# Patient Record
Sex: Male | Born: 1975 | Race: Black or African American | Hispanic: No | Marital: Married | Smoking: Former smoker
Health system: Southern US, Community
[De-identification: ages and names within clinical notes are randomized; demographics above are authoritative.]

## PROBLEM LIST (undated history)

## (undated) DIAGNOSIS — I10 Essential (primary) hypertension: Secondary | ICD-10-CM

## (undated) DIAGNOSIS — J45909 Unspecified asthma, uncomplicated: Secondary | ICD-10-CM

## (undated) DIAGNOSIS — E119 Type 2 diabetes mellitus without complications: Secondary | ICD-10-CM

## (undated) DIAGNOSIS — M1990 Unspecified osteoarthritis, unspecified site: Secondary | ICD-10-CM

## (undated) DIAGNOSIS — D649 Anemia, unspecified: Secondary | ICD-10-CM

## (undated) DIAGNOSIS — Z9289 Personal history of other medical treatment: Secondary | ICD-10-CM

## (undated) DIAGNOSIS — I209 Angina pectoris, unspecified: Secondary | ICD-10-CM

## (undated) DIAGNOSIS — G4730 Sleep apnea, unspecified: Secondary | ICD-10-CM

## (undated) HISTORY — PX: WISDOM TOOTH EXTRACTION: SHX21

## (undated) HISTORY — PX: BACK SURGERY: SHX140

---

## 1997-11-13 ENCOUNTER — Emergency Department (HOSPITAL_COMMUNITY): Admission: EM | Admit: 1997-11-13 | Discharge: 1997-11-13 | Payer: Self-pay | Admitting: Internal Medicine

## 1999-12-29 ENCOUNTER — Emergency Department (HOSPITAL_COMMUNITY): Admission: EM | Admit: 1999-12-29 | Discharge: 1999-12-29 | Payer: Self-pay | Admitting: Emergency Medicine

## 2003-07-08 HISTORY — PX: TONSILLECTOMY: SUR1361

## 2004-07-28 ENCOUNTER — Ambulatory Visit: Payer: Self-pay | Admitting: Otolaryngology

## 2005-02-02 ENCOUNTER — Emergency Department: Payer: Self-pay | Admitting: Emergency Medicine

## 2005-02-24 ENCOUNTER — Emergency Department: Payer: Self-pay | Admitting: Emergency Medicine

## 2010-10-21 ENCOUNTER — Emergency Department: Payer: Self-pay | Admitting: Emergency Medicine

## 2011-09-18 ENCOUNTER — Ambulatory Visit: Payer: Self-pay | Admitting: Internal Medicine

## 2011-09-18 LAB — LIPID PANEL
HDL Cholesterol: 20 mg/dL — ABNORMAL LOW (ref 40–60)
Triglycerides: 1798 mg/dL — ABNORMAL HIGH (ref 0–200)

## 2011-09-18 LAB — BASIC METABOLIC PANEL
Anion Gap: 16 (ref 7–16)
BUN: 18 mg/dL (ref 7–18)
Calcium, Total: 8.4 mg/dL — ABNORMAL LOW (ref 8.5–10.1)
Co2: 19 mmol/L — ABNORMAL LOW (ref 21–32)
Creatinine: 1.06 mg/dL (ref 0.60–1.30)
EGFR (African American): >60
EGFR (Non-African Amer.): >60
Glucose: 503 mg/dL (ref 65–99)
Sodium: 133 mmol/L — ABNORMAL LOW (ref 136–145)

## 2011-09-18 LAB — HEMOGLOBIN A1C: Hemoglobin A1C: 10.4 % — ABNORMAL HIGH (ref 4.2–6.3)

## 2012-05-28 ENCOUNTER — Other Ambulatory Visit: Payer: Self-pay | Admitting: Neurosurgery

## 2012-05-28 DIAGNOSIS — M48061 Spinal stenosis, lumbar region without neurogenic claudication: Secondary | ICD-10-CM

## 2012-06-04 ENCOUNTER — Ambulatory Visit
Admission: RE | Admit: 2012-06-04 | Discharge: 2012-06-04 | Disposition: A | Discharge status: Home / Self Care | Payer: BC Managed Care – PPO | Source: Ambulatory Visit | Attending: Neurosurgery | Admitting: Neurosurgery

## 2012-06-04 VITALS — BP 85/58 | HR 68 | Ht 74.0 in | Wt 300.0 lb

## 2012-06-04 DIAGNOSIS — M48061 Spinal stenosis, lumbar region without neurogenic claudication: Secondary | ICD-10-CM

## 2012-06-04 MED ORDER — DIAZEPAM 5 MG PO TABS
10.0000 mg | ORAL_TABLET | Freq: Once | ORAL | Status: AC
  Administered 2012-06-04: 10 mg via ORAL

## 2012-06-04 MED ORDER — ONDANSETRON HCL 4 MG/2ML IJ SOLN
4.0000 mg | Freq: Once | INTRAMUSCULAR | Status: AC
  Administered 2012-06-04: 4 mg via INTRAMUSCULAR

## 2012-06-04 MED ORDER — MEPERIDINE HCL 100 MG/ML IJ SOLN
100.0000 mg | Freq: Once | INTRAMUSCULAR | Status: AC
  Administered 2012-06-04: 100 mg via INTRAMUSCULAR

## 2012-06-04 MED ORDER — IOHEXOL 180 MG/ML  SOLN
18.0000 mL | Freq: Once | INTRAMUSCULAR | Status: AC | PRN
  Administered 2012-06-04: 18 mL via INTRATHECAL

## 2012-06-04 MED ORDER — ONDANSETRON HCL 4 MG/2ML IJ SOLN: 4.0000 mg | Freq: Four times a day (QID) | INTRAMUSCULAR | Status: DC | PRN

## 2012-06-04 NOTE — Progress Notes (Signed)
Dr. Karin Golden in to speak with patient and his wife in the nursing station after myelogram.  Donell Sievert, RN

## 2012-06-04 NOTE — Progress Notes (Signed)
Patient states he has been off Tramadol and Cymbalta for the past two days.  jkl

## 2012-06-04 NOTE — Discharge Instructions (Signed)
Myelogram Discharge Instructions  1. Go home and rest quietly for the next 24 hours.  It is important to lie flat for the next 24 hours.  Get up only to go to the restroom.  You may lie in the bed or on a couch on your back, your stomach, your left side or your right side.  You may have one pillow under your head.  You may have pillows between your knees while you are on your side or under your knees while you are on your back.  2. DO NOT drive today.  Recline the seat as far back as it will go, while still wearing your seat belt, on the way home.  3. You may get up to go to the bathroom as needed.  You may sit up for 10 minutes to eat.  You may resume your normal diet and medications unless otherwise indicated.  Drink lots of extra fluids today and tomorrow.  4. The incidence of headache, nausea, or vomiting is about 5% (one in 20 patients).  If you develop a headache, lie flat and drink plenty of fluids until the headache goes away.  Caffeinated beverages may be helpful.  If you develop severe nausea and vomiting or a headache that does not go away with flat bed rest, call [phone].  5. You may resume normal activities after your 24 hours of bed rest is over; however, do not exert yourself strongly or do any heavy lifting tomorrow. If when you get up you have a headache when standing, go back to bed and force fluids for another 24 hours.  6. Call your physician for a follow-up appointment.  The results of your myelogram will be sent directly to your physician by the following day.  7. If you have any questions or if complications develop after you arrive home, please call [phone].  Discharge instructions have been explained to the patient.  The patient, or the person responsible for the patient, fully understands these instructions.       MAY RESUME CYMBALTA AND TRAMADOL ON June 05, 2012, AFTER 9:30 AM.

## 2012-07-06 ENCOUNTER — Other Ambulatory Visit: Payer: Self-pay | Admitting: Neurosurgery

## 2012-07-16 ENCOUNTER — Encounter (HOSPITAL_COMMUNITY): Payer: Self-pay

## 2012-07-20 ENCOUNTER — Encounter (HOSPITAL_COMMUNITY): Payer: Self-pay

## 2012-07-20 ENCOUNTER — Encounter (HOSPITAL_COMMUNITY)
Admission: RE | Admit: 2012-07-20 | Discharge: 2012-07-20 | Disposition: A | Discharge status: Home / Self Care | Payer: BC Managed Care – PPO | Source: Ambulatory Visit | Attending: Neurosurgery | Admitting: Neurosurgery

## 2012-07-20 DIAGNOSIS — Z01812 Encounter for preprocedural laboratory examination: Secondary | ICD-10-CM | POA: Insufficient documentation

## 2012-07-20 DIAGNOSIS — Z01818 Encounter for other preprocedural examination: Secondary | ICD-10-CM | POA: Insufficient documentation

## 2012-07-20 HISTORY — DX: Anemia, unspecified: D64.9

## 2012-07-20 HISTORY — DX: Essential (primary) hypertension: I10

## 2012-07-20 HISTORY — DX: Unspecified osteoarthritis, unspecified site: M19.90

## 2012-07-20 HISTORY — DX: Type 2 diabetes mellitus without complications: E11.9

## 2012-07-20 LAB — CBC WITH DIFFERENTIAL/PLATELET
Basophils Absolute: 0.1 10*3/uL (ref 0.0–0.1)
Basophils Relative: 1 % (ref 0–1)
Eosinophils Absolute: 0.3 10*3/uL (ref 0.0–0.7)
Eosinophils Relative: 4 % (ref 0–5)
HCT: 42.4 % (ref 39.0–52.0)
MCH: 31.2 pg (ref 26.0–34.0)
MCHC: 33.3 g/dL (ref 30.0–36.0)
MCV: 93.8 fL (ref 78.0–100.0)
Monocytes Absolute: 0.6 10*3/uL (ref 0.1–1.0)
Platelets: 326 10*3/uL (ref 150–400)
RDW: 13.2 % (ref 11.5–15.5)

## 2012-07-20 LAB — BASIC METABOLIC PANEL
CO2: 30 mEq/L (ref 19–32)
Calcium: 9 mg/dL (ref 8.4–10.5)
Creatinine, Ser: 1.11 mg/dL (ref 0.50–1.35)
GFR calc non Af Amer: 82 mL/min — ABNORMAL LOW (ref >90)

## 2012-07-20 LAB — TYPE AND SCREEN: ABO/RH(D): O POS

## 2012-07-20 NOTE — Pre-Procedure Instructions (Signed)
[name]  07/20/2012   Your procedure is scheduled on:  07-30-2012  Report to [facility] Stay [facility] at 8:00 AM. Take the [facility] to the 3rd floor  Call this number if you have problems the morning of surgery: [phone]   Remember:   Do not eat food or drink liquids after midnight.              Eat A good snack before bedtime on sunday    Take these medicines the morning of surgery with A SIP OF WATER cymbalta,tramadol if needed   Do not wear jewelry,  Do not wear lotions, powders, or perfumes. You may wear deodorant.  Do not shave 48 hours prior to surgery. Men may shave face and neck.  Do not bring valuables to the hospital.  Contacts, dentures or bridgework may not be worn into surgery.  Leave suitcase in the car. After surgery it may be brought to your room.    For patients admitted to the hospital, checkout time is 11:00 AM the day of discharge.   Patients discharged the day of surgery will not be allowed to drive home.    Special Instructions: Shower using CHG 2 nights before surgery and the night before surgery.  If you shower the day of surgery use CHG.  Use special wash - you have one bottle of CHG for all showers.  You should use approximately 1/3 of the bottle for each shower.   Please read over the following fact sheets that you were given: Pain Booklet, Coughing and Deep Breathing, Blood Transfusion Information and Anesthesia Post-op Instructions

## 2012-07-20 NOTE — Progress Notes (Addendum)
Request ekg and cxr ,stress test requested fromDr. Adrian Blackwater ,[facility], [place]  [phone]

## 2012-07-24 NOTE — Progress Notes (Signed)
Spoke with [facility] records and pt has not had a CXR and last EKG is from 2012. Received the stress test, echo and office visit notes and they will send the EKG that they have.  Will need CXR and EKG DOS...noted on chart

## 2012-07-29 MED ORDER — DEXTROSE 5 % IV SOLN
3.0000 g | INTRAVENOUS | Status: AC
  Administered 2012-07-30: 3 g via INTRAVENOUS
  Filled 2012-07-29: qty 3000

## 2012-07-30 ENCOUNTER — Ambulatory Visit (HOSPITAL_COMMUNITY): Payer: BC Managed Care – PPO | Admitting: Certified Registered"

## 2012-07-30 ENCOUNTER — Ambulatory Visit (HOSPITAL_COMMUNITY): Payer: BC Managed Care – PPO

## 2012-07-30 ENCOUNTER — Encounter (HOSPITAL_COMMUNITY)
Admission: RE | Disposition: A | Discharge status: Home / Self Care | Payer: Self-pay | Source: Ambulatory Visit | Attending: Neurosurgery

## 2012-07-30 ENCOUNTER — Encounter (HOSPITAL_COMMUNITY): Payer: Self-pay | Admitting: Surgery

## 2012-07-30 ENCOUNTER — Inpatient Hospital Stay (HOSPITAL_COMMUNITY)
Admission: RE | Admit: 2012-07-30 | Discharge: 2012-08-02 | DRG: 756 | Disposition: A | Discharge status: Home / Self Care | Payer: BC Managed Care – PPO | Source: Ambulatory Visit | Attending: Neurosurgery | Admitting: Neurosurgery

## 2012-07-30 ENCOUNTER — Encounter (HOSPITAL_COMMUNITY): Payer: Self-pay | Admitting: Certified Registered"

## 2012-07-30 DIAGNOSIS — Z9089 Acquired absence of other organs: Secondary | ICD-10-CM

## 2012-07-30 DIAGNOSIS — M129 Arthropathy, unspecified: Secondary | ICD-10-CM | POA: Diagnosis present

## 2012-07-30 DIAGNOSIS — Z7982 Long term (current) use of aspirin: Secondary | ICD-10-CM

## 2012-07-30 DIAGNOSIS — F17200 Nicotine dependence, unspecified, uncomplicated: Secondary | ICD-10-CM | POA: Diagnosis present

## 2012-07-30 DIAGNOSIS — E119 Type 2 diabetes mellitus without complications: Secondary | ICD-10-CM | POA: Diagnosis present

## 2012-07-30 DIAGNOSIS — Z79899 Other long term (current) drug therapy: Secondary | ICD-10-CM

## 2012-07-30 DIAGNOSIS — I10 Essential (primary) hypertension: Secondary | ICD-10-CM | POA: Diagnosis present

## 2012-07-30 DIAGNOSIS — M48062 Spinal stenosis, lumbar region with neurogenic claudication: Principal | ICD-10-CM | POA: Diagnosis present

## 2012-07-30 LAB — GLUCOSE, CAPILLARY: Glucose-Capillary: 146 mg/dL — ABNORMAL HIGH (ref 70–99)

## 2012-07-30 SURGERY — POSTERIOR LUMBAR FUSION 2 LEVEL
Anesthesia: General | Site: Back | Wound class: Clean

## 2012-07-30 MED ORDER — ROCURONIUM BROMIDE 100 MG/10ML IV SOLN
INTRAVENOUS | Status: DC | PRN
  Administered 2012-07-30: 50 mg via INTRAVENOUS

## 2012-07-30 MED ORDER — 0.9 % SODIUM CHLORIDE (POUR BTL) OPTIME
TOPICAL | Status: DC | PRN
  Administered 2012-07-30: 1000 mL

## 2012-07-30 MED ORDER — CEFAZOLIN SODIUM 1-5 GM-% IV SOLN
1.0000 g | Freq: Three times a day (TID) | INTRAVENOUS | Status: AC
  Administered 2012-07-30 – 2012-07-31 (×2): 1 g via INTRAVENOUS
  Filled 2012-07-30 (×2): qty 50

## 2012-07-30 MED ORDER — FLEET ENEMA 7-19 GM/118ML RE ENEM
1.0000 | ENEMA | Freq: Once | RECTAL | Status: AC | PRN
  Filled 2012-07-30: qty 1

## 2012-07-30 MED ORDER — PHENOL 1.4 % MT LIQD: 1.0000 | OROMUCOSAL | Status: DC | PRN

## 2012-07-30 MED ORDER — ONDANSETRON HCL 4 MG/2ML IJ SOLN: 4.0000 mg | Freq: Once | INTRAMUSCULAR | Status: DC | PRN

## 2012-07-30 MED ORDER — DIAZEPAM 5 MG PO TABS
5.0000 mg | ORAL_TABLET | Freq: Four times a day (QID) | ORAL | Status: DC | PRN
  Administered 2012-07-30: 5 mg via ORAL
  Administered 2012-07-31 – 2012-08-02 (×8): 10 mg via ORAL
  Filled 2012-07-30 (×7): qty 2
  Filled 2012-07-30: qty 1
  Filled 2012-07-30: qty 2

## 2012-07-30 MED ORDER — ONDANSETRON HCL 4 MG/2ML IJ SOLN
INTRAMUSCULAR | Status: DC | PRN
  Administered 2012-07-30: 4 mg via INTRAVENOUS

## 2012-07-30 MED ORDER — NEOSTIGMINE METHYLSULFATE 1 MG/ML IJ SOLN
INTRAMUSCULAR | Status: DC | PRN
  Administered 2012-07-30: 2.5 mg via INTRAVENOUS

## 2012-07-30 MED ORDER — SODIUM CHLORIDE 0.9 % IV SOLN: 250.0000 mL | INTRAVENOUS | Status: DC

## 2012-07-30 MED ORDER — LIDOCAINE HCL (CARDIAC) 20 MG/ML IV SOLN
INTRAVENOUS | Status: DC | PRN
  Administered 2012-07-30: 100 mg via INTRAVENOUS

## 2012-07-30 MED ORDER — HYDROCODONE-ACETAMINOPHEN 5-325 MG PO TABS: 1.0000 | ORAL_TABLET | ORAL | Status: DC | PRN

## 2012-07-30 MED ORDER — PROPOFOL 10 MG/ML IV BOLUS
INTRAVENOUS | Status: DC | PRN
  Administered 2012-07-30: 400 mg via INTRAVENOUS

## 2012-07-30 MED ORDER — ACETAMINOPHEN 325 MG PO TABS: 650.0000 mg | ORAL_TABLET | ORAL | Status: DC | PRN

## 2012-07-30 MED ORDER — POLYETHYLENE GLYCOL 3350 17 G PO PACK
17.0000 g | PACK | Freq: Every day | ORAL | Status: DC | PRN
  Administered 2012-08-01: 17 g via ORAL
  Filled 2012-07-30: qty 1

## 2012-07-30 MED ORDER — BACITRACIN 50000 UNITS IM SOLR
INTRAMUSCULAR | Status: AC
  Filled 2012-07-30: qty 1

## 2012-07-30 MED ORDER — ONDANSETRON HCL 4 MG/2ML IJ SOLN: 4.0000 mg | INTRAMUSCULAR | Status: DC | PRN

## 2012-07-30 MED ORDER — SODIUM CHLORIDE 0.9 % IJ SOLN
3.0000 mL | INTRAMUSCULAR | Status: DC | PRN
Start: 2012-07-30 — End: 2012-08-02

## 2012-07-30 MED ORDER — DEXAMETHASONE SODIUM PHOSPHATE 10 MG/ML IJ SOLN
10.0000 mg | INTRAMUSCULAR | Status: AC
  Administered 2012-07-30: 10 mg via INTRAVENOUS
  Filled 2012-07-30: qty 1

## 2012-07-30 MED ORDER — SENNA 8.6 MG PO TABS
1.0000 | ORAL_TABLET | Freq: Two times a day (BID) | ORAL | Status: DC
  Administered 2012-07-30 – 2012-08-02 (×6): 8.6 mg via ORAL
  Filled 2012-07-30 (×9): qty 1

## 2012-07-30 MED ORDER — SODIUM CHLORIDE 0.9 % IR SOLN
Status: DC | PRN
  Administered 2012-07-30: 11:00:00

## 2012-07-30 MED ORDER — DULOXETINE HCL 60 MG PO CPEP
60.0000 mg | ORAL_CAPSULE | Freq: Every day | ORAL | Status: DC
  Administered 2012-07-30 – 2012-08-02 (×4): 60 mg via ORAL
  Filled 2012-07-30 (×4): qty 1

## 2012-07-30 MED ORDER — FENTANYL CITRATE 0.05 MG/ML IJ SOLN
INTRAMUSCULAR | Status: DC | PRN
  Administered 2012-07-30 (×6): 50 ug via INTRAVENOUS
  Administered 2012-07-30: 200 ug via INTRAVENOUS

## 2012-07-30 MED ORDER — BISACODYL 10 MG RE SUPP: 10.0000 mg | Freq: Every day | RECTAL | Status: DC | PRN

## 2012-07-30 MED ORDER — MENTHOL 3 MG MT LOZG: 1.0000 | LOZENGE | OROMUCOSAL | Status: DC | PRN

## 2012-07-30 MED ORDER — LACTATED RINGERS IV SOLN
INTRAVENOUS | Status: DC | PRN
  Administered 2012-07-30 (×2): via INTRAVENOUS

## 2012-07-30 MED ORDER — METFORMIN HCL 500 MG PO TABS
1000.0000 mg | ORAL_TABLET | Freq: Every day | ORAL | Status: DC
  Administered 2012-07-31 – 2012-08-02 (×3): 1000 mg via ORAL
  Filled 2012-07-30 (×4): qty 2

## 2012-07-30 MED ORDER — SODIUM CHLORIDE 0.9 % IV SOLN
INTRAVENOUS | Status: DC | PRN
  Administered 2012-07-30: 13:00:00 via INTRAVENOUS

## 2012-07-30 MED ORDER — VECURONIUM BROMIDE 10 MG IV SOLR
INTRAVENOUS | Status: DC | PRN
  Administered 2012-07-30 (×3): 2 mg via INTRAVENOUS

## 2012-07-30 MED ORDER — ALUM & MAG HYDROXIDE-SIMETH 200-200-20 MG/5ML PO SUSP
30.0000 mL | Freq: Four times a day (QID) | ORAL | Status: DC | PRN
  Administered 2012-07-31: 30 mL via ORAL

## 2012-07-30 MED ORDER — SUCCINYLCHOLINE CHLORIDE 20 MG/ML IJ SOLN
INTRAMUSCULAR | Status: DC | PRN
  Administered 2012-07-30: 160 mg via INTRAVENOUS

## 2012-07-30 MED ORDER — THROMBIN 5000 UNITS EX SOLR
CUTANEOUS | Status: DC | PRN
  Administered 2012-07-30 (×2): 5000 [IU] via TOPICAL

## 2012-07-30 MED ORDER — OXYCODONE-ACETAMINOPHEN 5-325 MG PO TABS
1.0000 | ORAL_TABLET | ORAL | Status: DC | PRN
  Administered 2012-07-30 – 2012-08-02 (×11): 2 via ORAL
  Filled 2012-07-30 (×11): qty 2

## 2012-07-30 MED ORDER — HYDROMORPHONE HCL PF 1 MG/ML IJ SOLN
0.2500 mg | INTRAMUSCULAR | Status: DC | PRN
  Administered 2012-07-30 (×4): 0.5 mg via INTRAVENOUS

## 2012-07-30 MED ORDER — ARTIFICIAL TEARS OP OINT
TOPICAL_OINTMENT | OPHTHALMIC | Status: DC | PRN
  Administered 2012-07-30: 1 via OPHTHALMIC

## 2012-07-30 MED ORDER — HYDROMORPHONE HCL PF 1 MG/ML IJ SOLN
INTRAMUSCULAR | Status: AC
  Filled 2012-07-30: qty 1

## 2012-07-30 MED ORDER — SODIUM CHLORIDE 0.9 % IJ SOLN
3.0000 mL | Freq: Two times a day (BID) | INTRAMUSCULAR | Status: DC
  Administered 2012-07-31 – 2012-08-02 (×5): 3 mL via INTRAVENOUS

## 2012-07-30 MED ORDER — HEMOSTATIC AGENTS (NO CHARGE) OPTIME
TOPICAL | Status: DC | PRN
  Administered 2012-07-30: 1 via TOPICAL

## 2012-07-30 MED ORDER — ACETAMINOPHEN 650 MG RE SUPP: 650.0000 mg | RECTAL | Status: DC | PRN

## 2012-07-30 MED ORDER — BUPIVACAINE HCL (PF) 0.25 % IJ SOLN
INTRAMUSCULAR | Status: DC | PRN
  Administered 2012-07-30: 30 mL

## 2012-07-30 MED ORDER — SODIUM CHLORIDE 0.9 % IV SOLN
INTRAVENOUS | Status: AC
  Filled 2012-07-30: qty 500

## 2012-07-30 MED ORDER — THROMBIN 20000 UNITS EX SOLR
CUTANEOUS | Status: DC | PRN
  Administered 2012-07-30: 11:00:00 via TOPICAL

## 2012-07-30 MED ORDER — ZOLPIDEM TARTRATE 5 MG PO TABS: 5.0000 mg | ORAL_TABLET | Freq: Every evening | ORAL | Status: DC | PRN

## 2012-07-30 MED ORDER — GLYCOPYRROLATE 0.2 MG/ML IJ SOLN
INTRAMUSCULAR | Status: DC | PRN
  Administered 2012-07-30: 0.4 mg via INTRAVENOUS
  Administered 2012-07-30: 0.2 mg via INTRAVENOUS

## 2012-07-30 MED ORDER — HYDROMORPHONE HCL PF 1 MG/ML IJ SOLN
0.5000 mg | INTRAMUSCULAR | Status: DC | PRN
  Administered 2012-07-30: 1 mg via INTRAVENOUS
  Filled 2012-07-30: qty 1

## 2012-07-30 MED ORDER — TRIAMTERENE-HCTZ 75-50 MG PO TABS
1.0000 | ORAL_TABLET | Freq: Every day | ORAL | Status: DC
  Administered 2012-07-30 – 2012-08-02 (×4): 1 via ORAL
  Filled 2012-07-30 (×4): qty 1

## 2012-07-30 MED FILL — Heparin Sodium (Porcine) Inj 1000 Unit/ML: INTRAMUSCULAR | Qty: 30 | Status: AC

## 2012-07-30 MED FILL — Sodium Chloride Irrigation Soln 0.9%: Qty: 3000 | Status: AC

## 2012-07-30 MED FILL — Sodium Chloride IV Soln 0.9%: INTRAVENOUS | Qty: 1000 | Status: AC

## 2012-07-30 SURGICAL SUPPLY — 73 items
ADH SKN CLS APL DERMABOND .7 (GAUZE/BANDAGES/DRESSINGS) ×1
ADH SKN CLS LQ APL DERMABOND (GAUZE/BANDAGES/DRESSINGS) ×1
APL SKNCLS STERI-STRIP NONHPOA (GAUZE/BANDAGES/DRESSINGS) ×1
BAG DECANTER FOR FLEXI CONT (MISCELLANEOUS) ×2 IMPLANT
BENZOIN TINCTURE PRP APPL 2/3 (GAUZE/BANDAGES/DRESSINGS) ×2 IMPLANT
BLADE SURG ROTATE 9660 (MISCELLANEOUS) ×1 IMPLANT
BRUSH SCRUB EZ PLAIN DRY (MISCELLANEOUS) ×2 IMPLANT
BUR MATCHSTICK NEURO 3.0 LAGG (BURR) ×2 IMPLANT
CAGE 10X22 (Cage) ×2 IMPLANT
CANISTER SUCTION 2500CC (MISCELLANEOUS) ×2 IMPLANT
CAP LCK SPNE (Orthopedic Implant) ×6 IMPLANT
CAP LOCK SPINE RADIUS (Orthopedic Implant) IMPLANT
CAP LOCKING (Orthopedic Implant) ×12 IMPLANT
CLOTH BEACON ORANGE TIMEOUT ST (SAFETY) ×2 IMPLANT
CONT SPEC 4OZ CLIKSEAL STRL BL (MISCELLANEOUS) ×4 IMPLANT
COVER BACK TABLE 24X17X13 BIG (DRAPES) IMPLANT
COVER TABLE BACK 60X90 (DRAPES) ×2 IMPLANT
CROSSLINK MEDIUM (Orthopedic Implant) ×1 IMPLANT
DECANTER SPIKE VIAL GLASS SM (MISCELLANEOUS) ×2 IMPLANT
DERMABOND ADHESIVE PROPEN (GAUZE/BANDAGES/DRESSINGS) ×1
DERMABOND ADVANCED (GAUZE/BANDAGES/DRESSINGS) ×1
DERMABOND ADVANCED .7 DNX12 (GAUZE/BANDAGES/DRESSINGS) ×1 IMPLANT
DERMABOND ADVANCED .7 DNX6 (GAUZE/BANDAGES/DRESSINGS) IMPLANT
DRAPE C-ARM 42X72 X-RAY (DRAPES) ×4 IMPLANT
DRAPE LAPAROTOMY 100X72X124 (DRAPES) ×2 IMPLANT
DRAPE POUCH INSTRU U-SHP 10X18 (DRAPES) ×2 IMPLANT
DRAPE PROXIMA HALF (DRAPES) IMPLANT
DRAPE SURG 17X23 STRL (DRAPES) ×8 IMPLANT
ELECT REM PT RETURN 9FT ADLT (ELECTROSURGICAL) ×2
ELECTRODE REM PT RTRN 9FT ADLT (ELECTROSURGICAL) ×1 IMPLANT
EVACUATOR 1/8 PVC DRAIN (DRAIN) ×2 IMPLANT
GAUZE SPONGE 4X4 16PLY XRAY LF (GAUZE/BANDAGES/DRESSINGS) ×1 IMPLANT
GLOVE BIOGEL PI IND STRL 7.0 (GLOVE) IMPLANT
GLOVE BIOGEL PI IND STRL 8 (GLOVE) IMPLANT
GLOVE BIOGEL PI INDICATOR 7.0 (GLOVE) ×1
GLOVE BIOGEL PI INDICATOR 8 (GLOVE) ×1
GLOVE ECLIPSE 7.5 STRL STRAW (GLOVE) ×1 IMPLANT
GLOVE ECLIPSE 8.5 STRL (GLOVE) ×4 IMPLANT
GLOVE EXAM NITRILE LRG STRL (GLOVE) IMPLANT
GLOVE EXAM NITRILE MD LF STRL (GLOVE) ×2 IMPLANT
GLOVE EXAM NITRILE XL STR (GLOVE) IMPLANT
GLOVE EXAM NITRILE XS STR PU (GLOVE) IMPLANT
GLOVE SS BIOGEL STRL SZ 6.5 (GLOVE) IMPLANT
GLOVE SUPERSENSE BIOGEL SZ 6.5 (GLOVE) ×3
GOWN BRE IMP SLV AUR LG STRL (GOWN DISPOSABLE) ×1 IMPLANT
GOWN BRE IMP SLV AUR XL STRL (GOWN DISPOSABLE) ×5 IMPLANT
GOWN STRL REIN 2XL LVL4 (GOWN DISPOSABLE) IMPLANT
KIT BASIN OR (CUSTOM PROCEDURE TRAY) ×2 IMPLANT
KIT ROOM TURNOVER OR (KITS) ×2 IMPLANT
MILL MEDIUM DISP (BLADE) ×1 IMPLANT
NEEDLE HYPO 22GX1.5 SAFETY (NEEDLE) ×2 IMPLANT
NS IRRIG 1000ML POUR BTL (IV SOLUTION) ×2 IMPLANT
PACK LAMINECTOMY NEURO (CUSTOM PROCEDURE TRAY) ×2 IMPLANT
ROD 70MM (Rod) ×4 IMPLANT
ROD SPNL 70X5.5 NS TI RDS (Rod) IMPLANT
ROD SPNL 70X5.5XNS TI RDS (Rod) IMPLANT
SCREW 6.75X40MM (Screw) ×1 IMPLANT
SCREW 6.75X45MM (Screw) ×2 IMPLANT
SPONGE GAUZE 4X4 12PLY (GAUZE/BANDAGES/DRESSINGS) ×2 IMPLANT
SPONGE SURGIFOAM ABS GEL 100 (HEMOSTASIS) ×2 IMPLANT
SPONGE SURGIFOAM ABS GEL SZ50 (HEMOSTASIS) ×1 IMPLANT
STRIP CLOSURE SKIN 1/2X4 (GAUZE/BANDAGES/DRESSINGS) ×3 IMPLANT
SUT VIC AB 0 CT1 18XCR BRD8 (SUTURE) ×2 IMPLANT
SUT VIC AB 0 CT1 8-18 (SUTURE) ×2
SUT VIC AB 2-0 CT1 18 (SUTURE) ×3 IMPLANT
SUT VIC AB 3-0 SH 8-18 (SUTURE) ×4 IMPLANT
SYR 20ML ECCENTRIC (SYRINGE) ×2 IMPLANT
TAPE CLOTH SURG 4X10 WHT LF (GAUZE/BANDAGES/DRESSINGS) ×1 IMPLANT
TOWEL OR 17X24 6PK STRL BLUE (TOWEL DISPOSABLE) ×2 IMPLANT
TOWEL OR 17X26 10 PK STRL BLUE (TOWEL DISPOSABLE) ×2 IMPLANT
TRAY FOLEY CATH 14FRSI W/METER (CATHETERS) ×2 IMPLANT
WATER STERILE IRR 1000ML POUR (IV SOLUTION) ×2 IMPLANT
WEDGE TANGENT 10X26MM ×2 IMPLANT

## 2012-07-30 NOTE — Anesthesia Preprocedure Evaluation (Addendum)
Anesthesia Evaluation  Patient identified by MRN, date of birth, ID band Patient awake    Reviewed: Allergy & Precautions, H&P , NPO status , Patient's Chart, lab work & pertinent test results  Airway Mallampati: I TM Distance: >3 FB Neck ROM: Full    Dental  (+) Teeth Intact and Dental Advisory Given   Pulmonary          Cardiovascular hypertension, Pt. on medications Rhythm:Regular Rate:Normal     Neuro/Psych    GI/Hepatic   Endo/Other  diabetes, Well Controlled, Type 2, Oral Hypoglycemic Agents  Renal/GU      Musculoskeletal   Abdominal   Peds  Hematology   Anesthesia Other Findings   Reproductive/Obstetrics                          Anesthesia Physical Anesthesia Plan  ASA: III  Anesthesia Plan: General   Post-op Pain Management:    Induction: Intravenous  Airway Management Planned: Oral ETT  Additional Equipment:   Intra-op Plan:   Post-operative Plan: Extubation in OR  Informed Consent: I have reviewed the patients History and Physical, chart, labs and discussed the procedure including the risks, benefits and alternatives for the proposed anesthesia with the patient or authorized representative who has indicated his/her understanding and acceptance.     Plan Discussed with: CRNA, Anesthesiologist and Surgeon  Anesthesia Plan Comments:         Anesthesia Quick Evaluation

## 2012-07-30 NOTE — H&P (Signed)
[name] is an 37 y.o. male.   Chief Complaint: Severe back and bilateral leg pain. HPI: 37 year old male with severe back and bilateral lower extremity symptoms consistent with neurogenic claudication. Workup demonstrates transitional anatomy at the lumbosacral junction. Patient has marked disc space degeneration with severe stenosis at L3-4 and L4-5 causing neurogenic claudication affecting the L3-L4 and L5 nerve roots bilaterally. Patient has failed conservative management and presents now for operative decompression infusion in hopes of improving his symptoms.  Past Medical History  Diagnosis Date  . Hypertension   . Diabetes mellitus without complication   . Arthritis   . Anemia     Past Surgical History  Procedure Laterality Date  . Tonsillectomy  2005     for sleep apnea  . Wisdom tooth extraction      History reviewed. No pertinent family history. Social History:  reports that he has been smoking Cigarettes.  He has a 10 pack-year smoking history. He does not have any smokeless tobacco history on file. He reports that he does not drink alcohol or use illicit drugs.  Allergies:  Allergies  Allergen Reactions  . Bee Venom Swelling    Swelling primarily at sting site, but can be all over.  Epi pen prn    Medications Prior to Admission  Medication Sig Dispense Refill  . Aspirin-Caffeine 1000-65 MG PACK Take 2 tablets by mouth daily.      . diclofenac (VOLTAREN) 75 MG EC tablet Take 75 mg by mouth 2 (two) times daily.      . DULoxetine (CYMBALTA) 60 MG capsule Take 60 mg by mouth daily.      . metFORMIN (GLUCOPHAGE) 1000 MG tablet Take 1,000 mg by mouth daily with breakfast.      . Omega-3 Fatty Acids (FISH OIL) 1000 MG CAPS Take 3,000 mg by mouth daily.      . traMADol (ULTRAM) 50 MG tablet Take 50-100 mg by mouth every 6 (six) hours as needed for pain.      Marland Kitchen triamterene-hydrochlorothiazide (MAXZIDE) 75-50 MG per tablet Take 1 tablet by mouth daily.        Results  for orders placed during the hospital encounter of 07/30/12 (from the past 48 hour(s))  GLUCOSE, CAPILLARY     Status: None   Collection Time    07/30/12  8:38 AM      Result Value Range   Glucose-Capillary 90  70 - 99 mg/dL   Dg Chest 2 View  8/41/3244  *RADIOLOGY REPORT*  Clinical Data: Preoperative evaluation for low back surgery.  CHEST - 2 VIEW  Comparison: No priors.  Findings: Lung volumes are normal.  No consolidative airspace disease.  No pleural effusions.  No pneumothorax.  No pulmonary nodule or mass noted.  Pulmonary vasculature and the cardiomediastinal silhouette are within normal limits.  IMPRESSION: 1. No radiographic evidence of acute cardiopulmonary disease.   Original Report Authenticated By: Trudie Reed, M.D.     Review of Systems  Constitutional: Negative.   HENT: Negative.   Eyes: Negative.   Respiratory: Negative.   Cardiovascular: Negative.   Gastrointestinal: Negative.   Genitourinary: Negative.   Musculoskeletal: Negative.   Skin: Negative.   Neurological: Negative.   Endo/Heme/Allergies: Negative.   Psychiatric/Behavioral: Negative.     Blood pressure 109/75, pulse 72, temperature 98 F (36.7 C), temperature source Oral, resp. rate 20, SpO2 96.00%. Physical Exam  Constitutional: He is oriented to person, place, and time. He appears well-developed and well-nourished. No distress.  HENT:  Head: Normocephalic and atraumatic.  Right Ear: External ear normal.  Left Ear: External ear normal.  Nose: Nose normal.  Mouth/Throat: Oropharynx is clear and moist.  Eyes: Conjunctivae and EOM are normal. Pupils are equal, round, and reactive to light. Right eye exhibits no discharge. Left eye exhibits no discharge.  Neck: Normal range of motion. Neck supple. No tracheal deviation present. No thyromegaly present.  Cardiovascular: Normal rate, regular rhythm, normal heart sounds and intact distal pulses.  Exam reveals no friction rub.   No murmur  heard. Respiratory: Effort normal and breath sounds normal. No respiratory distress. He has no wheezes.  GI: Soft. Bowel sounds are normal. He exhibits no distension. There is no tenderness.  Musculoskeletal: Normal range of motion. He exhibits no edema and no tenderness.  Neurological: He is alert and oriented to person, place, and time. He has normal reflexes. No cranial nerve deficit. Coordination normal.  Skin: Skin is warm and dry. No rash noted. He is not diaphoretic. No erythema. No pallor.  Psychiatric: He has a normal mood and affect. His behavior is normal. Judgment and thought content normal.     Assessment/Plan L3-4, L4-5 stenosis with neurogenic claudication. Plan L3-4 and L4-5 decompressive laminectomies with foraminotomies followed by posterior lumbar interbody fusion utilizing tangent interbody allograft wedge Telamon interbody peek cage and local autograft. This will be coupled with posterior lateral arthrodesis utilizing segmental pedicle screw sedation and local autograft. Risks and benefits have been explained. Patient wishes to proceed.  [name] A 07/30/2012, 10:15 AM

## 2012-07-30 NOTE — Preoperative (Signed)
Beta Blockers   Reason not to administer Beta Blockers:Not Applicable 

## 2012-07-30 NOTE — Anesthesia Procedure Notes (Signed)
Procedure Name: Intubation Date/Time: 07/30/2012 10:40 AM Performed by: Jefm Miles E Pre-anesthesia Checklist: Patient identified, Timeout performed, Emergency Drugs available, Suction available and Patient being monitored Patient Re-evaluated:Patient Re-evaluated prior to inductionOxygen Delivery Method: Circle system utilized Preoxygenation: Pre-oxygenation with 100% oxygen Intubation Type: IV induction and Rapid sequence Laryngoscope Size: Mac and 4 Grade View: Grade III Tube type: Oral Tube size: 7.5 mm Number of attempts: 1 Airway Equipment and Method: Stylet Placement Confirmation: ETT inserted through vocal cords under direct vision,  breath sounds checked- equal and bilateral and positive ETCO2 Secured at: 24 cm Tube secured with: Tape Dental Injury: Teeth and Oropharynx as per pre-operative assessment

## 2012-07-30 NOTE — Anesthesia Postprocedure Evaluation (Signed)
  Anesthesia Post-op Note  Patient: [name]  Procedure(s) Performed: Procedure(s) with comments: POSTERIOR LUMBAR FUSION 2 LEVEL (N/A) - Posterior Lumbar Interbody Fusion Lumbar Three-Four, Four-Five  Patient Location: PACU  Anesthesia Type:General  Level of Consciousness: awake, oriented and patient cooperative  Airway and Oxygen Therapy: Patient Spontanous Breathing  Post-op Pain: mild  Post-op Assessment: Post-op Vital signs reviewed, Patient's Cardiovascular Status Stable, Respiratory Function Stable, Patent Airway, No signs of Nausea or vomiting and Pain level controlled  Post-op Vital Signs: stable  Complications: No apparent anesthesia complications

## 2012-07-30 NOTE — Brief Op Note (Signed)
07/30/2012  2:59 PM  PATIENT:  [name]  37 y.o. male  PRE-OPERATIVE DIAGNOSIS:  listhesis/stenosis  POST-OPERATIVE DIAGNOSIS:  listhesis/stenosis  PROCEDURE:  Procedure(s) with comments: POSTERIOR LUMBAR FUSION 2 LEVEL (N/A) - Posterior Lumbar Interbody Fusion Lumbar Three-Four, Four-Five  SURGEON:  Surgeon(s) and Role:    * Temple Pacini, MD - Primary    * Hewitt Shorts, MD - Assisting  PHYSICIAN ASSISTANT:   ASSISTANTS:    ANESTHESIA:   general  EBL:  Total I/O In: 1225 [I.V.:1100; Blood:125] Out: 950 [Urine:250; Blood:700]  BLOOD ADMINISTERED:none  DRAINS: (Medium) Hemovact drain(s) in the Epidural space with  Suction Open   LOCAL MEDICATIONS USED:  MARCAINE     SPECIMEN:  No Specimen  DISPOSITION OF SPECIMEN:  N/A  COUNTS:  YES  TOURNIQUET:  * No tourniquets in log *  DICTATION: .Dragon Dictation  PLAN OF CARE: Admit to inpatient   PATIENT DISPOSITION:  PACU - hemodynamically stable.   Delay start of Pharmacological VTE agent (>24hrs) due to surgical blood loss or risk of bleeding: yes

## 2012-07-30 NOTE — Transfer of Care (Signed)
Immediate Anesthesia Transfer of Care Note  Patient: [name]  Procedure(s) Performed: Procedure(s) with comments: POSTERIOR LUMBAR FUSION 2 LEVEL (N/A) - Posterior Lumbar Interbody Fusion Lumbar Three-Four, Four-Five  Patient Location: PACU  Anesthesia Type:General  Level of Consciousness: awake, alert  and oriented  Airway & Oxygen Therapy: Patient Spontanous Breathing and Patient connected to face mask oxygen  Post-op Assessment: Report given to PACU RN and Patient moving all extremities X 4  Post vital signs: Reviewed and stable  Complications: No apparent anesthesia complications

## 2012-07-30 NOTE — Op Note (Signed)
Date of procedure: 07/30/2012  Date of dictation: Same  Service: Neurosurgery  Preoperative diagnosis: L3-4, L4-5 stenosis with neurogenic claudication affecting bilateral L3-L4 and L5 nerve roots.  Postoperative diagnosis: Same  Procedure Name: L3-4, L4-5 decompressive laminectomy with bilateral L3-L4 and L5 decompressive foraminotomies; more than would be required for simple interbody fusion alone.  L3-4, L4-5 posterior lumbar interbody fusion utilizing tangent interbody allograft wedge Telamon interbody peek cage and local autograft.  L3-4-5 posterior lateral arthrodesis utilizing segmental pedicle screw sedation and local autograft.  Surgeon:Levern Pitter A.Shareef Eddinger, M.D.  Asst. Surgeon: Newell Coral  Anesthesia: General  Indication: 37 year old male with severe back and bilateral lower extremity pain. She is and weakness consistent with neurogenic claudication failing conservative management. Workup demonstrates evidence of transitional anatomy at the lumbosacral junction. The 2 lowermost disc spaces which we have been enumerated as L3-4 and L4-5 demonstrate severe disc space collapse marked spondylitic protrusions and severe 6 central and foraminal stenosis. Plan for L3-4 and L4-5 decompression infusion in hopes of improving his symptoms.  Operative note: After induction anesthesia, patient positioned prone onto Wilson frame and appropriately padded. Lumbar region prepped and draped. Incision made. Dissection performed bilaterally exposing lamina facet joints and transverse processes of L3-L4 and L5. Deep self-retaining are displaced interoperative fluoroscopy used levels were confirmed. Decompressive laminectomies then performed using Leksell rongeurs Kerrison or his high-speed drill to remove the entire lamina of L3 entire lamina of L4 superior aspect of lamina of L5. Ligament flavum elevated and resected bilaterally. Inferior facetectomies performed bilaterally removing the whole inferior facet of  L3 and L4. Superior facetectomies performed bilaterally removing the superior facet of L4 and L5. Wide decompressive foraminotomies and performed on course exiting L3-L4 and L5 nerve roots. Bilateral discectomies and performed at L3-4 and L4-5. The spaces distraction up to 10 mm. A 10 mm distractors left in place on the left side thecal sac and nerve respect on the right side. The spaces and reamed and then cut with 10 mm tangent instruments. Soft tissue was removed and interspace. On the right side at L3-4-1 10 x 22 mm Telamon cage packed with morselized autograft and packed in place and recessed proximally 2 mm and posterior cortical margin. Distractors in patient's left side. Thecal sac or respect on the left side. The space was again reamed and then cut with 10 mm tangent is Mr. soft tissues removed and interspace. Morselize autograft and packed in the interspace. A 10 x 26 mm tangent wedges and packed in place and recessed roughly 1 mm from the posterior cortical margin. Procedure then repeated at L4-5 again without complication. Pedicles of L3-4 and 5 then identified using surface landmarks and intraoperative fluoroscopy. Superficial bone overlying the pedicles then removed using high-speed drill. Pedicles and probed using a pedicle awl. Each pedicle awl track was then tapped with a 5.25 mm screw tap awl. Each screw tap holes and probed and found to be solidly within bone. 6.75 x 45 mm radius screws placed bilaterally at L3 and L4. 6.75 x 40 mm screws placed bilaterally at L5. Transverse processes of L3-4 and 5 then decorticated using high-speed drill. Morselized autograft packed posterior laterally for later fusion. Short segment titanium rods and placed over the screw heads at L3-4 and 5. Locking caps and placed over the screw. Locking caps and engaged with the construct under compression. Final images revealed good position the bone graft and hardware proper upper level with normal S1. Transverse connector  was also placed. Gelfoam was placed topically. Medium Hemovac drain  was left at per space. Wounds and close in layers with Vicryl sutures. Steri-Strips triggers were applied. There were no apparent locations. Patient tolerated procedure well and returned to the recovery room postop.

## 2012-07-31 LAB — GLUCOSE, CAPILLARY: Glucose-Capillary: 163 mg/dL — ABNORMAL HIGH (ref 70–99)

## 2012-07-31 MED ORDER — MAGNESIUM HYDROXIDE 400 MG/5ML PO SUSP: 30.0000 mL | Freq: Every day | ORAL | Status: DC | PRN

## 2012-07-31 NOTE — Evaluation (Addendum)
Physical Therapy Evaluation Patient Details Name: [name] MRN: [id number] DOB: [birth year] Today's Date: 07/31/2012 Time: 1478-2956 PT Time Calculation (min): 35 min  PT Assessment / Plan / Recommendation Clinical Impression  Pt s/p L3-L5 fusion.  Needs skilled PT to maximize I and safety so pt can return home with wife. Pt has the most difficulty with transitional movements especially bed mobility.  Narrow hospital bed give pt little room to roll.  Pt has king size bed at home.  Expect pt will make steady progress. Reviewed back precautions with pt and gave handout.    PT Assessment  Patient needs continued PT services    Follow Up Recommendations  Home health PT    Does the patient have the potential to tolerate intense rehabilitation      Barriers to Discharge        Equipment Recommendations  None recommended by PT    Recommendations for Other Services     Frequency Min 5X/week    Precautions / Restrictions Precautions Precautions: Back Required Braces or Orthoses: Spinal Brace Spinal Brace: Lumbar corset;Applied in sitting position Restrictions Weight Bearing Restrictions: No   Pertinent Vitals/Pain Surgical pain in back with mobility.      Mobility  Bed Mobility Bed Mobility: Rolling Left;Left Sidelying to Sit Rolling Left: 4: Min assist;With rail Left Sidelying to Sit: 3: Mod assist;With rails;HOB elevated Details for Bed Mobility Assistance: Heavy assist to bring trunk up. Transfers Transfers: Sit to Stand;Stand to Sit Sit to Stand: 4: Min assist;With upper extremity assist;From elevated surface;From bed Stand to Sit: 4: Min guard;With upper extremity assist;With armrests;To chair/3-in-1 Details for Transfer Assistance: Incr effort to perform sit to stand Ambulation/Gait Ambulation/Gait Assistance: 5: Supervision Ambulation Distance (Feet): 400 Feet Assistive device: Rolling walker Ambulation/Gait Assistance Details: Pt took multiple standing rest  breaks to relieve pressure on arms and extend back Gait Pattern: Step-through pattern;Decreased stride length;Trunk flexed;Wide base of support Gait velocity: decr    Exercises     PT Diagnosis: Difficulty walking;Acute pain  PT Problem List: Decreased activity tolerance;Decreased mobility;Decreased knowledge of precautions;Pain;Decreased knowledge of use of DME PT Treatment Interventions: DME instruction;Gait training;Stair training;Functional mobility training;Patient/family education;Therapeutic activities   PT Goals Acute Rehab PT Goals PT Goal Formulation: With patient Time For Goal Achievement: 08/07/12 Potential to Achieve Goals: Good Pt will Roll Supine to Right Side: with supervision PT Goal: Rolling Supine to Right Side - Progress: Goal set today Pt will Roll Supine to Left Side: with supervision PT Goal: Rolling Supine to Left Side - Progress: Goal set today Pt will go Supine/Side to Sit: with supervision;with HOB 0 degrees PT Goal: Supine/Side to Sit - Progress: Goal set today Pt will go Sit to Supine/Side: with supervision PT Goal: Sit to Supine/Side - Progress: Goal set today Pt will go Sit to Stand: with modified independence PT Goal: Sit to Stand - Progress: Goal set today Pt will go Stand to Sit: with modified independence PT Goal: Stand to Sit - Progress: Goal set today Pt will Ambulate: >150 feet;with modified independence;with least restrictive assistive device Pt will Go Up / Down Stairs: 1-2 stairs;with supervision;with rail(s) PT Goal: Up/Down Stairs - Progress: Goal set today  Visit Information  Last PT Received On: 07/31/12 Assistance Needed: +1    Subjective Data  Subjective: Pt states that the bed mobility is the hardest for him. Patient Stated Goal: Return home   Prior Functioning  Home Living Lives With: Spouse Available Help at Discharge: Family;Available PRN/intermittently Type of Home:  House Home Access: Stairs to enter [employer] of Steps: 4 Entrance Stairs-Rails: Right Home Layout: One level Bathroom Shower/Tub: Engineer, manufacturing systems: Standard Home Adaptive Equipment: Raised toilet seat with rails;Walker - rolling;Tub transfer bench;Hand-held shower hose (Has borrowed from friend.) Prior Function Level of Independence: Independent Able to Take Stairs?: Yes Driving: Yes Vocation: Full time employment 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 [employer] Communication Communication: No difficulties    Copywriter, advertising Overall Cognitive Status: Appears within functional limits for tasks assessed/performed Arousal/Alertness: Awake/alert Orientation Level: Appears intact for tasks assessed Behavior During Session: [facility] for tasks performed    Extremity/Trunk Assessment Right Lower Extremity Assessment RLE ROM/Strength/Tone: [facility] for tasks assessed Left Lower Extremity Assessment LLE ROM/Strength/Tone: [facility] for tasks assessed   Balance Balance Balance Assessed: Yes Static Standing Balance Static Standing - Balance Support: No upper extremity supported;During functional activity Static Standing - Level of Assistance: 6: Modified independent (Device/Increase time)  End of Session PT - End of Session Equipment Utilized During Treatment: Back brace Activity Tolerance: Patient tolerated treatment well Patient left: in chair;with call bell/phone within reach Nurse Communication: Mobility status  GP     [name] 07/31/2012, 11:40 AM  [employer] PT [phone]

## 2012-07-31 NOTE — Evaluation (Signed)
Occupational Therapy Evaluation Patient Details Name: [name] MRN: [id number] DOB: 18-Dec-1975 Today's Date: 07/31/2012 Time: 4098-1191 OT Time Calculation (min): 14 min  OT Assessment / Plan / Recommendation Clinical Impression  This 37 yo male s/p back fusion surgery presents to acute OT with problems below. Will benefit from acute OT without need for follow up.    OT Assessment  Patient needs continued OT Services    Follow Up Recommendations  No OT follow up    Barriers to Discharge None    Equipment Recommendations  3 in 1 bedside comode       Frequency  Min 2X/week    Precautions / Restrictions Precautions Precautions: Back Required Braces or Orthoses: Spinal Brace Spinal Brace: Lumbar corset;Applied in sitting position Restrictions Weight Bearing Restrictions: No   Pertinent Vitals/Pain 7/10 back    ADL  Eating/Feeding: Simulated;Independent Where Assessed - Eating/Feeding: Chair Grooming: Simulated;Set up Where Assessed - Grooming: Unsupported sitting Upper Body Bathing: Simulated;Set up;Supervision/safety Where Assessed - Upper Body Bathing: Unsupported sitting Lower Body Bathing: Simulated;Maximal assistance Where Assessed - Lower Body Bathing: Supported sit to stand Upper Body Dressing: Simulated;Minimal assistance Where Assessed - Upper Body Dressing: Unsupported sitting Lower Body Dressing: Simulated;+1 Total assistance Where Assessed - Lower Body Dressing: Supported sit to stand ADL Comments: Already has a Sports administrator at home. Pt could recall only 1 of 3 back precautions (no bending)    OT Diagnosis: Generalized weakness;Acute pain  OT Problem List: Decreased strength;Pain;Decreased knowledge of use of DME or AE;Impaired balance (sitting and/or standing);Obesity OT Treatment Interventions: Self-care/ADL training;DME and/or AE instruction;Patient/family education;Balance training   OT Goals Acute Rehab OT Goals OT Goal Formulation: With  patient Time For Goal Achievement: 08/07/12 Potential to Achieve Goals: Good ADL Goals Pt Will Perform Lower Body Bathing: with set-up;with supervision;Sit to stand from chair;Sit to stand from bed;Unsupported;with adaptive equipment ADL Goal: Lower Body Bathing - Progress: Goal set today Pt Will Perform Lower Body Dressing: with set-up;with supervision;Sit to stand from chair;Sit to stand from bed;Unsupported;with adaptive equipment ADL Goal: Lower Body Dressing - Progress: Goal set today Pt Will Transfer to Toilet: with supervision;Ambulation;with DME;3-in-1;Maintaining back safety precautions ADL Goal: Toilet Transfer - Progress: Goal set today Pt Will Perform Toileting - Clothing Manipulation: with supervision;Standing ADL Goal: Toileting - Clothing Manipulation - Progress: Goal set today Pt Will Perform Toileting - Hygiene: with supervision;with adaptive equipment;Sit to stand from 3-in-1/toilet ADL Goal: Toileting - Hygiene - Progress: Goal set today Miscellaneous OT Goals Miscellaneous OT Goal #1: Pt will be S in and OOB for BADLs OT Goal: Miscellaneous Goal #1 - Progress: Goal set today Miscellaneous OT Goal #2: Pt will be able to don brace independently OT Goal: Miscellaneous Goal #2 - Progress: Goal set today Miscellaneous OT Goal #3: Pt will be able to state and follow back precautions OT Goal: Miscellaneous Goal #3 - Progress: Goal set today  Visit Information  Last OT Received On: 07/31/12 Assistance Needed: +1       Prior Functioning     Home Living Lives With: Spouse Available Help at Discharge: Family;Available PRN/intermittently Type of Home: House Home Access: Stairs to enter [employer] of Steps: 4 Entrance Stairs-Rails: Right Home Layout: One level Bathroom Shower/Tub: Tub/shower unit;Curtain Bathroom Toilet: Standard Home Adaptive Equipment: Raised toilet seat with rails;Walker - rolling;Tub transfer bench;Hand-held shower hose Prior  Function Level of Independence: Independent Able to Take Stairs?: Yes Driving: Yes Vocation: Full time employment Comments: Works on [employer] for the city of [place]  Communication: No difficulties Dominant Hand: Right         Vision/Perception Vision - History Baseline Vision: No visual deficits   Cognition  Cognition Overall Cognitive Status: Appears within functional limits for tasks assessed/performed Arousal/Alertness: Lethargic Orientation Level: Appears intact for tasks assessed Behavior During Session: Lethargic    Extremity/Trunk Assessment Right Upper Extremity Assessment RUE ROM/Strength/Tone: Within functional levels Left Upper Extremity Assessment LUE ROM/Strength/Tone: Within functional levels              End of Session OT - End of Session Activity Tolerance: Patient limited by fatigue Patient left: in bed;with call bell/phone within reach Nurse Communication:  (Need to 3n1 to try---I ended up going to get one on 5N)       [name] [phone] 07/31/2012, 4:38 PM

## 2012-07-31 NOTE — Progress Notes (Signed)
Postop day 1. Overall doing well. Back pain controlled. No lower extremity pain. States his legs feel much better.  Afebrile. Vital stable. Drain output moderate. Motor and sensory exam intact. Wound clean and dry. Chest and abdomen benign.  Progressing well following decompression and fusion. Continue efforts at mobilization. Possible discharge tomorrow.

## 2012-08-01 LAB — GLUCOSE, CAPILLARY
Glucose-Capillary: 106 mg/dL — ABNORMAL HIGH (ref 70–99)
Glucose-Capillary: 101 mg/dL — ABNORMAL HIGH (ref 70–99)
Glucose-Capillary: 107 mg/dL — ABNORMAL HIGH (ref 70–99)
Glucose-Capillary: 123 mg/dL — ABNORMAL HIGH (ref 70–99)

## 2012-08-01 NOTE — Progress Notes (Signed)
I agree with the information in the note.  Ignacia Palma, [place] [phone] 08/01/2012

## 2012-08-01 NOTE — Progress Notes (Signed)
Physical Therapy Treatment Patient Details Name: [name] MRN: [id number] DOB: [birth year] Today's Date: 08/01/2012 Time: 4098-1191 PT Time Calculation (min): 23 min  PT Assessment / Plan / Recommendation Comments on Treatment Session  Pt s/p L3-5 fusion.  Pt making steady progress.  Bed mobility still the most difficult.      Follow Up Recommendations  Home health PT     Does the patient have the potential to tolerate intense rehabilitation     Barriers to Discharge        Equipment Recommendations  None recommended by PT    Recommendations for Other Services    Frequency Min 5X/week   Plan Discharge plan remains appropriate;Frequency remains appropriate    Precautions / Restrictions Precautions Precautions: Back Required Braces or Orthoses: Spinal Brace Spinal Brace: Lumbar corset;Applied in sitting position Restrictions Weight Bearing Restrictions: No   Pertinent Vitals/Pain See flow sheet.    Mobility  Bed Mobility Bed Mobility: Sit to Sidelying Left Sit to Sidelying Left: 5: Supervision;HOB flat Details for Bed Mobility Assistance: Incr time and effort. Verbal cues for technique. Transfers Sit to Stand: 5: Supervision;With upper extremity assist;With armrests;From chair/3-in-1 Stand to Sit: 5: Supervision;With upper extremity assist;To bed Details for Transfer Assistance: Incr time Ambulation/Gait Ambulation/Gait Assistance: 5: Supervision Ambulation Distance (Feet): 100 Feet Assistive device: Rolling walker Ambulation/Gait Assistance Details: Rest break to relieve pressure on arms and extend back. Gait Pattern: Step-through pattern;Decreased stride length;Trunk flexed;Wide base of support Gait velocity: decr Stairs: Yes Stairs Assistance: 5: Supervision Stairs Assistance Details (indicate cue type and reason): verbal cues for technique Stair Management Technique: One rail Right Number of Stairs: 2    Exercises     PT Diagnosis:    PT Problem List:    PT Treatment Interventions:     PT Goals Acute Rehab PT Goals PT Goal: Sit to Supine/Side - Progress: Met PT Goal: Sit to Stand - Progress: Progressing toward goal PT Goal: Stand to Sit - Progress: Progressing toward goal PT Goal: Ambulate - Progress: Progressing toward goal PT Goal: Up/Down Stairs - Progress: Met  Visit Information  Last PT Received On: 08/01/12 Assistance Needed: +1    Subjective Data  Subjective: Pt states he had just gotten back from walking.   Cognition  Cognition Overall Cognitive Status: Appears within functional limits for tasks assessed/performed Arousal/Alertness: Awake/alert Orientation Level: Appears intact for tasks assessed Behavior During Session: [facility] for tasks performed    Balance  Static Standing Balance Static Standing - Balance Support: No upper extremity supported;During functional activity Static Standing - Level of Assistance: 6: Modified independent (Device/Increase time)  End of Session PT - End of Session Equipment Utilized During Treatment: Back brace Activity Tolerance: Patient tolerated treatment well Patient left: in bed;with call bell/phone within reach Nurse Communication: Mobility status   GP     [facility] 08/01/2012, 10:23 AM  Skip Mayer PT [phone]

## 2012-08-01 NOTE — Progress Notes (Signed)
Complains of generalized soreness particularly around the incision. No radicular pain. No numbness or weakness.  Afebrile. Vital stable. Urine output good. Drain output lessening. Drain has become dislodged this morning and I removed it. Awake and alert. Oriented and appropriate. Motor and sensory function intact. Wound dressing clean and dry. Chest and abdomen benign.  Progressing fairly well following 2 level decompression and fusion. I don't. Home today but his overall mobility precludes it at this point. Probable discharge tomorrow.

## 2012-08-01 NOTE — Progress Notes (Signed)
Occupational Therapy Treatment Patient Details Name: [name] MRN: [id number] DOB: 1976/04/21 Today's Date: 08/01/2012 Time: 0217-0257 OT Time Calculation (min): 40 min  OT Assessment / Plan / Recommendation Comments on Treatment Session This 37 yo male s/p back surgery presents to acute OT this session making progress. Pt needs to demonstrate knowledge/safety of back precautions, AE, and DME    Follow Up Recommendations  No OT follow up    Barriers to Discharge       Equipment Recommendations  None recommended by OT    Recommendations for Other Services    Frequency Min 2X/week   Plan Discharge plan remains appropriate    Precautions / Restrictions Precautions Precautions: Back Required Braces or Orthoses: Spinal Brace Spinal Brace: Lumbar corset;Applied in sitting position Restrictions Weight Bearing Restrictions: No   Pertinent Vitals/Pain 8/10    ADL  Grooming: Performed;Wash/dry hands;Teeth care;Supervision/safety Where Assessed - Grooming: Unsupported standing Lower Body Dressing: Performed;Set up;Supervision/safety Where Assessed - Lower Body Dressing: Unsupported sitting Toilet Transfer: Performed;Supervision/safety Toilet Transfer Method: Sit to stand Toilet Transfer Equipment: Raised toilet seat with arms (or 3-in-1 over toilet) Toileting - Clothing Manipulation and Hygiene: Performed;Supervision/safety Where Assessed - Toileting Clothing Manipulation and Hygiene: Sit to stand from 3-in-1 or toilet Equipment Used: Back brace;Gait belt;Rolling walker;Reacher;Sock aid Transfers/Ambulation Related to ADLs: Was able to complete transfers with [employer].  Had increasing pain when transferring from sit to stand and stand to sit ADL Comments: Pt could recall all back precautions.  Wants to obtain AE equipment (hip kit).  Will show toliet aid next tx session    OT Diagnosis:    OT Problem List:   OT Treatment Interventions:     OT Goals Acute Rehab OT  Goals OT Goal Formulation: With patient Time For Goal Achievement: 08/07/12 Potential to Achieve Goals: Good ADL Goals Pt Will Perform Lower Body Dressing: with set-up;with supervision;Sit to stand from chair;Sit to stand from bed;Unsupported;with adaptive equipment ADL Goal: Lower Body Dressing - Progress: Progressing toward goals Pt Will Transfer to Toilet: with supervision;Ambulation;with DME;3-in-1;Maintaining back safety precautions ADL Goal: Toilet Transfer - Progress: Progressing toward goals Pt Will Perform Toileting - Clothing Manipulation: with supervision;Standing ADL Goal: Toileting - Clothing Manipulation - Progress: Progressing toward goals Pt Will Perform Toileting - Hygiene: with supervision;with adaptive equipment;Sit to stand from 3-in-1/toilet ADL Goal: Toileting - Hygiene - Progress: Progressing toward goals Miscellaneous OT Goals OT Goal: Miscellaneous Goal #1 - Progress: Progressing toward goals OT Goal: Miscellaneous Goal #2 - Progress: Met OT Goal: Miscellaneous Goal #3 - Progress: Progressing toward goals  Visit Information  Last OT Received On: 08/01/12 Assistance Needed: +1    Subjective Data  Subjective: "I should have got up yesterday" in regards to pain Patient Stated Goal: When I get home I do not want to be in so much pain   Prior Functioning       Cognition  Cognition Overall Cognitive Status: Appears within functional limits for tasks assessed/performed Arousal/Alertness: Awake/alert Orientation Level: Appears intact for tasks assessed Behavior During Session: [facility] for tasks performed    Mobility  Bed Mobility Bed Mobility: Rolling Left;Left Sidelying to Sit;Sitting - Scoot to Edge of Bed Rolling Left: 5: Supervision;With rail Left Sidelying to Sit: 4: Min assist;With rails;HOB flat (20 degrees) Sitting - Scoot to Edge of Bed: 5: Supervision Details for Bed Mobility Assistance: Attempted to have pt sit up without using rail, however he  requested that it be put up where he could use it Transfers Transfers: Sit to  Stand;Stand to Sit Sit to Stand: 4: Min guard;From elevated surface;From bed;With upper extremity assist Stand to Sit: 5: Supervision;To chair/3-in-1;With armrests;With upper extremity assist Details for Transfer Assistance: Pt needed to move more slowly, Verbal cues for hand placement    Exercises      Balance Balance Balance Assessed: Yes Static Standing Balance Static Standing - Balance Support: No upper extremity supported;During functional activity Static Standing - Level of Assistance:  (Supervison)   End of Session OT - End of Session Equipment Utilized During Treatment: Gait belt;Back brace Activity Tolerance: Patient tolerated treatment well Patient left: in chair;with call bell/phone within reach Nurse Communication:  (Pt wanted to bath and wanted pain meds)  GO     Mayford Knife, [place] 08/01/2012, 4:14 PM

## 2012-08-02 LAB — GLUCOSE, CAPILLARY
Glucose-Capillary: 111 mg/dL — ABNORMAL HIGH (ref 70–99)
Glucose-Capillary: 115 mg/dL — ABNORMAL HIGH (ref 70–99)

## 2012-08-02 MED ORDER — OXYCODONE-ACETAMINOPHEN 10-325 MG PO TABS: 1.0000 | ORAL_TABLET | ORAL | Status: DC | PRN

## 2012-08-02 MED ORDER — DIAZEPAM 5 MG PO TABS: 5.0000 mg | ORAL_TABLET | Freq: Four times a day (QID) | ORAL | Status: DC | PRN

## 2012-08-02 NOTE — Progress Notes (Signed)
Physical Therapy Treatment Patient Details Name: KYLAN LIBERATI MRN: [id number] DOB: Oct 06, 1975 Today's Date: 08/02/2012 Time: 1031-     PT Assessment / Plan / Recommendation Comments on Treatment Session  Pt s/p L3-5 fusion.  Pt making steady progress.  Ready for DC home.    Follow Up Recommendations  Other (comment) (MD didn't want HHPT)     Does the patient have the potential to tolerate intense rehabilitation     Barriers to Discharge        Equipment Recommendations  None recommended by PT    Recommendations for Other Services    Frequency Min 5X/week   Plan Discharge plan remains appropriate;Frequency remains appropriate    Precautions / Restrictions Precautions Precautions: Back Required Braces or Orthoses: Spinal Brace Spinal Brace: Lumbar corset;Applied in sitting position Restrictions Weight Bearing Restrictions: No   Pertinent Vitals/Pain Buttock pain.    Mobility  Bed Mobility Details for Bed Mobility Assistance: Was unable to participate in any movements/activities because of pain Transfers Sit to Stand: 6: Modified independent (Device/Increase time);With upper extremity assist;From bed;From chair/3-in-1 Stand to Sit: 6: Modified independent (Device/Increase time);With upper extremity assist;With armrests;To chair/3-in-1 Details for Transfer Assistance: Incr time Ambulation/Gait Ambulation/Gait Assistance: 6: Modified independent (Device/Increase time) Ambulation Distance (Feet): 200 Feet Assistive device: Rolling walker Ambulation/Gait Assistance Details: Multiple rest breaks to relieve pressure on arms. Gait Pattern: Step-through pattern;Decreased stride length;Trunk flexed;Wide base of support Gait velocity: decr    Exercises     PT Diagnosis:    PT Problem List:   PT Treatment Interventions:     PT Goals Acute Rehab PT Goals PT Goal: Sit to Stand - Progress: Met PT Goal: Stand to Sit - Progress: Met PT Goal: Ambulate - Progress: Met  Visit  Information  Last PT Received On: 08/02/12 Assistance Needed: +1    Subjective Data  Subjective: Pt c/o buttock pain.   Cognition  Cognition Overall Cognitive Status: Appears within functional limits for tasks assessed/performed Arousal/Alertness: Awake/alert (In pain on the lower back; limited activity) Orientation Level: Appears intact for tasks assessed Behavior During Session: [facility] for tasks performed    Balance  Balance Balance Assessed: No Static Standing Balance Static Standing - Balance Support: No upper extremity supported;During functional activity Static Standing - Level of Assistance: 6: Modified independent (Device/Increase time)  End of Session PT - End of Session Equipment Utilized During Treatment: Back brace Activity Tolerance: Patient tolerated treatment well Patient left: in bed;with call bell/phone within reach Nurse Communication: Mobility status   GP     [facility] 08/02/2012, 12:37 PM  [facility] PT [phone]

## 2012-08-02 NOTE — Progress Notes (Signed)
UR COMPLETED  

## 2012-08-02 NOTE — Progress Notes (Signed)
I have reviewed and agree with the students treatment session. Ignacia Palma, [place] [phone] 08/02/2012

## 2012-08-02 NOTE — Progress Notes (Signed)
Occupational Therapy Treatment Patient Details Name: [name] MRN: [id number] DOB: 1975-09-17 Today's Date: 08/02/2012 Time: 4098-1191 OT Time Calculation (min): 13 min  OT Assessment / Plan / Recommendation Comments on Treatment Session This 37 yo male s/p back surgery presents to acute OT this sessionvery limited by pain, unable to move in bed.  Showed how toliet aid works and if needed where to obtain one from    Follow Up Recommendations  No OT follow up       Equipment Recommendations  None recommended by OT       Frequency Min 2X/week   Plan Discharge plan remains appropriate    Precautions / Restrictions Precautions Precautions: Back Required Braces or Orthoses: Spinal Brace Spinal Brace: Lumbar corset;Applied in sitting position Restrictions Weight Bearing Restrictions: No   Pertinent Vitals/Pain 8/10    ADL  Equipment Used: Other (comment) (Toliet aid) Transfers/Ambulation Related to ADLs: Pt stated he just laid down and it would be to painful to get back up from bed    OT Goals    Visit Information  Last OT Received On: 08/02/12 Assistance Needed: +1    Subjective Data  Subjective: I'm in so much pain I have throbbing in my lower back   Prior Functioning       Cognition  Cognition Overall Cognitive Status: Appears within functional limits for tasks assessed/performed Arousal/Alertness: Awake/alert (In pain on the lower back; limited activity) Orientation Level: Appears intact for tasks assessed Behavior During Session: [facility] for tasks performed    Mobility  Bed Mobility Details for Bed Mobility Assistance: Was unable to participate in any movements/activities because of pain       Balance Balance Balance Assessed: No   End of Session OT - End of Session Activity Tolerance: Patient limited by pain Patient left: in bed;with call bell/phone within reach Nurse Communication:  [name] with nurse about meds for pt/ Gave pager for AE)        [name] 08/02/2012, 12:17 PM

## 2012-08-02 NOTE — Progress Notes (Signed)
Pt. discharged home accompanied by father. Prescriptions and discharge instructions given with verbalization of understanding. Incision site on back with no s/s of infection - no swelling, redness, bleeding, and/or drainage noted.  Lumbar surgery notes instructions given to patient and family member for home safety and precautions.  Opportunity given to ask questions but no question asked. Pt. transported out of this unit in wheelchair by the nurse tech.

## 2012-08-02 NOTE — Discharge Summary (Signed)
Physician Discharge Summary  Patient ID: [name] MRN: [id number] DOB/AGE: 06-Feb-1976 37 y.o.  Admit date: 07/30/2012 Discharge date: 08/02/2012  Admission Diagnoses:  Discharge Diagnoses:  Principal Problem:   Spinal stenosis, lumbar region, with neurogenic claudication   Discharged Condition: good  Hospital Course: Patient in the hospital where he underwent uncomplicated 2 level lumbar decompression and fusion with instrumentation. Postoperatively he is done well. Preoperative lower extremity pain resolved. Strength cessation much improved. Progressing well and ready for discharge home.  Consults:   Significant Diagnostic Studies:   Treatments:   Discharge Exam: Blood pressure 97/61, pulse 101, temperature 99.4 F (37.4 C), temperature source Oral, resp. rate 18, SpO2 93.00%. Awake and alert. Oriented and appropriate. Cranial nerve function intact. Motor and sensory function intact. Wound clean and dry. Chest and abdomen benign.  Disposition: Final discharge disposition not confirmed     Medication List    TAKE these medications       Aspirin-Caffeine 1000-65 MG Pack  Take 2 tablets by mouth daily.     diazepam 5 MG tablet  Commonly known as:  VALIUM  Take 1-2 tablets (5-10 mg total) by mouth every 6 (six) hours as needed.     diclofenac 75 MG EC tablet  Commonly known as:  VOLTAREN  Take 75 mg by mouth 2 (two) times daily.     DULoxetine 60 MG capsule  Commonly known as:  CYMBALTA  Take 60 mg by mouth daily.     Fish Oil 1000 MG Caps  Take 3,000 mg by mouth daily.     metFORMIN 1000 MG tablet  Commonly known as:  GLUCOPHAGE  Take 1,000 mg by mouth daily with breakfast.     oxyCODONE-acetaminophen 10-325 MG per tablet  Commonly known as:  PERCOCET  Take 1-2 tablets by mouth every 4 (four) hours as needed for pain.     traMADol 50 MG tablet  Commonly known as:  ULTRAM  Take 50-100 mg by mouth every 6 (six) hours as needed for pain.     triamterene-hydrochlorothiazide 75-50 MG per tablet  Commonly known as:  MAXZIDE  Take 1 tablet by mouth daily.           Follow-up Information   Follow up with Dickie Cloe A, MD. Call in 1 week. (Ask for Lurena Joiner)    Contact information:   [address] [place] [address] [phone]       Signed: Julio Sicks A 08/02/2012, 9:26 AM

## 2013-05-15 DIAGNOSIS — M79609 Pain in unspecified limb: Secondary | ICD-10-CM

## 2013-10-03 ENCOUNTER — Other Ambulatory Visit: Payer: Self-pay | Admitting: Neurosurgery

## 2013-10-03 DIAGNOSIS — M549 Dorsalgia, unspecified: Secondary | ICD-10-CM

## 2013-10-11 ENCOUNTER — Ambulatory Visit
Admission: RE | Admit: 2013-10-11 | Discharge: 2013-10-11 | Disposition: A | Discharge status: Home / Self Care | Payer: Worker's Compensation | Source: Ambulatory Visit | Attending: Neurosurgery | Admitting: Neurosurgery

## 2013-10-11 VITALS — BP 123/67 | HR 75

## 2013-10-11 DIAGNOSIS — M48062 Spinal stenosis, lumbar region with neurogenic claudication: Secondary | ICD-10-CM

## 2013-10-11 DIAGNOSIS — M549 Dorsalgia, unspecified: Secondary | ICD-10-CM

## 2013-10-11 MED ORDER — ONDANSETRON HCL 4 MG/2ML IJ SOLN
4.0000 mg | Freq: Once | INTRAMUSCULAR | Status: AC
  Administered 2013-10-11: 4 mg via INTRAMUSCULAR

## 2013-10-11 MED ORDER — IOHEXOL 180 MG/ML  SOLN
17.0000 mL | Freq: Once | INTRAMUSCULAR | Status: AC | PRN
Start: 2013-10-11 — End: 2013-10-11
  Administered 2013-10-11: 17 mL via INTRATHECAL

## 2013-10-11 MED ORDER — MEPERIDINE HCL 100 MG/ML IJ SOLN
100.0000 mg | Freq: Once | INTRAMUSCULAR | Status: AC
Start: 2013-10-11 — End: 2013-10-11
  Administered 2013-10-11: 100 mg via INTRAMUSCULAR

## 2013-10-11 MED ORDER — DIAZEPAM 5 MG PO TABS
10.0000 mg | ORAL_TABLET | Freq: Once | ORAL | Status: AC
  Administered 2013-10-11: 10 mg via ORAL

## 2013-10-11 NOTE — Discharge Instructions (Signed)

## 2014-01-03 ENCOUNTER — Other Ambulatory Visit: Payer: Self-pay | Admitting: Neurosurgery

## 2014-01-08 ENCOUNTER — Encounter (HOSPITAL_COMMUNITY): Payer: Self-pay | Admitting: Pharmacy Technician

## 2014-01-09 ENCOUNTER — Encounter (HOSPITAL_COMMUNITY)
Admission: RE | Admit: 2014-01-09 | Discharge: 2014-01-09 | Disposition: A | Discharge status: Home / Self Care | Payer: Worker's Compensation | Source: Ambulatory Visit | Attending: Neurosurgery | Admitting: Neurosurgery

## 2014-01-09 ENCOUNTER — Other Ambulatory Visit (HOSPITAL_COMMUNITY): Payer: Self-pay

## 2014-01-09 ENCOUNTER — Encounter (HOSPITAL_COMMUNITY): Payer: Self-pay

## 2014-01-09 ENCOUNTER — Ambulatory Visit (HOSPITAL_COMMUNITY)
Admission: RE | Admit: 2014-01-09 | Discharge: 2014-01-09 | Disposition: A | Discharge status: Home / Self Care | Payer: Worker's Compensation | Source: Ambulatory Visit | Attending: Neurosurgery | Admitting: Neurosurgery

## 2014-01-09 DIAGNOSIS — M48062 Spinal stenosis, lumbar region with neurogenic claudication: Secondary | ICD-10-CM | POA: Insufficient documentation

## 2014-01-09 DIAGNOSIS — Z01818 Encounter for other preprocedural examination: Secondary | ICD-10-CM | POA: Insufficient documentation

## 2014-01-09 DIAGNOSIS — I10 Essential (primary) hypertension: Secondary | ICD-10-CM | POA: Insufficient documentation

## 2014-01-09 LAB — CBC
HCT: 38.5 % — ABNORMAL LOW (ref 39.0–52.0)
Hemoglobin: 12.7 g/dL — ABNORMAL LOW (ref 13.0–17.0)
MCH: 30.7 pg (ref 26.0–34.0)
MCHC: 33 g/dL (ref 30.0–36.0)
MCV: 93 fL (ref 78.0–100.0)
PLATELETS: 316 10*3/uL (ref 150–400)
RBC: 4.14 MIL/uL — ABNORMAL LOW (ref 4.22–5.81)
RDW: 13.5 % (ref 11.5–15.5)
WBC: 7.7 10*3/uL (ref 4.0–10.5)

## 2014-01-09 LAB — BASIC METABOLIC PANEL
ANION GAP: 15 (ref 5–15)
BUN: 18 mg/dL (ref 6–23)
CALCIUM: 8.6 mg/dL (ref 8.4–10.5)
CO2: 24 mEq/L (ref 19–32)
Chloride: 102 mEq/L (ref 96–112)
Creatinine, Ser: 0.98 mg/dL (ref 0.50–1.35)
GFR calc non Af Amer: >90 mL/min (ref >90)
Glucose, Bld: 96 mg/dL (ref 70–99)
Potassium: 4.7 mEq/L (ref 3.7–5.3)
SODIUM: 141 meq/L (ref 137–147)

## 2014-01-09 LAB — TYPE AND SCREEN
ABO/RH(D): O POS
ANTIBODY SCREEN: NEGATIVE

## 2014-01-09 LAB — SURGICAL PCR SCREEN
MRSA, PCR: NEGATIVE
STAPHYLOCOCCUS AUREUS: NEGATIVE

## 2014-01-09 NOTE — Pre-Procedure Instructions (Signed)
[name]  01/09/2014   Your procedure is scheduled on:  01/14/14  Report to [facility] at 530  AM.  Call this number if you have problems the morning of surgery: [phone]   Remember:   Do not eat food or drink liquids after midnight.   Take these medicines the morning of surgery with A SIP OF WATER: valium,cymbalta, Oxycodone,  Do not wear jewelry, make-up or nail polish.  Do not wear lotions, powders, or perfumes. You may wear deodorant.  Do not shave 48 hours prior to surgery. Men may shave face and neck.  Do not bring valuables to the hospital.  [facility] is not responsible                  for any belongings or valuables.               Contacts, dentures or bridgework may not be worn into surgery.  Leave suitcase in the car. After surgery it may be brought to your room.  For patients admitted to the hospital, discharge time is determined by your                treatment team.               Patients discharged the day of surgery will not be allowed to drive  home.  Name and phone number of your driver:   Special Instructions: Shower using CHG 2 nights before surgery and the night before surgery.  If you shower the day of surgery use CHG.  Use special wash - you have one bottle of CHG for all showers.  You should use approximately 1/3 of the bottle for each shower.   Please read over the following fact sheets that you were given: Pain Booklet, Coughing and Deep Breathing, Blood Transfusion Information, MRSA Information and Surgical Site Infection Prevention

## 2014-01-13 MED ORDER — CEFAZOLIN SODIUM 10 G IJ SOLR
3.0000 g | INTRAMUSCULAR | Status: AC
  Administered 2014-01-14: 3 g via INTRAVENOUS
  Filled 2014-01-13: qty 3000

## 2014-01-13 MED ORDER — DEXAMETHASONE SODIUM PHOSPHATE 10 MG/ML IJ SOLN
10.0000 mg | INTRAMUSCULAR | Status: DC
  Filled 2014-01-13: qty 1

## 2014-01-13 NOTE — Anesthesia Preprocedure Evaluation (Addendum)
Anesthesia Evaluation  Patient identified by MRN, date of birth, ID band Patient awake    Reviewed: Allergy & Precautions, H&P , NPO status , Patient's Chart, lab work & pertinent test results  Airway Mallampati: III TM Distance: >3 FB Neck ROM: Full    Dental  (+) Dental Advisory Given, Teeth Intact   Pulmonary Sleep apnea: probable based on BMI. , former smoker (quit 3/14 10 Pack year),  breath sounds clear to auscultation        Cardiovascular hypertension, Pt. on medications Rhythm:Regular Rate:Normal     Neuro/Psych    GI/Hepatic   Endo/Other  diabetes, Well Controlled, Type 2, Oral Hypoglycemic Agents  Renal/GU      Musculoskeletal   Abdominal (+)  Abdomen: soft.    Peds  Hematology   Anesthesia Other Findings Left LE pain w/ numbness through left knee  Reproductive/Obstetrics                       Anesthesia Physical Anesthesia Plan  ASA: III  Anesthesia Plan: General   Post-op Pain Management:    Induction: Intravenous  Airway Management Planned: Oral ETT  Additional Equipment:   Intra-op Plan:   Post-operative Plan: Extubation in OR  Informed Consent: I have reviewed the patients History and Physical, chart, labs and discussed the procedure including the risks, benefits and alternatives for the proposed anesthesia with the patient or authorized representative who has indicated his/her understanding and acceptance.     Plan Discussed with:   Anesthesia Plan Comments: (Last GA Grade III view MAC 4 7.5 tube)        Anesthesia Quick Evaluation

## 2014-01-14 ENCOUNTER — Encounter (HOSPITAL_COMMUNITY)
Admission: RE | Disposition: A | Discharge status: Home / Self Care | Payer: BC Managed Care – PPO | Source: Ambulatory Visit | Attending: Neurosurgery

## 2014-01-14 ENCOUNTER — Inpatient Hospital Stay (HOSPITAL_COMMUNITY)
Admission: RE | Admit: 2014-01-14 | Discharge: 2014-01-18 | DRG: 460 | Disposition: A | Discharge status: Home / Self Care | Payer: Worker's Compensation | Source: Ambulatory Visit | Attending: Neurosurgery | Admitting: Neurosurgery

## 2014-01-14 ENCOUNTER — Inpatient Hospital Stay (HOSPITAL_COMMUNITY): Payer: Worker's Compensation

## 2014-01-14 ENCOUNTER — Encounter (HOSPITAL_COMMUNITY): Payer: Self-pay | Admitting: Anesthesiology

## 2014-01-14 ENCOUNTER — Encounter (HOSPITAL_COMMUNITY): Payer: Worker's Compensation | Admitting: Anesthesiology

## 2014-01-14 ENCOUNTER — Inpatient Hospital Stay (HOSPITAL_COMMUNITY): Payer: Worker's Compensation | Admitting: Anesthesiology

## 2014-01-14 DIAGNOSIS — E119 Type 2 diabetes mellitus without complications: Secondary | ICD-10-CM | POA: Diagnosis present

## 2014-01-14 DIAGNOSIS — M129 Arthropathy, unspecified: Secondary | ICD-10-CM | POA: Diagnosis present

## 2014-01-14 DIAGNOSIS — Z981 Arthrodesis status: Secondary | ICD-10-CM

## 2014-01-14 DIAGNOSIS — M48062 Spinal stenosis, lumbar region with neurogenic claudication: Secondary | ICD-10-CM | POA: Diagnosis present

## 2014-01-14 DIAGNOSIS — D649 Anemia, unspecified: Secondary | ICD-10-CM | POA: Diagnosis present

## 2014-01-14 DIAGNOSIS — I10 Essential (primary) hypertension: Secondary | ICD-10-CM | POA: Diagnosis present

## 2014-01-14 DIAGNOSIS — Z7982 Long term (current) use of aspirin: Secondary | ICD-10-CM

## 2014-01-14 DIAGNOSIS — Z87891 Personal history of nicotine dependence: Secondary | ICD-10-CM

## 2014-01-14 LAB — CBC WITH DIFFERENTIAL/PLATELET
BASOS ABS: 0.1 10*3/uL (ref 0.0–0.1)
Basophils Relative: 1 % (ref 0–1)
Eosinophils Absolute: 0.4 10*3/uL (ref 0.0–0.7)
Eosinophils Relative: 5 % (ref 0–5)
HCT: 37.9 % — ABNORMAL LOW (ref 39.0–52.0)
HEMOGLOBIN: 12.7 g/dL — AB (ref 13.0–17.0)
LYMPHS PCT: 38 % (ref 12–46)
Lymphs Abs: 3.3 10*3/uL (ref 0.7–4.0)
MCH: 30.8 pg (ref 26.0–34.0)
MCHC: 33.5 g/dL (ref 30.0–36.0)
MCV: 91.8 fL (ref 78.0–100.0)
Monocytes Absolute: 0.7 10*3/uL (ref 0.1–1.0)
Monocytes Relative: 8 % (ref 3–12)
NEUTROS PCT: 48 % (ref 43–77)
Neutro Abs: 4.2 10*3/uL (ref 1.7–7.7)
PLATELETS: 348 10*3/uL (ref 150–400)
RBC: 4.13 MIL/uL — ABNORMAL LOW (ref 4.22–5.81)
RDW: 13.4 % (ref 11.5–15.5)
WBC: 8.7 10*3/uL (ref 4.0–10.5)

## 2014-01-14 LAB — GLUCOSE, CAPILLARY
GLUCOSE-CAPILLARY: 128 mg/dL — AB (ref 70–99)
GLUCOSE-CAPILLARY: 169 mg/dL — AB (ref 70–99)
Glucose-Capillary: 104 mg/dL — ABNORMAL HIGH (ref 70–99)
Glucose-Capillary: 138 mg/dL — ABNORMAL HIGH (ref 70–99)
Glucose-Capillary: 149 mg/dL — ABNORMAL HIGH (ref 70–99)
Glucose-Capillary: 186 mg/dL — ABNORMAL HIGH (ref 70–99)

## 2014-01-14 SURGERY — POSTERIOR LUMBAR FUSION 1 LEVEL
Anesthesia: General | Site: Back

## 2014-01-14 MED ORDER — LACTATED RINGERS IV SOLN
INTRAVENOUS | Status: DC | PRN
  Administered 2014-01-14 (×2): via INTRAVENOUS

## 2014-01-14 MED ORDER — PROPOFOL 10 MG/ML IV BOLUS
INTRAVENOUS | Status: AC
  Filled 2014-01-14: qty 20

## 2014-01-14 MED ORDER — PROMETHAZINE HCL 25 MG/ML IJ SOLN: 6.2500 mg | INTRAMUSCULAR | Status: DC | PRN

## 2014-01-14 MED ORDER — FUROSEMIDE 10 MG/ML IJ SOLN
INTRAMUSCULAR | Status: DC | PRN
  Administered 2014-01-14: 5 mg via INTRAMUSCULAR

## 2014-01-14 MED ORDER — SODIUM CHLORIDE 0.9 % IJ SOLN
3.0000 mL | Freq: Two times a day (BID) | INTRAMUSCULAR | Status: DC
  Administered 2014-01-14: 3 mL via INTRAVENOUS

## 2014-01-14 MED ORDER — DEXMEDETOMIDINE HCL 200 MCG/2ML IV SOLN
INTRAVENOUS | Status: DC | PRN
  Administered 2014-01-14: 8 ug via INTRAVENOUS
  Administered 2014-01-14: 4 ug via INTRAVENOUS

## 2014-01-14 MED ORDER — SODIUM CHLORIDE 0.9 % IJ SOLN: 3.0000 mL | INTRAMUSCULAR | Status: DC | PRN

## 2014-01-14 MED ORDER — CYCLOBENZAPRINE HCL 10 MG PO TABS
10.0000 mg | ORAL_TABLET | Freq: Three times a day (TID) | ORAL | Status: DC | PRN
  Administered 2014-01-14 – 2014-01-15 (×5): 10 mg via ORAL
  Filled 2014-01-14 (×5): qty 1

## 2014-01-14 MED ORDER — IRBESARTAN 300 MG PO TABS
300.0000 mg | ORAL_TABLET | Freq: Every day | ORAL | Status: DC
  Administered 2014-01-15 – 2014-01-17 (×3): 300 mg via ORAL
  Filled 2014-01-14 (×4): qty 1

## 2014-01-14 MED ORDER — BUPIVACAINE HCL (PF) 0.25 % IJ SOLN
INTRAMUSCULAR | Status: DC | PRN
  Administered 2014-01-14: 20 mL

## 2014-01-14 MED ORDER — ACETAMINOPHEN 650 MG RE SUPP: 650.0000 mg | RECTAL | Status: DC | PRN

## 2014-01-14 MED ORDER — ROCURONIUM BROMIDE 100 MG/10ML IV SOLN
INTRAVENOUS | Status: DC | PRN
  Administered 2014-01-14: 5 mg via INTRAVENOUS
  Administered 2014-01-14: 45 mg via INTRAVENOUS
  Administered 2014-01-14: 20 mg via INTRAVENOUS
  Administered 2014-01-14 (×2): 10 mg via INTRAVENOUS

## 2014-01-14 MED ORDER — VECURONIUM BROMIDE 10 MG IV SOLR
INTRAVENOUS | Status: DC | PRN
  Administered 2014-01-14: 2 mg via INTRAVENOUS

## 2014-01-14 MED ORDER — FLEET ENEMA 7-19 GM/118ML RE ENEM
1.0000 | ENEMA | Freq: Once | RECTAL | Status: AC | PRN
  Filled 2014-01-14: qty 1

## 2014-01-14 MED ORDER — FENTANYL CITRATE 0.05 MG/ML IJ SOLN
INTRAMUSCULAR | Status: AC
  Filled 2014-01-14: qty 5

## 2014-01-14 MED ORDER — FUROSEMIDE 10 MG/ML IJ SOLN
INTRAMUSCULAR | Status: AC
Start: 2014-01-14 — End: 2014-01-14
  Filled 2014-01-14: qty 2

## 2014-01-14 MED ORDER — PHENOL 1.4 % MT LIQD: 1.0000 | OROMUCOSAL | Status: DC | PRN

## 2014-01-14 MED ORDER — STERILE WATER FOR INJECTION IJ SOLN
INTRAMUSCULAR | Status: AC
Start: 2014-01-14 — End: 2014-01-14
  Filled 2014-01-14: qty 10

## 2014-01-14 MED ORDER — HEMOSTATIC AGENTS (NO CHARGE) OPTIME
TOPICAL | Status: DC | PRN
  Administered 2014-01-14: 1 via TOPICAL

## 2014-01-14 MED ORDER — SODIUM CHLORIDE 0.9 % IV SOLN: 250.0000 mL | INTRAVENOUS | Status: DC

## 2014-01-14 MED ORDER — ACETAMINOPHEN 10 MG/ML IV SOLN
INTRAVENOUS | Status: DC | PRN
  Administered 2014-01-14: 1000 mg via INTRAVENOUS

## 2014-01-14 MED ORDER — NEOSTIGMINE METHYLSULFATE 10 MG/10ML IV SOLN
INTRAVENOUS | Status: DC | PRN
  Administered 2014-01-14: 5 mg via INTRAVENOUS

## 2014-01-14 MED ORDER — ACETAMINOPHEN 10 MG/ML IV SOLN
INTRAVENOUS | Status: AC
  Filled 2014-01-14: qty 100

## 2014-01-14 MED ORDER — PROPOFOL 10 MG/ML IV BOLUS
INTRAVENOUS | Status: DC | PRN
  Administered 2014-01-14: 200 mg via INTRAVENOUS

## 2014-01-14 MED ORDER — OXYCODONE-ACETAMINOPHEN 5-325 MG PO TABS
1.0000 | ORAL_TABLET | ORAL | Status: DC | PRN
  Administered 2014-01-14 – 2014-01-18 (×16): 2 via ORAL
  Filled 2014-01-14 (×16): qty 2

## 2014-01-14 MED ORDER — GLYCOPYRROLATE 0.2 MG/ML IJ SOLN
INTRAMUSCULAR | Status: DC | PRN
  Administered 2014-01-14: .8 mg via INTRAVENOUS

## 2014-01-14 MED ORDER — FENTANYL CITRATE 0.05 MG/ML IJ SOLN
INTRAMUSCULAR | Status: AC
  Filled 2014-01-14: qty 2

## 2014-01-14 MED ORDER — ONDANSETRON HCL 4 MG/2ML IJ SOLN
INTRAMUSCULAR | Status: AC
  Filled 2014-01-14: qty 2

## 2014-01-14 MED ORDER — SUCCINYLCHOLINE CHLORIDE 20 MG/ML IJ SOLN
INTRAMUSCULAR | Status: DC | PRN
  Administered 2014-01-14: 140 mg via INTRAVENOUS

## 2014-01-14 MED ORDER — PHENYLEPHRINE HCL 10 MG/ML IJ SOLN
10.0000 mg | INTRAVENOUS | Status: DC | PRN
  Administered 2014-01-14: 20 ug/min via INTRAVENOUS

## 2014-01-14 MED ORDER — POLYETHYLENE GLYCOL 3350 17 G PO PACK
17.0000 g | PACK | Freq: Every day | ORAL | Status: DC | PRN
  Filled 2014-01-14: qty 1

## 2014-01-14 MED ORDER — THROMBIN 20000 UNITS EX SOLR
CUTANEOUS | Status: DC | PRN
  Administered 2014-01-14: 09:00:00 via TOPICAL

## 2014-01-14 MED ORDER — PROPOFOL 10 MG/ML IV BOLUS
INTRAVENOUS | Status: AC
Start: 2014-01-14 — End: 2014-01-14
  Filled 2014-01-14: qty 20

## 2014-01-14 MED ORDER — CEFAZOLIN SODIUM 1-5 GM-% IV SOLN
1.0000 g | Freq: Three times a day (TID) | INTRAVENOUS | Status: AC
  Administered 2014-01-14 (×2): 1 g via INTRAVENOUS
  Filled 2014-01-14 (×2): qty 50

## 2014-01-14 MED ORDER — DEXAMETHASONE SODIUM PHOSPHATE 10 MG/ML IJ SOLN
INTRAMUSCULAR | Status: DC | PRN
  Administered 2014-01-14: 10 mg via INTRAVENOUS

## 2014-01-14 MED ORDER — ONDANSETRON HCL 4 MG/2ML IJ SOLN: 4.0000 mg | INTRAMUSCULAR | Status: DC | PRN

## 2014-01-14 MED ORDER — ARTIFICIAL TEARS OP OINT
TOPICAL_OINTMENT | OPHTHALMIC | Status: DC | PRN
  Administered 2014-01-14: 1 via OPHTHALMIC

## 2014-01-14 MED ORDER — CYCLOBENZAPRINE HCL 10 MG PO TABS
ORAL_TABLET | ORAL | Status: AC
Start: 2014-01-14 — End: 2014-01-14
  Filled 2014-01-14: qty 1

## 2014-01-14 MED ORDER — MIDAZOLAM HCL 2 MG/2ML IJ SOLN
INTRAMUSCULAR | Status: AC
  Filled 2014-01-14: qty 2

## 2014-01-14 MED ORDER — METAXALONE 800 MG PO TABS
800.0000 mg | ORAL_TABLET | Freq: Three times a day (TID) | ORAL | Status: DC
  Administered 2014-01-14 – 2014-01-18 (×12): 800 mg via ORAL
  Filled 2014-01-14 (×14): qty 1

## 2014-01-14 MED ORDER — TRIAMTERENE-HCTZ 75-50 MG PO TABS
1.0000 | ORAL_TABLET | Freq: Every day | ORAL | Status: DC
  Administered 2014-01-14 – 2014-01-17 (×4): 1 via ORAL
  Filled 2014-01-14 (×5): qty 1

## 2014-01-14 MED ORDER — METFORMIN HCL 500 MG PO TABS
1000.0000 mg | ORAL_TABLET | Freq: Two times a day (BID) | ORAL | Status: DC
  Administered 2014-01-14 – 2014-01-18 (×8): 1000 mg via ORAL
  Filled 2014-01-14 (×10): qty 2

## 2014-01-14 MED ORDER — 0.9 % SODIUM CHLORIDE (POUR BTL) OPTIME
TOPICAL | Status: DC | PRN
  Administered 2014-01-14: 1000 mL

## 2014-01-14 MED ORDER — MENTHOL 3 MG MT LOZG: 1.0000 | LOZENGE | OROMUCOSAL | Status: DC | PRN

## 2014-01-14 MED ORDER — BISACODYL 10 MG RE SUPP: 10.0000 mg | Freq: Every day | RECTAL | Status: DC | PRN

## 2014-01-14 MED ORDER — FENTANYL CITRATE 0.05 MG/ML IJ SOLN
25.0000 ug | INTRAMUSCULAR | Status: DC | PRN
  Administered 2014-01-14 (×3): 50 ug via INTRAVENOUS

## 2014-01-14 MED ORDER — SODIUM CHLORIDE 0.9 % IR SOLN
Status: DC | PRN
  Administered 2014-01-14: 09:00:00

## 2014-01-14 MED ORDER — VANCOMYCIN HCL 1000 MG IV SOLR
INTRAVENOUS | Status: AC
  Filled 2014-01-14: qty 1000

## 2014-01-14 MED ORDER — MEPERIDINE HCL 25 MG/ML IJ SOLN: 6.2500 mg | INTRAMUSCULAR | Status: DC | PRN

## 2014-01-14 MED ORDER — ARTIFICIAL TEARS OP OINT
TOPICAL_OINTMENT | OPHTHALMIC | Status: AC
  Filled 2014-01-14: qty 3.5

## 2014-01-14 MED ORDER — MIDAZOLAM HCL 5 MG/5ML IJ SOLN
INTRAMUSCULAR | Status: DC | PRN
  Administered 2014-01-14: 2 mg via INTRAVENOUS

## 2014-01-14 MED ORDER — EPHEDRINE SULFATE 50 MG/ML IJ SOLN
INTRAMUSCULAR | Status: AC
Start: 2014-01-14 — End: 2014-01-14
  Filled 2014-01-14: qty 1

## 2014-01-14 MED ORDER — FENTANYL CITRATE 0.05 MG/ML IJ SOLN
INTRAMUSCULAR | Status: DC | PRN
  Administered 2014-01-14: 25 ug via INTRAVENOUS
  Administered 2014-01-14: 150 ug via INTRAVENOUS

## 2014-01-14 MED ORDER — CELECOXIB 200 MG PO CAPS
200.0000 mg | ORAL_CAPSULE | Freq: Two times a day (BID) | ORAL | Status: DC
  Administered 2014-01-14 – 2014-01-18 (×8): 200 mg via ORAL
  Filled 2014-01-14 (×9): qty 1

## 2014-01-14 MED ORDER — ACETAMINOPHEN 325 MG PO TABS
650.0000 mg | ORAL_TABLET | ORAL | Status: DC | PRN
  Administered 2014-01-16 – 2014-01-18 (×4): 325 mg via ORAL
  Filled 2014-01-14 (×4): qty 2

## 2014-01-14 MED ORDER — PHENYLEPHRINE HCL 10 MG/ML IJ SOLN
INTRAMUSCULAR | Status: DC | PRN
  Administered 2014-01-14 (×4): 80 ug via INTRAVENOUS

## 2014-01-14 MED ORDER — ALUM & MAG HYDROXIDE-SIMETH 200-200-20 MG/5ML PO SUSP: 30.0000 mL | Freq: Four times a day (QID) | ORAL | Status: DC | PRN

## 2014-01-14 MED ORDER — LIDOCAINE HCL (CARDIAC) 20 MG/ML IV SOLN
INTRAVENOUS | Status: AC
  Filled 2014-01-14: qty 5

## 2014-01-14 MED ORDER — LIDOCAINE HCL (CARDIAC) 20 MG/ML IV SOLN
INTRAVENOUS | Status: DC | PRN
  Administered 2014-01-14: 100 mg via INTRAVENOUS

## 2014-01-14 MED ORDER — ONDANSETRON HCL 4 MG/2ML IJ SOLN
INTRAMUSCULAR | Status: DC | PRN
  Administered 2014-01-14: 4 mg via INTRAVENOUS

## 2014-01-14 MED ORDER — SUCCINYLCHOLINE CHLORIDE 20 MG/ML IJ SOLN
INTRAMUSCULAR | Status: AC
  Filled 2014-01-14: qty 1

## 2014-01-14 MED ORDER — HYDROMORPHONE HCL PF 1 MG/ML IJ SOLN
0.5000 mg | INTRAMUSCULAR | Status: DC | PRN
  Administered 2014-01-14 (×2): 1 mg via INTRAVENOUS
  Filled 2014-01-14 (×2): qty 1

## 2014-01-14 MED ORDER — ROCURONIUM BROMIDE 50 MG/5ML IV SOLN
INTRAVENOUS | Status: AC
  Filled 2014-01-14: qty 2

## 2014-01-14 MED ORDER — EPHEDRINE SULFATE 50 MG/ML IJ SOLN
INTRAMUSCULAR | Status: DC | PRN
  Administered 2014-01-14: 5 mg via INTRAVENOUS

## 2014-01-14 MED ORDER — LACTATED RINGERS IV SOLN
INTRAVENOUS | Status: DC | PRN
  Administered 2014-01-14 (×2): via INTRAVENOUS

## 2014-01-14 MED ORDER — HYDROCODONE-ACETAMINOPHEN 5-325 MG PO TABS: 1.0000 | ORAL_TABLET | ORAL | Status: DC | PRN

## 2014-01-14 MED ORDER — SENNA 8.6 MG PO TABS
1.0000 | ORAL_TABLET | Freq: Two times a day (BID) | ORAL | Status: DC
  Administered 2014-01-14 – 2014-01-17 (×7): 8.6 mg via ORAL
  Filled 2014-01-14 (×8): qty 1

## 2014-01-14 SURGICAL SUPPLY — 70 items
ADH SKN CLS APL DERMABOND .7 (GAUZE/BANDAGES/DRESSINGS) ×1
ADH SKN CLS LQ APL DERMABOND (GAUZE/BANDAGES/DRESSINGS) ×1
APL SKNCLS STERI-STRIP NONHPOA (GAUZE/BANDAGES/DRESSINGS) ×1
APL SRG 60D 8 XTD TIP BNDBL (TIP) ×1
BAG DECANTER FOR FLEXI CONT (MISCELLANEOUS) ×3 IMPLANT
BENZOIN TINCTURE PRP APPL 2/3 (GAUZE/BANDAGES/DRESSINGS) ×3 IMPLANT
BLADE SURG ROTATE 9660 (MISCELLANEOUS) IMPLANT
BRUSH SCRUB EZ PLAIN DRY (MISCELLANEOUS) ×3 IMPLANT
BUR MATCHSTICK NEURO 3.0 LAGG (BURR) ×3 IMPLANT
CAGE PEEK 10X26 (Cage) ×2 IMPLANT
CANISTER SUCT 3000ML (MISCELLANEOUS) ×3 IMPLANT
CAP LCK SPNE (Orthopedic Implant) ×4 IMPLANT
CAP LOCK SPINE RADIUS (Orthopedic Implant) IMPLANT
CAP LOCKING (Orthopedic Implant) ×12 IMPLANT
CLOSURE WOUND 1/2 X4 (GAUZE/BANDAGES/DRESSINGS) ×1
CONT SPEC 4OZ CLIKSEAL STRL BL (MISCELLANEOUS) ×6 IMPLANT
COVER BACK TABLE 24X17X13 BIG (DRAPES) IMPLANT
COVER TABLE BACK 60X90 (DRAPES) ×3 IMPLANT
DECANTER SPIKE VIAL GLASS SM (MISCELLANEOUS) ×3 IMPLANT
DERMABOND ADHESIVE PROPEN (GAUZE/BANDAGES/DRESSINGS) ×2
DERMABOND ADVANCED (GAUZE/BANDAGES/DRESSINGS) ×2
DERMABOND ADVANCED .7 DNX12 (GAUZE/BANDAGES/DRESSINGS) ×1 IMPLANT
DERMABOND ADVANCED .7 DNX6 (GAUZE/BANDAGES/DRESSINGS) IMPLANT
DRAPE C-ARM 42X72 X-RAY (DRAPES) ×6 IMPLANT
DRAPE LAPAROTOMY 100X72X124 (DRAPES) ×3 IMPLANT
DRAPE POUCH INSTRU U-SHP 10X18 (DRAPES) ×3 IMPLANT
DRAPE PROXIMA HALF (DRAPES) IMPLANT
DRAPE SURG 17X23 STRL (DRAPES) ×12 IMPLANT
DRSG OPSITE POSTOP 4X8 (GAUZE/BANDAGES/DRESSINGS) ×2 IMPLANT
DURAPREP 26ML APPLICATOR (WOUND CARE) ×3 IMPLANT
DURASEAL APPLICATOR TIP (TIP) ×2 IMPLANT
DURASEAL SPINE SEALANT 3ML (MISCELLANEOUS) ×2 IMPLANT
ELECT REM PT RETURN 9FT ADLT (ELECTROSURGICAL) ×3
ELECTRODE REM PT RTRN 9FT ADLT (ELECTROSURGICAL) ×1 IMPLANT
EVACUATOR 1/8 PVC DRAIN (DRAIN) ×3 IMPLANT
GAUZE SPONGE 4X4 12PLY STRL (GAUZE/BANDAGES/DRESSINGS) ×3 IMPLANT
GAUZE SPONGE 4X4 16PLY XRAY LF (GAUZE/BANDAGES/DRESSINGS) IMPLANT
GLOVE ECLIPSE 9.0 STRL (GLOVE) ×6 IMPLANT
GLOVE EXAM NITRILE LRG STRL (GLOVE) IMPLANT
GLOVE EXAM NITRILE MD LF STRL (GLOVE) IMPLANT
GLOVE EXAM NITRILE XL STR (GLOVE) IMPLANT
GLOVE EXAM NITRILE XS STR PU (GLOVE) IMPLANT
GOWN STRL REUS W/ TWL LRG LVL3 (GOWN DISPOSABLE) IMPLANT
GOWN STRL REUS W/ TWL XL LVL3 (GOWN DISPOSABLE) ×2 IMPLANT
GOWN STRL REUS W/TWL 2XL LVL3 (GOWN DISPOSABLE) IMPLANT
GOWN STRL REUS W/TWL LRG LVL3 (GOWN DISPOSABLE)
GOWN STRL REUS W/TWL XL LVL3 (GOWN DISPOSABLE) ×6
KIT BASIN OR (CUSTOM PROCEDURE TRAY) ×3 IMPLANT
KIT ROOM TURNOVER OR (KITS) ×3 IMPLANT
NEEDLE HYPO 22GX1.5 SAFETY (NEEDLE) ×3 IMPLANT
NS IRRIG 1000ML POUR BTL (IV SOLUTION) ×3 IMPLANT
PACK LAMINECTOMY NEURO (CUSTOM PROCEDURE TRAY) ×3 IMPLANT
PATTIES SURGICAL 1X1 (DISPOSABLE) ×2 IMPLANT
ROD 50MM (Rod) ×6 IMPLANT
ROD SPNL 50X5.5XNS TI RDS (Rod) IMPLANT
SCREW 6.75X45MM (Screw) ×4 IMPLANT
SPONGE SURGIFOAM ABS GEL 100 (HEMOSTASIS) ×3 IMPLANT
STRIP CLOSURE SKIN 1/2X4 (GAUZE/BANDAGES/DRESSINGS) ×3 IMPLANT
SUT PROLENE 5 0 C1 (SUTURE) ×2 IMPLANT
SUT VIC AB 0 CT1 18XCR BRD8 (SUTURE) ×2 IMPLANT
SUT VIC AB 0 CT1 8-18 (SUTURE) ×12
SUT VIC AB 2-0 CT1 18 (SUTURE) ×7 IMPLANT
SUT VIC AB 3-0 SH 8-18 (SUTURE) ×10 IMPLANT
SYR 20ML ECCENTRIC (SYRINGE) ×3 IMPLANT
TOWEL OR 17X24 6PK STRL BLUE (TOWEL DISPOSABLE) ×3 IMPLANT
TOWEL OR 17X26 10 PK STRL BLUE (TOWEL DISPOSABLE) ×3 IMPLANT
TRAY FOLEY CATH 14FRSI W/METER (CATHETERS) ×3 IMPLANT
TRAY FOLEY CATH 16FRSI W/METER (SET/KITS/TRAYS/PACK) ×2 IMPLANT
WATER STERILE IRR 1000ML POUR (IV SOLUTION) ×3 IMPLANT
WEDGE TANGENT 10X26MM ×2 IMPLANT

## 2014-01-14 NOTE — Progress Notes (Signed)
Utilization review completed.  

## 2014-01-14 NOTE — Anesthesia Postprocedure Evaluation (Signed)
  Anesthesia Post-op Note  Patient: [name]  Procedure(s) Performed: Procedure(s): LUMBAR TWO TO THREE POSTERIOR LUMBAR FUSION 1 LEVEL (N/A)  Patient Location: PACU  Anesthesia Type:General  Level of Consciousness: awake, alert  and oriented  Airway and Oxygen Therapy: Patient Spontanous Breathing and Patient connected to nasal cannula oxygen  Post-op Pain: mild  Post-op Assessment: Post-op Vital signs reviewed, Patient's Cardiovascular Status Stable, Respiratory Function Stable, Patent Airway and No signs of Nausea or vomiting  Post-op Vital Signs: Reviewed and stable  Last Vitals:  Filed Vitals:   01/14/14 1147  BP:   Pulse: 84  Temp:   Resp: 17    Complications: No apparent anesthesia complications

## 2014-01-14 NOTE — Anesthesia Procedure Notes (Signed)
Procedure Name: Intubation Date/Time: 01/14/2014 7:57 AM Performed by: Delia Chimes E Pre-anesthesia Checklist: Patient identified, Timeout performed, Emergency Drugs available, Suction available and Patient being monitored Patient Re-evaluated:Patient Re-evaluated prior to inductionOxygen Delivery Method: Circle system utilized Preoxygenation: Pre-oxygenation with 100% oxygen Intubation Type: IV induction Ventilation: Mask ventilation without difficulty Laryngoscope Size: Mac and 3 Grade View: Grade III Tube type: Oral Tube size: 8.0 mm Number of attempts: 2 Airway Equipment and Method: Stylet Placement Confirmation: breath sounds checked- equal and bilateral and positive ETCO2 Secured at: 23 cm Tube secured with: Tape Dental Injury: Injury to lip  Difficulty Due To: Difficulty was anticipated, Difficult Airway- due to anterior larynx and Difficult Airway-  due to edematous airway Future Recommendations: Recommend- induction with short-acting agent, and alternative techniques readily available Comments: DL x 1 with MAC 3 by Delia Chimes, CRNA unsuccessful yielded Grade III View.  DL x 1 by Dr. Berneice Heinrich with MAC 3 yielded successful intubation but stated blind intubation under epiglottis; +EtCO2, B/L breath sounds, and + chest rise.  Pt. Had excessive redundant tissue and anterior larynx.  Small laceration to lower lip; lubricant applied.  Pt. Suctioned for blood-tinged secretions in posterior oropharynx.

## 2014-01-14 NOTE — Brief Op Note (Signed)
01/14/2014  10:57 AM  PATIENT:  [name]  38 y.o. male  PRE-OPERATIVE DIAGNOSIS:  stenosis  POST-OPERATIVE DIAGNOSIS:  stenosis  PROCEDURE:  Procedure(s): LUMBAR TWO TO THREE POSTERIOR LUMBAR FUSION 1 LEVEL (N/A)  SURGEON:  Surgeon(s) and Role:    * Temple Pacini, MD - Primary    * Lisbeth Renshaw, MD - Assisting  PHYSICIAN ASSISTANT:   ASSISTANTS:    ANESTHESIA:   general  EBL:  Total I/O In: 2725 [I.V.:2500; Blood:225] Out: 850 [Urine:150; Blood:700]  BLOOD ADMINISTERED:none  DRAINS: none   LOCAL MEDICATIONS USED:  MARCAINE     SPECIMEN:  No Specimen  DISPOSITION OF SPECIMEN:  N/A  COUNTS:  YES  TOURNIQUET:  * No tourniquets in log *  DICTATION: .Dragon Dictation  PLAN OF CARE: Admit to inpatient   PATIENT DISPOSITION:  PACU - hemodynamically stable.   Delay start of Pharmacological VTE agent (>24hrs) due to surgical blood loss or risk of bleeding: yes

## 2014-01-14 NOTE — Op Note (Signed)
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  L2-3 posterior lumbar interbody fusion utilizing Tangent interbody allograft wedge, interbody peek cage and morselized locally harvested autograft.  L2-3 posterior lateral arthrodesis utilizing nonsegmental pedicle screw instrumentation.  Reexploration at L3-L5 posterior lateral fusion with removal of hardware   Surgeon:Monda Chastain A.Fahd Galea, M.D.  Asst. Surgeon: Conchita Paris  Anesthesia: General  Indication: 38-year-old male status post previous L3-L5 decompression and fusion with good results presents with severe back and lower extremity pain consistent with neurogenic claudication. Workup demonstrates evidence for complete myelographic block at L2-3 secondary to facet arthropathy and ligamentous buckling and broad-based disc protrusion. Patient presents now for decompression and fusion of some burning symptoms.  Operative note: After induction of anesthesia, patient positioned prone on the Wilson frame an appropriately padded. Patient's lumbar region prepped and draped. Incision made from L2 down to L5. Subperiosteal dissection performed bilaterally. Retractor placed. Previously placed pedicle screws mentation dissected free, dissembled, and removed. Sutures are left in place at L3. Fusion from L3-L5 explored and found to be quite solid. Decompressive laminectomy performed at L2-3 using Leksell rongeurs Kerrison rongeurs the high-speed drill to remove the entire lamina of L2 entire inferior facets of L2 bilateral. Residual aspect of the L3 facets bilaterally. Wide decompressive foraminotomies performed course exiting L2 and L3 nerve  roots bilaterally. Small dural laceration along the take off of the L3 nerve root which was oversewn with a 5-0 Prolene in a watertight fashion. Bilateral discectomies performed at L2-3. Disc space distracted up to a 10 mm. 10 mm distractor left the patient's left side. Thecal sac and nerve root. On the right side. The space reamed and then cut with 10 mm tangent instruments. Soft tissue removed the interspace. 10 x 26 mm tangent wedge impacted in place and recessed approximately 1-2 mm the posterior cortical margin of L2. Distractor in her removed from the left side. He does not restriction left side. The space was again reamed and then cut. Soft tissue repair space. The space further curettage. Morselized autograft packed in the interspace for later fusion. A 10 x 26 mm Telamon cage packed with morselized autograft was impacted into place and recessed approximately 1 mm for posterior margin of L2. Pedicles of L2 were identified using surface landmarks and intraoperative fluoroscopy or superficial bone overlying the pedicle was removed using a high-speed drill. The was then probed using pedicle awl each pedicle tract was then probed and found to be solidly within the bone. A 6.75 x 45 mm radius bur and screws from [employer] were then placed bilaterally at L2. Transverse processes of L2 and L3 were decorticated using a high-speed drill. Morselized autograft packed posterior lateral. Short segment rod placed of the screws at L2 and L3 the locked catheter the screws with locking caps and engaged with costophrenic compression. Final images revealed good position milligrams the hardware at the proper level was normal alignment spine very was irrigated one final time. Hemostasis was assured. Gelfoam was placed over the laminectomy defect. There still was placed over the dural repair. Wounds and closed in layers of Vicryl sutures. 1 g of vancomycin powder was placed in the soft tissues during closure. Steri-Strips  and sterile dressing were applied. There were no apparent complications. Patient tolerated  the procedure well and he returns to recovery postop.

## 2014-01-14 NOTE — Transfer of Care (Signed)
Immediate Anesthesia Transfer of Care Note  Patient: [name]  Procedure(s) Performed: Procedure(s): LUMBAR TWO TO THREE POSTERIOR LUMBAR FUSION 1 LEVEL (N/A)  Patient Location: PACU  Anesthesia Type:General  Level of Consciousness: awake, alert , oriented and patient cooperative  Airway & Oxygen Therapy: Patient Spontanous Breathing and Patient connected to face mask oxygen  Post-op Assessment: Report given to PACU RN, Post -op Vital signs reviewed and stable and Patient moving all extremities  Post vital signs: Reviewed and stable  Complications: No apparent anesthesia complications

## 2014-01-14 NOTE — H&P (Signed)
[name] is an 38 y.o. male.   Chief Complaint: Back and bilateral leg pain HPI: 38 year old male status post previous L3-L5 decompression and fusion with good results. Patient presents now after work related injury with severe back and bilateral lower extremity pain and weakness right greater than left. 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 Patient's failed conservative management. Patient presents now for L2-3 decompression and fusion.  Past Medical History  Diagnosis Date  . Hypertension   . Diabetes mellitus without complication   . Arthritis   . Anemia     Past Surgical History  Procedure Laterality Date  . Tonsillectomy  2005     for sleep apnea  . Wisdom tooth extraction    . Back surgery      History reviewed. No pertinent family history. Social History:  reports that he quit smoking about 18 months ago. His smoking use included Cigarettes. He has a 10 pack-year smoking history. He does not have any smokeless tobacco history on file. He reports that he does not drink alcohol or use illicit drugs.  Allergies:  Allergies  Allergen Reactions  . Bee Venom Swelling    Swelling primarily at sting site, but can be all over.  Epi pen prn    Medications Prior to Admission  Medication Sig Dispense Refill  . aspirin EC 81 MG tablet Take 81 mg by mouth.      . celecoxib (CELEBREX) 200 MG capsule Take 200 mg by mouth 2 (two) times daily.      . diclofenac (VOLTAREN) 75 MG EC tablet Take 75 mg by mouth 2 (two) times daily.      Marland Kitchen EPINEPHrine (EPIPEN 2-PAK) 0.3 mg/0.3 mL IJ SOAJ injection Inject 0.3 mg into your thigh or buttocks after a bee sting and then call 911      . lidocaine (XYLOCAINE) 5 % ointment Apply 1 application topically 4 (four) times daily as needed for mild pain or moderate pain.      . metaxalone (SKELAXIN) 800 MG tablet Take 800 mg by mouth 3 (three) times daily.      . metFORMIN (GLUCOPHAGE) 1000 MG  tablet Take 1,000 mg by mouth 2 (two) times daily.       . Omega-3 Fatty Acids (FISH OIL) 1000 MG CAPS Take 3,000 mg by mouth daily.      Marland Kitchen oxyCODONE-acetaminophen (PERCOCET/ROXICET) 5-325 MG per tablet Take 2 tablets by mouth every 6 (six) hours as needed for severe pain.      Marland Kitchen telmisartan (MICARDIS) 80 MG tablet Take 80 mg by mouth daily.       Marland Kitchen triamterene-hydrochlorothiazide (MAXZIDE) 75-50 MG per tablet Take 1 tablet by mouth daily.        Results for orders placed during the hospital encounter of 01/14/14 (from the past 48 hour(s))  GLUCOSE, CAPILLARY     Status: Abnormal   Collection Time    01/14/14  6:20 AM      Result Value Ref Range   Glucose-Capillary 104 (*) 70 - 99 mg/dL  CBC WITH DIFFERENTIAL     Status: Abnormal (Preliminary result)   Collection Time    01/14/14  6:53 AM      Result Value Ref Range   WBC PENDING  4.0 - 10.5 K/uL   RBC 4.13 (*) 4.22 - 5.81 MIL/uL   Hemoglobin 12.7 (*) 13.0 - 17.0 g/dL   HCT 74.2 (*) 59.5 - 63.8 %   MCV 91.8  78.0 - 100.0 fL   MCH 30.8  26.0 - 34.0 pg   MCHC 33.5  30.0 - 36.0 g/dL   RDW 16.1  09.6 - 04.5 %   Platelets PENDING  150 - 400 K/uL   Neutrophils Relative % PENDING  43 - 77 %   Neutro Abs PENDING  1.7 - 7.7 K/uL   Band Neutrophils PENDING  0 - 10 %   Lymphocytes Relative PENDING  12 - 46 %   Lymphs Abs PENDING  0.7 - 4.0 K/uL   Monocytes Relative PENDING  3 - 12 %   Monocytes Absolute PENDING  0.1 - 1.0 K/uL   Eosinophils Relative PENDING  0 - 5 %   Eosinophils Absolute PENDING  0.0 - 0.7 K/uL   Basophils Relative PENDING  0 - 1 %   Basophils Absolute PENDING  0.0 - 0.1 K/uL   WBC Morphology PENDING     RBC Morphology PENDING     Smear Review PENDING     nRBC PENDING  0 /100 WBC   Metamyelocytes Relative PENDING     Myelocytes PENDING     Promyelocytes Absolute PENDING     Blasts PENDING     No results found.  Review of Systems  Constitutional: Negative.   HENT: Negative.   Eyes: Negative.   Respiratory:  Negative.   Cardiovascular: Negative.   Gastrointestinal: Negative.   Genitourinary: Negative.   Musculoskeletal: Negative.   Skin: Negative.   Neurological: Negative.   Endo/Heme/Allergies: Negative.   Psychiatric/Behavioral: Negative.     Blood pressure 115/68, pulse 78, temperature 97.1 F (36.2 C), temperature source Oral, resp. rate 20, height  (1.88 m), weight 159.666 kg (352 lb), SpO2 95.00%. Physical Exam  Constitutional: He is oriented to person, place, and time. He appears well-developed and well-nourished. No distress.  HENT:  Head: Normocephalic and atraumatic.  Right Ear: External ear normal.  Left Ear: External ear normal.  Nose: Nose normal.  Mouth/Throat: Oropharynx is clear and moist. No oropharyngeal exudate.  Eyes: Conjunctivae and EOM are normal. Pupils are equal, round, and reactive to light. Left eye exhibits no discharge.  Neck: Normal range of motion. Neck supple. No tracheal deviation present. No thyromegaly present.  Cardiovascular: Normal rate, regular rhythm, normal heart sounds and intact distal pulses.   No murmur heard. Respiratory: Effort normal and breath sounds normal. No respiratory distress. He has no wheezes.  GI: Soft. Bowel sounds are normal. He exhibits no distension. There is no tenderness.  Musculoskeletal: Normal range of motion. He exhibits no edema and no tenderness.  Neurological: He is alert and oriented to person, place, and time. He has normal reflexes. He displays normal reflexes. No cranial nerve deficit. He exhibits normal muscle tone. Coordination normal.  Skin: Skin is warm and dry. He is not diaphoretic.  Psychiatric: He has a normal mood and affect. His behavior is normal. Judgment and thought content normal.     Assessment/Plan L2-3 adjacent level herniated nucleus pulposus and stenosis with severe back pain and neurogenic claudication. Plan reexploration of previous lumbar fusion with L2-3 decompression and fusion  utilizing interbody peek cage, local harvested autograft and structural allograft coupled with posterior lateral arthrodesis utilizing segmental pedicle screw is mentation and local autograft. Risks and benefits been explained. Patient wishes to proceed.  Abisai Deer A 01/14/2014, 7:42 AM

## 2014-01-15 LAB — GLUCOSE, CAPILLARY
GLUCOSE-CAPILLARY: 149 mg/dL — AB (ref 70–99)
GLUCOSE-CAPILLARY: 128 mg/dL — AB (ref 70–99)
Glucose-Capillary: 134 mg/dL — ABNORMAL HIGH (ref 70–99)
Glucose-Capillary: 158 mg/dL — ABNORMAL HIGH (ref 70–99)

## 2014-01-15 NOTE — Care Management Note (Signed)
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     Action/Plan:   Case manager spoke with patient concerning DME needs at discharge. Patient is under worker's comp. CM contaacted adjuster Jerolyn Center [phone], faxed orders to her at [phone]. Patient needs Bariatric RW and 3in1.   Anticipated DC Date:  01/16/2014   Anticipated DC Plan:  HOME/SELF CARE      DC Planning Services  CM consult      PAC Choice  DURABLE MEDICAL EQUIPMENT   Choice offered to / List presented to:     DME arranged  WALKER - ROLLING      DME agency  OTHER - SEE NOTE     HH arranged  NA      Status of service:  Completed, signed off Medicare Important Message given?   (If response is "NO", the following Medicare IM given date fields will be blank) Date Medicare IM given:   Medicare IM given by:   Date Additional Medicare IM given:   Additional Medicare IM given by:    Discharge Disposition:  HOME/SELF CARE  Per UR Regulation:  Reviewed for med. necessity/level of care/duration of stay  If discussed at Long Length of Stay Meetings, dates discussed:    Comments:  01/15/14 11:26am Vance Peper, RN BSN Case Manager CM requested that patient's walker be delivered to patient's room here at the hospital and that the bariatric 3in1 be delivered to patient's home.

## 2014-01-15 NOTE — Evaluation (Signed)
Physical Therapy Evaluation Patient Details Name: [name] MRN: [id number] DOB: 12/21/1975 Today's Date: 01/15/2014   History of Present Illness  38 year old male status post previous L3-L5 decompression and fusion with good results. Patient presents now after work related injury with severe back and bilateral lower extremity pain and weakness right greater than left. 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 Patient's failed conservative management. Patient presents now for L2-3 decompression and fusion  Clinical Impression  Pt mobilizing safely with RW, continues to have pain in back and around left hip to groin. Continued to reinforce precautions and safe posture and mvmt patterns. May need OP PT after cleared by MD for increased activity. No further acute PT needs at his time. Pt mod I with ambulation and performed stairs with supervision.     Follow Up Recommendations No PT follow up;Supervision - Intermittent    Equipment Recommendations  Rolling walker with 5" wheels (bariatric)    Recommendations for Other Services       Precautions / Restrictions Precautions Precautions: Back;Fall Precaution Booklet Issued: Yes (comment) Precaution Comments: Reviewed precautions  Required Braces or Orthoses: Spinal Brace Spinal Brace: Lumbar corset;Applied in sitting position Restrictions Weight Bearing Restrictions: No      Mobility  Bed Mobility               General bed mobility comments: pt received sitting EOB  Transfers Overall transfer level: Modified independent Equipment used: Rolling walker (2 wheeled) Transfers: Sit to/from Stand Sit to Stand: Modified independent (Device/Increase time)         General transfer comment: pt performed transfer multiple times with safety  Ambulation/Gait Ambulation/Gait assistance: Modified independent (Device/Increase time) Ambulation Distance (Feet): 400 Feet Assistive  device: Rolling walker (2 wheeled) Gait Pattern/deviations: Decreased weight shift to left;Trunk flexed;Antalgic Gait velocity: decreased   General Gait Details: frequent vc's for erect posture, pt with antalgic gait pattern, avoidance of left side  Stairs Stairs: Yes Stairs assistance: Supervision Stair Management: One rail Right;Step to pattern;Forwards Number of Stairs: 5 General stair comments: discussed least painful pattern as well as opposite pattern for strengthening as pain decreased. Pt safe on stairs with use of rail  Wheelchair Mobility    Modified Rankin (Stroke Patients Only)       Balance Overall balance assessment: Modified Independent                                           Pertinent Vitals/Pain Pain Assessment: 0-10 Pain Score: 8  Pain Location: back Pain Descriptors / Indicators: Aching Pain Intervention(s): Monitored during session;Premedicated before session    Home Living Family/patient expects to be discharged to:: Private residence Living Arrangements: Spouse/significant other Available Help at Discharge: Family;Available PRN/intermittently Type of Home: House Home Access: Stairs to enter Entrance Stairs-Rails: Doctor, general practice of Steps: 4 Home Layout: One level Home Equipment: Adaptive equipment;Tub bench;Cane - single point      Prior Function Level of Independence: Needs assistance   Gait / Transfers Assistance Needed: independent  ADL's / Homemaking Assistance Needed: wife assisted with LB ADLs.  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     Hand Dominance   Dominant Hand: Right    Extremity/Trunk Assessment   Upper Extremity Assessment: Overall WFL for tasks assessed           Lower Extremity  Assessment: RLE deficits/detail;LLE deficits/detail RLE Deficits / Details: WFL LLE Deficits / Details: continues to have some lateral hip pain to the groin and aviods full  wt bearing left side  Cervical / Trunk Assessment: Kyphotic  Communication   Communication: No difficulties  Cognition Arousal/Alertness: Awake/alert Behavior During Therapy: WFL for tasks assessed/performed Overall Cognitive Status: Within Functional Limits for tasks assessed                      General Comments      Exercises        Assessment/Plan    PT Assessment Patent does not need any further PT services  PT Diagnosis     PT Problem List    PT Treatment Interventions     PT Goals (Current goals can be found in the Care Plan section) Acute Rehab PT Goals Patient Stated Goal: return home PT Goal Formulation: No goals set, d/c therapy    Frequency     Barriers to discharge        Co-evaluation               End of Session Equipment Utilized During Treatment: Back brace Activity Tolerance: Patient tolerated treatment well Patient left: with call bell/phone within reach;with nursing/sitter in room Nurse Communication: Mobility status         Time: 1410-1438 PT Time Calculation (min): 28 min   Charges:   PT Evaluation $Initial PT Evaluation Tier I: 1 Procedure PT Treatments $Gait Training: 23-37 mins   PT G Codes:        Lyanne Co, PT  Acute Rehab Services  [phone]   G. L. Garci­a, [place] 01/15/2014, 3:14 PM

## 2014-01-15 NOTE — Evaluation (Signed)
Occupational Therapy Evaluation Patient Details Name: [name] MRN: [id number] DOB: 22-Jan-1976 Today's Date: 01/15/2014    History of Present Illness 38 year old male status post previous L3-L5 decompression and fusion with good results. Patient presents now after work related injury with severe back and bilateral lower extremity pain and weakness right greater than left. 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 Patient's failed conservative management. Patient presents now for L2-3 decompression and fusion   Clinical Impression   Pt s/p above. Pt independent with ADLs, PTA. Feel pt will benefit from acute OT to increase independence prior to d/c.     Follow Up Recommendations  No OT follow up;Supervision - Intermittent    Equipment Recommendations  Other (comment) (bariatric 3 in 1)    Recommendations for Other Services       Precautions / Restrictions Precautions Precautions: Back;Fall Precaution Booklet Issued: No Precaution Comments: Reviewed precautions  Required Braces or Orthoses: Spinal Brace Spinal Brace: Lumbar corset;Applied in sitting position Restrictions Weight Bearing Restrictions: No      Mobility Bed Mobility Overal bed mobility: Needs Assistance Bed Mobility: Sit to Sidelying         Sit to sidelying: Mod assist General bed mobility comments: assistance with LE's.  Transfers Overall transfer level: Needs assistance Equipment used: Rolling walker (2 wheeled) Transfers: Sit to/from Stand Sit to Stand: Min guard         General transfer comment: Min guard for safety    Balance                                            ADL Overall ADL's : Needs assistance/impaired                 Upper Body Dressing : Minimal assistance;Sitting;Standing (brace)   Lower Body Dressing: Min guard;Sit to/from stand;With adaptive equipment   Toilet Transfer: Min  guard;Ambulation;RW (bed)           Functional mobility during ADLs: Min guard;Rolling walker General ADL Comments: Educated on use of cup for teeth care and placement of grooming items to avoid breaking precautions. Reviewed use of AE for LB ADLs and pt practiced. Educated on what pt could use for toilet aide for hygiene if it is an issue. Educated on back brace wear. Educated on safety (rugs, use of bag on walker). Explained tub/car transfer technique     Vision                     Perception     Praxis      Pertinent Vitals/Pain Pain Assessment: 0-10 Pain Score: 8  Pain Location: back  Pain Descriptors / Indicators: Throbbing Pain Intervention(s): Monitored during session;Repositioned     Hand Dominance Right   Extremity/Trunk Assessment Upper Extremity Assessment Upper Extremity Assessment: Overall WFL for tasks assessed   Lower Extremity Assessment Lower Extremity Assessment: Defer to PT evaluation       Communication Communication Communication: No difficulties   Cognition Arousal/Alertness: Awake/alert Behavior During Therapy: WFL for tasks assessed/performed Overall Cognitive Status: Within Functional Limits for tasks assessed                     General Comments       Exercises       Shoulder Instructions      Home Living Family/patient  expects to be discharged to:: Private residence Living Arrangements: Spouse/significant other Available Help at Discharge: Family;Available PRN/intermittently Type of Home: House Home Access: Stairs to enter [employer] of Steps: 4 Entrance Stairs-Rails: Right;Left Home Layout: One level     Bathroom Shower/Tub: Chief Strategy Officer: Handicapped height     Home Equipment: Merchant navy officer;Tub bench;Cane - single point Adaptive Equipment: Reacher;Sock aid;Long-handled shoe horn        Prior Functioning/Environment Level of Independence: Needs assistance     ADL's / Homemaking Assistance Needed: wife assisted with LB ADLs.        OT Diagnosis: Acute pain   OT Problem List: Decreased knowledge of use of DME or AE;Decreased knowledge of precautions;Obesity;Pain;Decreased activity tolerance   OT Treatment/Interventions: Self-care/ADL training;DME and/or AE instruction;Therapeutic activities;Patient/family education;Balance training    OT Goals(Current goals can be found in the care plan section) Acute Rehab OT Goals Patient Stated Goal: not stated OT Goal Formulation: With patient Time For Goal Achievement: 01/22/14 Potential to Achieve Goals: Good ADL Goals Pt Will Perform Grooming: with modified independence;standing Pt Will Transfer to Toilet: with modified independence;ambulating (3 in 1 over commode) Pt Will Perform Toileting - Clothing Manipulation and hygiene: with modified independence;sit to/from stand Additional ADL Goal #1: Pt will perform bed mobility at Mod I level as precursor for ADLs.    OT Frequency: Min 2X/week   Barriers to D/C:            Co-evaluation              End of Session Equipment Utilized During Treatment: Gait belt;Rolling walker;Back brace  Activity Tolerance: Patient limited by pain (became lightheaded) Patient left: in bed;with call bell/phone within reach;with family/visitor present   Time: 8295-6213 OT Time Calculation (min): 29 min Charges:  OT General Charges $OT Visit: 1 Procedure OT Evaluation $Initial OT Evaluation Tier I: 1 Procedure OT Treatments $Self Care/Home Management : 8-22 mins G-CodesEarlie Raveling OTR/L [phone] 01/15/2014, 10:32 AM

## 2014-01-15 NOTE — Progress Notes (Signed)
Postop day 1. Patient doing well overall. Moderate incisional pain which is reasonably well controlled. He's been able to stand and ambulate with minimal assistance. His lower extremity function seems much improved. Having no leg pain. He did have some wound drainage overnight which was bloody in nature. No other problems.  He is awake and alert. His oriented and appropriate. Motor and sensory function are intact. Dressing currently is dry. Chest and abdomen benign.  Overall doing well following L2-3 decompression and fusion. Continue efforts at mobilization. Possible discharge tomorrow.

## 2014-01-16 LAB — GLUCOSE, CAPILLARY
Glucose-Capillary: 104 mg/dL — ABNORMAL HIGH (ref 70–99)
Glucose-Capillary: 109 mg/dL — ABNORMAL HIGH (ref 70–99)
Glucose-Capillary: 131 mg/dL — ABNORMAL HIGH (ref 70–99)
Glucose-Capillary: 98 mg/dL (ref 70–99)

## 2014-01-16 MED FILL — Sodium Chloride IV Soln 0.9%: INTRAVENOUS | Qty: 2000 | Status: AC

## 2014-01-16 MED FILL — Heparin Sodium (Porcine) Inj 1000 Unit/ML: INTRAMUSCULAR | Qty: 30 | Status: AC

## 2014-01-16 NOTE — Progress Notes (Signed)
OT Cancellation Note  Patient Details Name: HYKEEM OJEDA MRN: [id number] DOB: 1976/01/31   Cancelled Treatment:    Reason Eval/Treat Not Completed: Pain limiting ability to participate. Pt reports HA and general feeling of discomfort and declines therapy at this time. MD in room when OT present and pt plans to stay overnight. OT will follow-up with pt as available for treatment.   Rae Lips [phone] 01/16/2014, 9:43 AM

## 2014-01-16 NOTE — Progress Notes (Signed)
Postop day 2. Patient with more back pain today. Some occasional lower extremity pain. Still feels improved from preop however. Patient with some mild postural headache. Wound currently clean and dry.  Motor and sensory intact. Straight raising negative bilaterally. Wound clean and dry. Chest and abdomen benign.  Progressing reasonably well. Continue efforts at mobilization. Possible discharge tomorrow.

## 2014-01-17 LAB — GLUCOSE, CAPILLARY
GLUCOSE-CAPILLARY: 108 mg/dL — AB (ref 70–99)
GLUCOSE-CAPILLARY: 120 mg/dL — AB (ref 70–99)
Glucose-Capillary: 119 mg/dL — ABNORMAL HIGH (ref 70–99)
Glucose-Capillary: 86 mg/dL (ref 70–99)

## 2014-01-17 NOTE — Progress Notes (Signed)
Overall the patient is stable. While lying in bed he is completely comfortable. He is having no radiating pain. He still complains of moderate postural headaches with arising. He was able to ambulate in the hallway today. Does have some feelings of crampiness in his lower 70s with prolonged standing.  He is afebrile. His vitals are stable. His motor and sensory exam are intact. His wound is completely clean and dry.  Patient is progressing slowly falling lumbar decompression and fusion surgery. Postural headaches remain a concern with regard to possible CSF leakage. Overall the wound appears to be healing well and the patient seems to be making some progress. Plan to continue current management for today. Possible discharge tomorrow.

## 2014-01-17 NOTE — Progress Notes (Signed)
Occupational Therapy Treatment Patient Details Name: [name] MRN: [id number] DOB: 1975-07-17 Today's Date: 01/17/2014    History of present illness 38 year old male status post previous L3-L5 decompression and fusion with good results. Patient presents now after work related injury with severe back and bilateral lower extremity pain and weakness right greater than left. 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 Patient's failed conservative management. Patient presents now for L2-3 decompression and fusion   OT comments  Pt continues to demonstrates poor return demo of back precautions with adls. Pt reports pain in bil LE but not providing detail or verbalizing with questioning cues descriptions of type of pain. OT asking throughout session in various methods and pt never providing a clear description or intensity. Recommending BSC bariatric due to poor demo during session.  Pt needs constant reinforcement of precautions with adls due to poor return demo.   Follow Up Recommendations  No OT follow up;Supervision - Intermittent    Equipment Recommendations  3 in 1 bedside comode (bariatric - could be benefical)    Recommendations for Other Services      Precautions / Restrictions Precautions Precautions: Back;Fall Precaution Comments: Reviewed precautions  Required Braces or Orthoses: Spinal Brace Spinal Brace: Lumbar corset;Applied in sitting position Restrictions Weight Bearing Restrictions: No       Mobility Bed Mobility               General bed mobility comments: sitting EOB on arrival  Transfers Overall transfer level: Needs assistance Equipment used: Rolling walker (2 wheeled) Transfers: Sit to/from Stand Sit to Stand: Min guard         General transfer comment: pt requires verbal cues for hand placement with poor return demo    Balance                               High Level Balance  Comments: Pt ambulating > 100 ft and requires frequent rest breaks every 10 ft or so. Pt leaning on RW with flexed forward posture.   ADL Overall ADL's : Needs assistance/impaired Eating/Feeding: Modified independent   Grooming: Oral care;Supervision/safety;Standing Grooming Details (indicate cue type and reason): pt noted to shift weight onto the right leg and lifting Lt LE. Pt asked several times about pain in Lt le. pt without a direct response. Pt only states "its both legs" Upper Body Bathing: Min guard;Sitting Upper Body Bathing Details (indicate cue type and reason): cues for back precautions Lower Body Bathing:  (requires AE)           Toilet Transfer: Minimal assistance;RW;Grab Paramedic Details (indicate cue type and reason): pt requires (A) to descend to toilet and holding wall and grab bar Toileting- Clothing Manipulation and Hygiene: Min guard;Sit to/from stand       Functional mobility during ADLs: Min guard;Rolling walker General ADL Comments: Pt could benefit from bariatric BSC for toilet height. pt with poor return demo of RW safety. pt with poor recall of back precautions. pt sitting on EOB on arrival eating without brace. pt educated on importance of back brace for all OOB task. pt with poor recall of precautiosn with adls. Pt needs reinforcement. pt pulling with UE to get up instead of pushing from seated position.       Vision                     Perception  Praxis      Cognition   Behavior During Therapy: WFL for tasks assessed/performed Overall Cognitive Status: Within Functional Limits for tasks assessed                       Extremity/Trunk Assessment               Exercises     Shoulder Instructions       General Comments      Pertinent Vitals/ Pain       Pain Assessment: 0-10 Pain Score: 6  Pain Location: bil LEs- pt asked to describe pain without a clear response. pt provided cueing "is  the pain shooting throbbing grabbing cramping on and off?" pt states "all i can tell you is that dr pool took a bone out and thats why i have this problem. thats where the pain is"  Pain Descriptors / Indicators:  (pt states "i can only tell you that Dr pool took a bone out") Pain Intervention(s): Repositioned  Home Living                                          Prior Functioning/Environment              Frequency Min 2X/week     Progress Toward Goals  OT Goals(current goals can now be found in the care plan section)  Progress towards OT goals: Progressing toward goals  Acute Rehab OT Goals Patient Stated Goal: return home OT Goal Formulation: With patient Time For Goal Achievement: 01/22/14 Potential to Achieve Goals: Good ADL Goals Pt Will Perform Grooming: with modified independence;standing Pt Will Transfer to Toilet: with modified independence;ambulating Pt Will Perform Toileting - Clothing Manipulation and hygiene: with modified independence;sit to/from stand Additional ADL Goal #1: Pt will perform bed mobility at Mod I level as precursor for ADLs.    Plan Discharge plan remains appropriate    Co-evaluation                 End of Session Equipment Utilized During Treatment: Rolling walker;Gait belt;Back brace   Activity Tolerance Patient limited by pain   Patient Left in bed;with call bell/phone within reach   Nurse Communication Mobility status;Precautions        Time: 1610-9604 OT Time Calculation (min): 54 min  Charges: OT General Charges $OT Visit: 1 Procedure OT Treatments $Self Care/Home Management : 53-67 mins  [name] 01/17/2014, 11:57 AM Pager: [phone]

## 2014-01-17 NOTE — Care Management Note (Signed)
01/17/14   3:00pm Vance Peper, RN BSN Case Manager CM faxed progress notes to patient's adjuster - Neita Carp- Fax: [phone], phone [phone]

## 2014-01-18 LAB — GLUCOSE, CAPILLARY: GLUCOSE-CAPILLARY: 128 mg/dL — AB (ref 70–99)

## 2014-01-18 MED ORDER — METAXALONE 800 MG PO TABS: 800.0000 mg | ORAL_TABLET | Freq: Three times a day (TID) | ORAL | Status: DC

## 2014-01-18 MED ORDER — HYDROCODONE-ACETAMINOPHEN 5-325 MG PO TABS: 1.0000 | ORAL_TABLET | ORAL | Status: DC | PRN

## 2014-01-18 MED ORDER — OXYCODONE-ACETAMINOPHEN 5-325 MG PO TABS: 1.0000 | ORAL_TABLET | Freq: Four times a day (QID) | ORAL | Status: DC | PRN

## 2014-01-18 NOTE — Care Management Note (Addendum)
    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     Action/Plan:   Case manager spoke with patient concerning DME needs at discharge. Patient is under worker's comp. CM contaacted adjuster Jerolyn Center [phone], faxed orders to her at [phone]. Patient needs Bariatric RW and 3in1.   Anticipated DC Date:  01/18/2014   Anticipated DC Plan:  [name]      DC Planning Services  CM consult      [facility] Choice  DURABLE MEDICAL EQUIPMENT   Choice offered to / List presented to:     DME arranged  WALKER - ROLLING  3-N-1      DME agency  OTHER - SEE NOTE     HH arranged  NA      HH agency  OTHER - SEE NOTE   Status of service:  Completed, signed off Medicare Important Message given?   (If response is "NO", the following Medicare IM given date fields will be blank) Date Medicare IM given:   Medicare IM given by:   Date Additional Medicare IM given:   Additional Medicare IM given by:    Discharge Disposition:  HOME/SELF CARE  Per UR Regulation:  Reviewed for med. necessity/level of care/duration of stay  If discussed at Long Length of Stay Meetings, dates discussed:    Comments:  01/18/14 12:37 CM received callback from Brett Canales [facility] Adjuster) who states he received the fax information from me and he would set up the home health with the [facility] of Tuesday 01/21/14.  No other CM needs were communicated.  Freddy Jaksch, BSN, CM [phone]. 10:00 CM received call from unit RN, Aishatu stating MD wants [facility] to be Tuesday for pt and please arrange with Workmen's Comp. CM reported to Aishatu WC may not  be reachable until normal business hours on Monday and was the MD aware services would probably not be started until Tuesday.  Aishatu states MD an pt aware and SOC is not desired until Tuesday anyway.   CM called Neita Carp and requested a callback.  CM faxed the [facility] orders and Discharge summary with request of [facility] 01/21/14 to the fax [phone].  Callback information for Brett Canales stated on Fax cover sheet.  Aishatu states pt has DME and will discharge as pt understands his Workmen's Comp Adjuster will arrange for [facility] on Tuesday 01/21/14.  No other CM needs were communicated.  Freddy Jaksch, BSN, [place] [phone].  01/17/14   3:00pm Vance Peper, RN BSN Case Manager CM faxed progress notes to patient's adjuster - Neita Carp- Fax: [phone], phone [phone]   01/15/14 11:26am Vance Peper, RN BSN Case Manager CM requested that patient's walker be delivered to patient's room here at the hospital and that the bariatric 3in1 be delivered to patient's home.

## 2014-01-18 NOTE — Progress Notes (Addendum)
Patient alert and oriented, mae's well, voiding adequate amount of urine, swallowing without difficulty, with c/o moderate pain and medications given prior to discharged. Patient discharged home with family. Script and discharged instructions given to patient. RN informed patient and sister the importance of calling MD office if drainage noted form incision site. Patient and family stated understanding of d/c instructions given and agree to call the office to schedule f/u appointment. Aisha RN

## 2014-01-18 NOTE — Discharge Summary (Signed)
Physician Discharge Summary  Patient ID: [name] MRN: [id number] DOB/AGE: 1976/01/20 38 y.o.  Admit date: 01/14/2014 Discharge date: 01/18/2014  Admission Diagnoses: Lumbar spondylosis   Discharge Diagnoses: Same   Discharged Condition: good  Hospital Course: The patient was admitted on 01/14/2014 and taken to the operating room where the patient underwent lumbar fusion. The patient tolerated the procedure well and was taken to the recovery room and then to the floor in stable condition. The hospital course was routine. There were no complications. The wound remained clean dry and intact. Pt had appropriate back soreness. No complaints of leg pain or new N/T/W. The patient remained afebrile with stable vital signs, and tolerated a regular diet. The patient continued to increase activities, and pain was well controlled with oral pain medications.   Consults: None  Significant Diagnostic Studies:  Results for orders placed during the hospital encounter of 01/14/14  CBC WITH DIFFERENTIAL      Result Value Ref Range   WBC 8.7  4.0 - 10.5 K/uL   RBC 4.13 (*) 4.22 - 5.81 MIL/uL   Hemoglobin 12.7 (*) 13.0 - 17.0 g/dL   HCT 40.9 (*) 81.1 - 91.4 %   MCV 91.8  78.0 - 100.0 fL   MCH 30.8  26.0 - 34.0 pg   MCHC 33.5  30.0 - 36.0 g/dL   RDW 78.2  95.6 - 21.3 %   Platelets 348  150 - 400 K/uL   Neutrophils Relative % 48  43 - 77 %   Lymphocytes Relative 38  12 - 46 %   Monocytes Relative 8  3 - 12 %   Eosinophils Relative 5  0 - 5 %   Basophils Relative 1  0 - 1 %   Neutro Abs 4.2  1.7 - 7.7 K/uL   Lymphs Abs 3.3  0.7 - 4.0 K/uL   Monocytes Absolute 0.7  0.1 - 1.0 K/uL   Eosinophils Absolute 0.4  0.0 - 0.7 K/uL   Basophils Absolute 0.1  0.0 - 0.1 K/uL   Smear Review MORPHOLOGY UNREMARKABLE    GLUCOSE, CAPILLARY      Result Value Ref Range   Glucose-Capillary 104 (*) 70 - 99 mg/dL  GLUCOSE, CAPILLARY      Result Value Ref Range   Glucose-Capillary 128 (*) 70 - 99 mg/dL  GLUCOSE,  CAPILLARY      Result Value Ref Range   Glucose-Capillary 149 (*) 70 - 99 mg/dL  GLUCOSE, CAPILLARY      Result Value Ref Range   Glucose-Capillary 169 (*) 70 - 99 mg/dL   Comment 1 Notify RN     Comment 2 Documented in Chart    GLUCOSE, CAPILLARY      Result Value Ref Range   Glucose-Capillary 186 (*) 70 - 99 mg/dL  GLUCOSE, CAPILLARY      Result Value Ref Range   Glucose-Capillary 138 (*) 70 - 99 mg/dL  GLUCOSE, CAPILLARY      Result Value Ref Range   Glucose-Capillary 149 (*) 70 - 99 mg/dL   Comment 1 Notify RN     Comment 2 Documented in Chart    GLUCOSE, CAPILLARY      Result Value Ref Range   Glucose-Capillary 134 (*) 70 - 99 mg/dL  GLUCOSE, CAPILLARY      Result Value Ref Range   Glucose-Capillary 128 (*) 70 - 99 mg/dL   Comment 1 Notify RN     Comment 2 Documented in Chart  GLUCOSE, CAPILLARY      Result Value Ref Range   Glucose-Capillary 158 (*) 70 - 99 mg/dL  GLUCOSE, CAPILLARY      Result Value Ref Range   Glucose-Capillary 104 (*) 70 - 99 mg/dL  GLUCOSE, CAPILLARY      Result Value Ref Range   Glucose-Capillary 109 (*) 70 - 99 mg/dL  GLUCOSE, CAPILLARY      Result Value Ref Range   Glucose-Capillary 131 (*) 70 - 99 mg/dL  GLUCOSE, CAPILLARY      Result Value Ref Range   Glucose-Capillary 98  70 - 99 mg/dL  GLUCOSE, CAPILLARY      Result Value Ref Range   Glucose-Capillary 119 (*) 70 - 99 mg/dL  GLUCOSE, CAPILLARY      Result Value Ref Range   Glucose-Capillary 108 (*) 70 - 99 mg/dL  GLUCOSE, CAPILLARY      Result Value Ref Range   Glucose-Capillary 86  70 - 99 mg/dL  GLUCOSE, CAPILLARY      Result Value Ref Range   Glucose-Capillary 120 (*) 70 - 99 mg/dL  GLUCOSE, CAPILLARY      Result Value Ref Range   Glucose-Capillary 128 (*) 70 - 99 mg/dL   Comment 1 Documented in Chart      Dg Chest 2 View  01/09/2014   CLINICAL DATA:  Hypertension.  Preoperative for lumbar fusion.  EXAM: CHEST  2 VIEW  COMPARISON:  None.  FINDINGS: The heart size and  mediastinal contours are within normal limits. There is no focal infiltrate, pulmonary edema, or pleural effusion. The visualized skeletal structures are unremarkable.  IMPRESSION: No active cardiopulmonary disease.   Electronically Signed   By: Sherian Rein M.D.   On: 01/09/2014 16:27   Dg Lumbar Spine 2-3 Views  01/14/2014   CLINICAL DATA:  L2-3 PLIF  EXAM: DG C-ARM 61-120 MIN; LUMBAR SPINE - 2-3 VIEW  COMPARISON:  07/30/2012 and CT of the lumbar spine on 10/11/2013  FINDINGS: AP and lateral views are performed, showing transpedicular screws and interbody fusion device. Posterior retractor is in place. By history this is at L2-3. Transpedicular screws at L4 and L5 have been removed. Interbody fusion devices are identified at L3-4 and L4-5.  IMPRESSION: Intraoperative images during L2-3 PLIF.   Electronically Signed   By: Rosalie Gums M.D.   On: 01/14/2014 11:46   Dg C-arm 1-60 Min  01/14/2014   CLINICAL DATA:  L2-3 PLIF  EXAM: DG C-ARM 61-120 MIN; LUMBAR SPINE - 2-3 VIEW  COMPARISON:  07/30/2012 and CT of the lumbar spine on 10/11/2013  FINDINGS: AP and lateral views are performed, showing transpedicular screws and interbody fusion device. Posterior retractor is in place. By history this is at L2-3. Transpedicular screws at L4 and L5 have been removed. Interbody fusion devices are identified at L3-4 and L4-5.  IMPRESSION: Intraoperative images during L2-3 PLIF.   Electronically Signed   By: Rosalie Gums M.D.   On: 01/14/2014 11:46    Antibiotics:  Anti-infectives   Start     Dose/Rate Route Frequency Ordered Stop   01/14/14 1400  ceFAZolin (ANCEF) IVPB 1 g/50 mL premix     1 g 100 mL/hr over 30 Minutes Intravenous Every 8 hours 01/14/14 1241 01/14/14 2152   01/14/14 1029  vancomycin (VANCOCIN) 1000 MG powder    Comments:  Suzanna Obey   : cabinet override      01/14/14 1029 01/14/14 2244   01/14/14 0839  bacitracin 50,000 Units in sodium  chloride irrigation 0.9 % 500 mL irrigation  Status:   Discontinued       As needed 01/14/14 0839 01/14/14 1108   01/14/14 0600  ceFAZolin (ANCEF) 3 g in dextrose 5 % 50 mL IVPB     3 g 160 mL/hr over 30 Minutes Intravenous On call to O.R. 01/13/14 1330 01/14/14 0825      Discharge Exam: Blood pressure 107/71, pulse 96, temperature 98.5 F (36.9 C), temperature source Oral, resp. rate 18, height  (1.88 m), weight 159.666 kg (352 lb), SpO2 92.00%. Neurologic: Grossly normal Incision clean dry and intact  Discharge Medications:     Medication List    STOP taking these medications       celecoxib 200 MG capsule  Commonly known as:  CELEBREX     diclofenac 75 MG EC tablet  Commonly known as:  VOLTAREN      TAKE these medications       aspirin EC 81 MG tablet  Take 81 mg by mouth.     EPIPEN 2-PAK 0.3 mg/0.3 mL Soaj injection  Generic drug:  EPINEPHrine  Inject 0.3 mg into your thigh or buttocks after a bee sting and then call 911     Fish Oil 1000 MG Caps  Take 3,000 mg by mouth daily.     lidocaine 5 % ointment  Commonly known as:  XYLOCAINE  Apply 1 application topically 4 (four) times daily as needed for mild pain or moderate pain.     metaxalone 800 MG tablet  Commonly known as:  SKELAXIN  Take 1 tablet (800 mg total) by mouth 3 (three) times daily.     metFORMIN 1000 MG tablet  Commonly known as:  GLUCOPHAGE  Take 1,000 mg by mouth 2 (two) times daily.     oxyCODONE-acetaminophen 5-325 MG per tablet  Commonly known as:  PERCOCET/ROXICET  Take 1-2 tablets by mouth every 6 (six) hours as needed for severe pain.     telmisartan 80 MG tablet  Commonly known as:  MICARDIS  Take 80 mg by mouth daily.     triamterene-hydrochlorothiazide 75-50 MG per tablet  Commonly known as:  MAXZIDE  Take 1 tablet by mouth daily.        Disposition: Home   Final Dx: Lumbar fusion      Discharge Instructions   Call MD for:  difficulty breathing, headache or visual disturbances    Complete by:  As directed       Call MD for:  persistant dizziness or light-headedness    Complete by:  As directed      Call MD for:  persistant nausea and vomiting    Complete by:  As directed      Call MD for:  redness, tenderness, or signs of infection (pain, swelling, redness, odor or green/yellow discharge around incision site)    Complete by:  As directed      Call MD for:  severe uncontrolled pain    Complete by:  As directed      Call MD for:  temperature >100.4    Complete by:  As directed      Diet - low sodium heart healthy    Complete by:  As directed      Discharge instructions    Complete by:  As directed   May shower normally, no strenuous activity, no driving, no bending twisting or heavy lifting     Increase activity slowly    Complete by:  As  directed      Remove dressing in 48 hours    Complete by:  As directed               Signed: Jemya Depierro S 01/18/2014, 9:01 AM

## 2014-01-18 NOTE — Progress Notes (Signed)
Occupational Therapy Treatment Patient Details Name: [name] MRN: [id number] DOB: 13-Jul-1975 Today's Date: 01/18/2014    History of present illness 38 year old male status post previous L3-L5 decompression and fusion with good results. Patient presents now after work related injury with severe back and bilateral lower extremity pain and weakness right greater than left. 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 Patient's failed conservative management. Patient presents now for L2-3 decompression and fusion   OT comments  Pt limited by pain. Education provided during session.   Follow Up Recommendations  Home health OT;Supervision/Assistance - 24 hour    Equipment Recommendations  3 in 1 bedside comode;Other (comment) (bariatric 3 in 1; long sponge and shoehorn and toilet aide)    Recommendations for Other Services      Precautions / Restrictions Precautions Precautions: Back;Fall Precaution Comments: Pt able to state 3/3 back precautions Required Braces or Orthoses: Spinal Brace Spinal Brace: Lumbar corset;Applied in sitting position Restrictions Weight Bearing Restrictions: No       Mobility Bed Mobility Overal bed mobility: Needs Assistance Bed Mobility: Rolling;Sidelying to Sit Rolling: Supervision Sidelying to sit: Min guard       General bed mobility comments: encouraged pt to try to practice bed mobility with HOB flat and without rails, but pt says he will have pillows at home to elevate his head. OT lowered bed some.   Transfers Overall transfer level: Needs assistance Equipment used: Rolling walker (2 wheeled) Transfers: Sit to/from Stand Sit to Stand: Min guard         General transfer comment: Min guard for safety    Balance                                   ADL Overall ADL's : Needs assistance/impaired     Grooming: Oral care;Min guard;Standing Grooming Details (indicate cue  type and reason): cues for precautions         Upper Body Dressing : Min guard;Sitting;Standing   Lower Body Dressing: Minimal assistance;Sit to/from stand;With adaptive equipment   Toilet Transfer: Min guard;Ambulation;RW (bed)           Functional mobility during ADLs: Min guard;Rolling walker General ADL Comments: OT assisted with donning shoes (did not have shoe horn to use). Educated on use of bag on walker. Reviewed to use cup for teeth care and placement of grooming items. Recommended pt use walker in bathroom when at home (did not do in session). Cues for precautions in session.  Educated on back brace wear.      Vision                     Perception     Praxis      Cognition   Behavior During Therapy: WFL for tasks assessed/performed Overall Cognitive Status: Within Functional Limits for tasks assessed                       Extremity/Trunk Assessment               Exercises     Shoulder Instructions       General Comments      Pertinent Vitals/ Pain       Pain Assessment: 0-10 Pain Score: 7  Pain Location: legs Pain Intervention(s): Monitored during session  Home Living  Prior Functioning/Environment              Frequency Min 2X/week     Progress Toward Goals  OT Goals(current goals can now be found in the care plan section)  Progress towards OT goals: Progressing toward goals  Acute Rehab OT Goals Patient Stated Goal: not stated OT Goal Formulation: With patient Time For Goal Achievement: 01/22/14 Potential to Achieve Goals: Good ADL Goals Pt Will Perform Grooming: with modified independence;standing Pt Will Perform Lower Body Dressing: with modified independence;with adaptive equipment;sit to/from stand Pt Will Transfer to Toilet: with modified independence;ambulating Pt Will Perform Toileting - Clothing Manipulation and hygiene: with modified  independence;sit to/from stand Additional ADL Goal #1: Pt will perform bed mobility at Mod I level as precursor for ADLs.    Plan Discharge plan needs to be updated    Co-evaluation                 End of Session Equipment Utilized During Treatment: Gait belt;Rolling walker;Back brace   Activity Tolerance Patient limited by pain   Patient Left in bed;with call bell/phone within reach   Nurse Communication Mobility status        Time: 1000-1019 OT Time Calculation (min): 19 min  Charges: OT General Charges $OT Visit: 1 Procedure OT Treatments $Self Care/Home Management : 8-22 mins  Earlie Raveling OTR/L [phone] 01/18/2014, 10:33 AM

## 2014-01-18 NOTE — Discharge Instructions (Signed)
Wound Care Keep incision covered and dry for two days.  If you shower, cover incision with plastic wrap.  Do not put any creams, lotions, or ointments on incision. Leave steri-strips on back.  They will fall off by themselves. Activity Walk each and every day, increasing distance each day. No lifting greater than 5 lbs.  Avoid excessive neck motion. No driving for 2 weeks; may ride as a passenger locally. If provided with back brace, wear when out of bed.  It is not necessary to wear brace in bed. Diet Resume your normal diet.  Return to Work Will be discussed at you follow up appointment. Call Your Doctor If Any of These Occur Redness, drainage, or swelling at the wound.  Temperature greater than 101 degrees. Severe pain not relieved by pain medication. Incision starts to come apart. Follow Up Appt Call today for appointment in 1-2 weeks ([phone]) or for problems.  If you have any hardware placed in your spine, you will need an x-ray before your appointment. ,

## 2014-01-30 ENCOUNTER — Ambulatory Visit (INDEPENDENT_AMBULATORY_CARE_PROVIDER_SITE_OTHER): Payer: BC Managed Care – PPO | Admitting: Podiatry

## 2014-01-30 ENCOUNTER — Ambulatory Visit: Payer: Self-pay | Admitting: Podiatry

## 2014-01-30 ENCOUNTER — Encounter: Payer: Self-pay | Admitting: Podiatry

## 2014-01-30 VITALS — BP 111/75 | HR 86 | Resp 16 | Ht 74.0 in | Wt 352.0 lb

## 2014-01-30 DIAGNOSIS — B359 Dermatophytosis, unspecified: Secondary | ICD-10-CM

## 2014-01-30 DIAGNOSIS — B351 Tinea unguium: Secondary | ICD-10-CM

## 2014-01-30 DIAGNOSIS — M79609 Pain in unspecified limb: Secondary | ICD-10-CM

## 2014-01-30 MED ORDER — NAFTIFINE HCL 2 % EX CREA: TOPICAL_CREAM | CUTANEOUS | Status: DC

## 2014-01-31 NOTE — Progress Notes (Signed)
He presents today complaining of painful toenails and dry skin to the left foot.  Objective: Pulses are stable he is alert and oriented x3. Has recently had back surgery and is not longer able to take care of his feet properly. Pulses are palpable bilateral. Dry xerotic skin with tinea pedis plantar medial aspect of the left foot. Pulses are palpable. 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  Assessment: Pain in limb secondary to onychomycosis and tinea pedis.  Plan: Prescribed Naftin cream or his left foot also debrided nails 1 through 5 bilateral will followup with him in 2-3 months.  

## 2014-04-02 ENCOUNTER — Ambulatory Visit: Payer: BC Managed Care – PPO | Admitting: Podiatry

## 2014-04-07 ENCOUNTER — Ambulatory Visit (INDEPENDENT_AMBULATORY_CARE_PROVIDER_SITE_OTHER): Payer: BC Managed Care – PPO | Admitting: Podiatry

## 2014-04-07 DIAGNOSIS — B351 Tinea unguium: Secondary | ICD-10-CM

## 2014-04-07 DIAGNOSIS — M79609 Pain in unspecified limb: Secondary | ICD-10-CM

## 2014-04-07 MED ORDER — INDOMETHACIN 50 MG PO CAPS: 50.0000 mg | ORAL_CAPSULE | Freq: Two times a day (BID) | ORAL | Status: DC

## 2014-04-07 NOTE — Progress Notes (Signed)
He presents today complaining of painful toenails and dry skin to the left foot.  Objective: Pulses are stable he is alert and oriented x3. Has recently had back surgery and is not longer able to take care of his feet properly. Pulses are palpable bilateral. Dry xerotic skin with tinea pedis plantar medial aspect of the left foot. Pulses are palpable. 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  Assessment: Pain in limb secondary to onychomycosis and tinea pedis.  Plan: Prescribed Naftin cream or his left foot also debrided nails 1 through 5 bilateral will followup with him in 2-3 months.

## 2014-07-07 ENCOUNTER — Ambulatory Visit (INDEPENDENT_AMBULATORY_CARE_PROVIDER_SITE_OTHER): Payer: BLUE CROSS/BLUE SHIELD | Admitting: Podiatry

## 2014-07-07 DIAGNOSIS — B351 Tinea unguium: Secondary | ICD-10-CM

## 2014-07-07 DIAGNOSIS — M79609 Pain in unspecified limb: Secondary | ICD-10-CM

## 2014-07-07 NOTE — Progress Notes (Signed)
He presents today complaining of painful toenails.  Objective: Pulses are stable he is alert and oriented x3. Has recently had back surgery and is not longer able to take care of his feet properly. Pulses are palpable bilateral. Pulses are palpable. 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  Assessment: Pain in limb secondary to onychomycosis.  Plan: Debrided nails 1 through 5 bilateral will followup with him in 2-3 months.

## 2014-07-07 NOTE — Patient Instructions (Signed)
Diabetes and Foot Care Diabetes may cause you to have problems because of poor blood supply (circulation) to your feet and legs. This may cause the skin on your feet to become thinner, break easier, and heal more slowly. Your skin may become dry, and the skin may peel and crack. You may also have nerve damage in your legs and feet causing decreased feeling in them. You may not notice minor injuries to your feet that could lead to infections or more serious problems. Taking care of your feet is one of the most important things you can do for yourself.  HOME CARE INSTRUCTIONS  Wear shoes at all times, even in the house. Do not go barefoot. Bare feet are easily injured.  Check your feet daily for blisters, cuts, and redness. If you cannot see the bottom of your feet, use a mirror or ask someone for help.  Wash your feet with warm water (do not use hot water) and mild soap. Then pat your feet and the areas between your toes until they are completely dry. Do not soak your feet as this can dry your skin.  Apply a moisturizing lotion or petroleum jelly (that does not contain alcohol and is unscented) to the skin on your feet and to dry, brittle toenails. Do not apply lotion between your toes.  Trim your toenails straight across. Do not dig under them or around the cuticle. File the edges of your nails with an emery board or nail file.  Do not cut corns or calluses or try to remove them with medicine.  Wear clean socks or stockings every day. Make sure they are not too tight. Do not wear knee-high stockings since they may decrease blood flow to your legs.  Wear shoes that fit properly and have enough cushioning. To break in new shoes, wear them for just a few hours a day. This prevents you from injuring your feet. Always look in your shoes before you put them on to be sure there are no objects inside.  Do not cross your legs. This may decrease the blood flow to your feet.  If you find a minor scrape,  cut, or break in the skin on your feet, keep it and the skin around it clean and dry. These areas may be cleansed with mild soap and water. Do not cleanse the area with peroxide, alcohol, or iodine.  When you remove an adhesive bandage, be sure not to damage the skin around it.  If you have a wound, look at it several times a day to make sure it is healing.  Do not use heating pads or hot water bottles. They may burn your skin. If you have lost feeling in your feet or legs, you may not know it is happening until it is too late.  Make sure your health care provider performs a complete foot exam at least annually or more often if you have foot problems. Report any cuts, sores, or bruises to your health care provider immediately. SEEK MEDICAL CARE IF:   You have an injury that is not healing.  You have cuts or breaks in the skin.  You have an ingrown nail.  You notice redness on your legs or feet.  You feel burning or tingling in your legs or feet.  You have pain or cramps in your legs and feet.  Your legs or feet are numb.  Your feet always feel cold. SEEK IMMEDIATE MEDICAL CARE IF:   There is increasing redness,   swelling, or pain in or around a wound.  There is a red line that goes up your leg.  Pus is coming from a wound.  You develop a fever or as directed by your health care provider.  You notice a bad smell coming from an ulcer or wound. Document Released: 04/22/2000 Document Revised: 12/26/2012 Document Reviewed: 10/02/2012 [employer] Patient Information 2015 [employer]. This information is not intended to replace advice given to you by your health care provider. Make sure you discuss any questions you have with your health care provider.  

## 2014-10-20 ENCOUNTER — Ambulatory Visit: Payer: BLUE CROSS/BLUE SHIELD

## 2015-07-08 ENCOUNTER — Other Ambulatory Visit: Payer: Self-pay | Admitting: Physician Assistant

## 2015-07-14 ENCOUNTER — Encounter (HOSPITAL_COMMUNITY): Payer: Self-pay

## 2015-07-14 ENCOUNTER — Encounter (HOSPITAL_COMMUNITY)
Admission: RE | Admit: 2015-07-14 | Discharge: 2015-07-14 | Disposition: A | Discharge status: Home / Self Care | Payer: BLUE CROSS/BLUE SHIELD | Source: Ambulatory Visit | Attending: Orthopaedic Surgery | Admitting: Orthopaedic Surgery

## 2015-07-14 DIAGNOSIS — G4733 Obstructive sleep apnea (adult) (pediatric): Secondary | ICD-10-CM | POA: Diagnosis not present

## 2015-07-14 DIAGNOSIS — M1612 Unilateral primary osteoarthritis, left hip: Secondary | ICD-10-CM | POA: Diagnosis not present

## 2015-07-14 DIAGNOSIS — Z01812 Encounter for preprocedural laboratory examination: Secondary | ICD-10-CM | POA: Diagnosis not present

## 2015-07-14 DIAGNOSIS — Z7982 Long term (current) use of aspirin: Secondary | ICD-10-CM | POA: Diagnosis not present

## 2015-07-14 DIAGNOSIS — Z87891 Personal history of nicotine dependence: Secondary | ICD-10-CM | POA: Diagnosis not present

## 2015-07-14 DIAGNOSIS — I10 Essential (primary) hypertension: Secondary | ICD-10-CM | POA: Insufficient documentation

## 2015-07-14 DIAGNOSIS — Z7984 Long term (current) use of oral hypoglycemic drugs: Secondary | ICD-10-CM | POA: Insufficient documentation

## 2015-07-14 DIAGNOSIS — Z01818 Encounter for other preprocedural examination: Secondary | ICD-10-CM | POA: Diagnosis present

## 2015-07-14 DIAGNOSIS — E119 Type 2 diabetes mellitus without complications: Secondary | ICD-10-CM | POA: Diagnosis not present

## 2015-07-14 DIAGNOSIS — Z79899 Other long term (current) drug therapy: Secondary | ICD-10-CM | POA: Insufficient documentation

## 2015-07-14 DIAGNOSIS — R000 Tachycardia, unspecified: Secondary | ICD-10-CM | POA: Diagnosis not present

## 2015-07-14 HISTORY — DX: Unspecified asthma, uncomplicated: J45.909

## 2015-07-14 HISTORY — DX: Sleep apnea, unspecified: G47.30

## 2015-07-14 HISTORY — DX: Personal history of other medical treatment: Z92.89

## 2015-07-14 HISTORY — DX: Angina pectoris, unspecified: I20.9

## 2015-07-14 LAB — CBC
HCT: 45.9 % (ref 39.0–52.0)
Hemoglobin: 14.8 g/dL (ref 13.0–17.0)
MCH: 30.6 pg (ref 26.0–34.0)
MCHC: 32.2 g/dL (ref 30.0–36.0)
MCV: 94.8 fL (ref 78.0–100.0)
PLATELETS: 342 10*3/uL (ref 150–400)
RBC: 4.84 MIL/uL (ref 4.22–5.81)
RDW: 13.6 % (ref 11.5–15.5)
WBC: 8.3 10*3/uL (ref 4.0–10.5)

## 2015-07-14 LAB — BASIC METABOLIC PANEL
ANION GAP: 12 (ref 5–15)
BUN: 19 mg/dL (ref 6–20)
CALCIUM: 9.4 mg/dL (ref 8.9–10.3)
CO2: 26 mmol/L (ref 22–32)
CREATININE: 1.29 mg/dL — AB (ref 0.61–1.24)
Chloride: 103 mmol/L (ref 101–111)
GFR calc non Af Amer: >60 mL/min (ref >60)
Glucose, Bld: 179 mg/dL — ABNORMAL HIGH (ref 65–99)
Potassium: 4.7 mmol/L (ref 3.5–5.1)
SODIUM: 141 mmol/L (ref 135–145)

## 2015-07-14 LAB — SURGICAL PCR SCREEN
MRSA, PCR: NEGATIVE
Staphylococcus aureus: NEGATIVE

## 2015-07-14 LAB — GLUCOSE, CAPILLARY: GLUCOSE-CAPILLARY: 196 mg/dL — AB (ref 65–99)

## 2015-07-14 NOTE — Pre-Procedure Instructions (Signed)
[place]  07/14/2015      [id number] - [place], [place] - [address] AT [facility] OF [address] [place] [address] [place] [address] Phone: [phone] Fax: [phone]  [facility] [address] [place], [place] - [address] AT [facility] OF [address] [place] [address] Phone: [phone] Fax: [phone]    Your procedure is scheduled on Tuesday March 14th.  Report to [facility] Admitting at 745 A.M.  Call this number if you have problems the morning of surgery:  [phone]   Remember:  Do not eat food or drink liquids after midnight Monday March 13th.  Take these medicines the morning of surgery with A SIP OF WATER cyclobenzaprine (flexeril) if needed, oxycodone-acetaminophen (percocet) if needed, tizanidine (zanaflex) if needed  STOP: ALL Vitamins, Supplements, Herbal Medications, Fish Oils, Aspirins, NSAIDs (Nonsteroidal Anti-inflammatories such as Ibuprofen, Aleve, or Advil), and Goody's/BC Powders 7 days prior to surgery, until after surgery as directed by your physician. Stop aspirin, celecoxib (celebrex), diclofenac (voltaren), indomethacin (indocin) today.  Do not take invokana (canaglifozin) the day of surgery.  How to Manage Your Diabetes Before Surgery   Why is it important to control my blood sugar before and after surgery?   Improving blood sugar levels before and after surgery helps healing and can limit problems.  A way of improving blood sugar control is eating a healthy diet by:  - Eating less sugar and carbohydrates  - Increasing activity/exercise  - Talk with your doctor about reaching your blood sugar goals  High blood sugars (greater than 180 mg/dL) can raise your risk of infections and slow down your recovery so you will need to focus on controlling your diabetes during the weeks before surgery.  Make sure that the doctor who takes care of your diabetes knows  about your planned surgery including the date and location.  How do I manage my blood sugars before surgery?   Check your blood sugar at least 4 times a day, 2 days before surgery to make sure that they are not too high or low.   Check your blood sugar the morning of your surgery when you wake up and every 2               hours until you get to the Short-Stay unit.  If your blood sugar is less than 70 mg/dL, you will need to treat for low blood sugar by:  Treat a low blood sugar (less than 70 mg/dL) with 1/2 cup of clear juice (cranberry or apple), 4 glucose tablets, OR glucose gel.  Recheck blood sugar in 15 minutes after treatment (to make sure it is greater than 70 mg/dL).  If blood sugar is not greater than 70 mg/dL on re-check, call [phone] for further instructions.   Report your blood sugar to the Short-Stay nurse when you get to Short-Stay.  References:  [facility], 2007 "How to Manage your Diabetes Before and After Surgery".  What do I do about my diabetes medications?   Do not take oral diabetes medicines (pills) the morning of surgery! Invokana (canagliflozin)   Do not wear jewelry, make-up or nail polish.  Do not wear lotions, powders, or perfumes.  You may wear deodorant.  Men may shave face and neck.  Do not bring valuables to the hospital.  [facility] is not responsible for any  belongings or valuables.  Contacts, dentures or bridgework may not be worn into surgery.  Leave your suitcase in the car.  After surgery it may be brought to your room.  For patients admitted to the hospital, discharge time will be determined by your treatment team.  Patients discharged the day of surgery will not be allowed to drive home.        Preparing [facility] at [facility]  Before surgery, you can play an important role.  Because skin is not sterile, your skin needs to be as free of germs as possible.  You can reduce the number of germs on your  skin by washing with CHG (chlorahexidine gluconate) Soap before surgery.  CHG is an antiseptic cleaner with kills germs and bonds with the skin to continue killing germs even after washing.   Please do not use if you have an allergy to CHG or antibacterial soaps.  If your skin becomes reddened/irritated stop using the CHG.  Do not shave (including legs and underarms) for at least 48 hours prior to first CHG shower.  It is okay to shave your face.  Please follow these instructions carefully:  1. Shower with CHG Soap the night before surgery and the morning of Surgery. 2. If you choose to wash your hair, wash your hair first as usual with your normal shampoo. 3. After you shampoo, rinse your hair and body thoroughly to remove the Shampoo. 4. Use CHG as you would any other liquid soap. You can apply chg directly to the skin and wash gently with scrungie or a clean washcloth. 5. Apply the CHG Soap to your body ONLY FROM THE NECK DOWN. Do not use on open wounds or open sores. Avoid contact with your eyes, ears, mouth and genitals (private parts). Wash genitals (private parts) with your normal soap. 6. Wash thoroughly, paying special attention to the area where your surgery will be performed. 7. Thoroughly rinse your body with warm water from the neck down. 8. DO NOT shower/wash with your normal soap after using and rinsing off the CHG Soap. 9. Pat yourself dry with a clean towel.  10. Wear clean pajamas.  11. Place clean sheets on your bed the night of your first shower and do not sleep with pets.  Day of Surgery  Do not apply any lotions/deodorants the morning of surgery. Please wear clean clothes to the hospital/surgery center.  Please read over the following fact sheets that you were given. Pain Booklet, Coughing and Deep Breathing, Total Joint Packet,  MRSA Information and Surgical Site Infection Prevention

## 2015-07-14 NOTE — Progress Notes (Signed)
Patient had stress/ECHO done by Dr. Adrian Black[place]Shaukat Khan [place] due to chest pain in 2012.  He states tests were normal and hasn't followed up with cardiology since.  Will request records.  Denies heart cath, EKG pending.  pcp is Derwood KaplanEason,  Ernest B, MD

## 2015-07-15 LAB — HEMOGLOBIN A1C
HEMOGLOBIN A1C: 6.6 % — AB (ref 4.8–5.6)
MEAN PLASMA GLUCOSE: 143 mg/dL

## 2015-07-15 NOTE — Progress Notes (Signed)
Anesthesia Chart Review:  Pt is a 40 year old male scheduled for L total hip arthroplasty anterior approach on 07/21/2015 with Dr. Maureen Ralphs. Blackman.   PMH includes:  HTN, DM, OSA, anemia, asthma (as a child). Former smoker. BMI 39. S/p lumbar fusion 01/14/14 and 07/30/12.   Medications include: ASA, canagliflozin, maxzide.   Preoperative labs reviewed.  HgbA1c 6.6, glucose 179.   EKG 07/14/15: Sinus tachycardia (104 bpm). Left atrial abnormality New since previous tracing 01/09/14 per Dr. Thomasene LotGreen's interpretation.   Echo 10/22/10 (correspondence 08/03/12 in media tab):  - Technically difficult study - Mildly dilated LA, RA and LV - Mildly dilated aortic root (3.81cm) - Normal LV systolic function, normal wall motion. Mild LVH.  - Trace TR. Normal pulmonary artery pressure. Trace MR.   Nuclear stress test 10/25/10 (correspondence 08/03/12 in media tab): Normal study with normal EF  If no changes, I anticipate pt can proceed with surgery as scheduled.   Rica Mastngela Damario Gillie, FNP-BC [facility] Short Stay Surgical Center/Anesthesiology Phone: [phone] 07/15/2015 10:59 AM

## 2015-07-20 MED ORDER — DEXTROSE 5 % IV SOLN
3.0000 g | INTRAVENOUS | Status: AC
  Administered 2015-07-21: 3 g via INTRAVENOUS
  Filled 2015-07-20: qty 3000

## 2015-07-20 MED ORDER — TRANEXAMIC ACID 1000 MG/10ML IV SOLN
1000.0000 mg | INTRAVENOUS | Status: AC
  Administered 2015-07-21: 1000 mg via INTRAVENOUS
  Filled 2015-07-20: qty 10

## 2015-07-21 ENCOUNTER — Inpatient Hospital Stay (HOSPITAL_COMMUNITY): Payer: BLUE CROSS/BLUE SHIELD | Admitting: Anesthesiology

## 2015-07-21 ENCOUNTER — Encounter (HOSPITAL_COMMUNITY)
Admission: RE | Disposition: A | Discharge status: Rehabilitation Facility | Payer: Self-pay | Source: Ambulatory Visit | Attending: Orthopaedic Surgery

## 2015-07-21 ENCOUNTER — Inpatient Hospital Stay (HOSPITAL_COMMUNITY)
Admission: RE | Admit: 2015-07-21 | Discharge: 2015-07-24 | DRG: 470 | Disposition: A | Discharge status: Rehabilitation Facility | Payer: BLUE CROSS/BLUE SHIELD | Source: Ambulatory Visit | Attending: Orthopaedic Surgery | Admitting: Orthopaedic Surgery

## 2015-07-21 ENCOUNTER — Encounter (HOSPITAL_COMMUNITY): Payer: Self-pay | Admitting: *Deleted

## 2015-07-21 ENCOUNTER — Inpatient Hospital Stay (HOSPITAL_COMMUNITY): Payer: BLUE CROSS/BLUE SHIELD

## 2015-07-21 ENCOUNTER — Inpatient Hospital Stay (HOSPITAL_COMMUNITY): Payer: BLUE CROSS/BLUE SHIELD | Admitting: Emergency Medicine

## 2015-07-21 DIAGNOSIS — Z87891 Personal history of nicotine dependence: Secondary | ICD-10-CM

## 2015-07-21 DIAGNOSIS — I10 Essential (primary) hypertension: Secondary | ICD-10-CM | POA: Diagnosis present

## 2015-07-21 DIAGNOSIS — Z96642 Presence of left artificial hip joint: Secondary | ICD-10-CM

## 2015-07-21 DIAGNOSIS — Z96649 Presence of unspecified artificial hip joint: Secondary | ICD-10-CM | POA: Diagnosis not present

## 2015-07-21 DIAGNOSIS — R000 Tachycardia, unspecified: Secondary | ICD-10-CM | POA: Diagnosis present

## 2015-07-21 DIAGNOSIS — E118 Type 2 diabetes mellitus with unspecified complications: Secondary | ICD-10-CM | POA: Diagnosis not present

## 2015-07-21 DIAGNOSIS — M1651 Unilateral post-traumatic osteoarthritis, right hip: Secondary | ICD-10-CM | POA: Diagnosis not present

## 2015-07-21 DIAGNOSIS — E1142 Type 2 diabetes mellitus with diabetic polyneuropathy: Secondary | ICD-10-CM | POA: Diagnosis present

## 2015-07-21 DIAGNOSIS — Z91030 Bee allergy status: Secondary | ICD-10-CM | POA: Diagnosis not present

## 2015-07-21 DIAGNOSIS — Z6841 Body Mass Index (BMI) 40.0 and over, adult: Secondary | ICD-10-CM | POA: Diagnosis not present

## 2015-07-21 DIAGNOSIS — M25552 Pain in left hip: Secondary | ICD-10-CM | POA: Diagnosis present

## 2015-07-21 DIAGNOSIS — M1612 Unilateral primary osteoarthritis, left hip: Secondary | ICD-10-CM | POA: Diagnosis present

## 2015-07-21 DIAGNOSIS — M1611 Unilateral primary osteoarthritis, right hip: Secondary | ICD-10-CM | POA: Diagnosis not present

## 2015-07-21 DIAGNOSIS — Z419 Encounter for procedure for purposes other than remedying health state, unspecified: Secondary | ICD-10-CM

## 2015-07-21 DIAGNOSIS — Z471 Aftercare following joint replacement surgery: Secondary | ICD-10-CM | POA: Diagnosis not present

## 2015-07-21 DIAGNOSIS — M1000 Idiopathic gout, unspecified site: Secondary | ICD-10-CM | POA: Diagnosis not present

## 2015-07-21 DIAGNOSIS — R609 Edema, unspecified: Secondary | ICD-10-CM | POA: Diagnosis not present

## 2015-07-21 HISTORY — PX: TOTAL HIP ARTHROPLASTY: SHX124

## 2015-07-21 LAB — GLUCOSE, CAPILLARY
GLUCOSE-CAPILLARY: 177 mg/dL — AB (ref 65–99)
GLUCOSE-CAPILLARY: 203 mg/dL — AB (ref 65–99)

## 2015-07-21 SURGERY — ARTHROPLASTY, HIP, TOTAL, ANTERIOR APPROACH
Anesthesia: General | Site: Hip | Laterality: Left

## 2015-07-21 MED ORDER — DEXTROSE 5 % IV SOLN
500.0000 mg | Freq: Four times a day (QID) | INTRAVENOUS | Status: DC | PRN
  Filled 2015-07-21: qty 5

## 2015-07-21 MED ORDER — GLYCOPYRROLATE 0.2 MG/ML IJ SOLN
INTRAMUSCULAR | Status: AC
  Filled 2015-07-21: qty 4

## 2015-07-21 MED ORDER — METHOCARBAMOL 500 MG PO TABS
500.0000 mg | ORAL_TABLET | Freq: Four times a day (QID) | ORAL | Status: DC | PRN
  Administered 2015-07-21 – 2015-07-23 (×4): 500 mg via ORAL
  Filled 2015-07-21 (×3): qty 1

## 2015-07-21 MED ORDER — SODIUM CHLORIDE 0.9 % IV SOLN
INTRAVENOUS | Status: DC
  Administered 2015-07-21: 17:00:00 via INTRAVENOUS

## 2015-07-21 MED ORDER — LIDOCAINE HCL (CARDIAC) 20 MG/ML IV SOLN
INTRAVENOUS | Status: AC
  Filled 2015-07-21: qty 5

## 2015-07-21 MED ORDER — 0.9 % SODIUM CHLORIDE (POUR BTL) OPTIME
TOPICAL | Status: DC | PRN
  Administered 2015-07-21: 1000 mL

## 2015-07-21 MED ORDER — HYDROMORPHONE HCL 1 MG/ML IJ SOLN
INTRAMUSCULAR | Status: AC
  Administered 2015-07-21: 12:00:00
  Filled 2015-07-21: qty 1

## 2015-07-21 MED ORDER — HYDROMORPHONE HCL 1 MG/ML IJ SOLN
INTRAMUSCULAR | Status: AC
  Filled 2015-07-21: qty 1

## 2015-07-21 MED ORDER — METOCLOPRAMIDE HCL 5 MG/ML IJ SOLN: 5.0000 mg | Freq: Three times a day (TID) | INTRAMUSCULAR | Status: DC | PRN

## 2015-07-21 MED ORDER — GLYCOPYRROLATE 0.2 MG/ML IJ SOLN
INTRAMUSCULAR | Status: DC | PRN
  Administered 2015-07-21: .7 mg via INTRAVENOUS

## 2015-07-21 MED ORDER — PROMETHAZINE HCL 25 MG/ML IJ SOLN: 6.2500 mg | INTRAMUSCULAR | Status: DC | PRN

## 2015-07-21 MED ORDER — ONDANSETRON HCL 4 MG/2ML IJ SOLN
INTRAMUSCULAR | Status: AC
  Filled 2015-07-21: qty 2

## 2015-07-21 MED ORDER — ALBUMIN HUMAN 5 % IV SOLN
INTRAVENOUS | Status: DC | PRN
  Administered 2015-07-21 (×2): via INTRAVENOUS

## 2015-07-21 MED ORDER — MIDAZOLAM HCL 5 MG/5ML IJ SOLN
INTRAMUSCULAR | Status: DC | PRN
  Administered 2015-07-21: 2 mg via INTRAVENOUS

## 2015-07-21 MED ORDER — FENTANYL CITRATE (PF) 250 MCG/5ML IJ SOLN
INTRAMUSCULAR | Status: AC
  Filled 2015-07-21: qty 5

## 2015-07-21 MED ORDER — METHOCARBAMOL 500 MG PO TABS
ORAL_TABLET | ORAL | Status: AC
  Filled 2015-07-21: qty 1

## 2015-07-21 MED ORDER — METOCLOPRAMIDE HCL 10 MG PO TABS: 5.0000 mg | ORAL_TABLET | Freq: Three times a day (TID) | ORAL | Status: DC | PRN

## 2015-07-21 MED ORDER — ASPIRIN EC 325 MG PO TBEC
325.0000 mg | DELAYED_RELEASE_TABLET | Freq: Two times a day (BID) | ORAL | Status: DC
  Administered 2015-07-21 – 2015-07-24 (×6): 325 mg via ORAL
  Filled 2015-07-21 (×6): qty 1

## 2015-07-21 MED ORDER — ROCURONIUM BROMIDE 100 MG/10ML IV SOLN
INTRAVENOUS | Status: DC | PRN
  Administered 2015-07-21: 20 mg via INTRAVENOUS
  Administered 2015-07-21: 50 mg via INTRAVENOUS
  Administered 2015-07-21: 10 mg via INTRAVENOUS

## 2015-07-21 MED ORDER — NEOSTIGMINE METHYLSULFATE 10 MG/10ML IV SOLN
INTRAVENOUS | Status: DC | PRN
  Administered 2015-07-21: 5 mg via INTRAVENOUS

## 2015-07-21 MED ORDER — ESMOLOL HCL 100 MG/10ML IV SOLN
INTRAVENOUS | Status: AC
  Filled 2015-07-21: qty 10

## 2015-07-21 MED ORDER — HYDROMORPHONE HCL 1 MG/ML IJ SOLN
1.0000 mg | INTRAMUSCULAR | Status: DC | PRN
  Administered 2015-07-22 – 2015-07-23 (×6): 1 mg via INTRAVENOUS
  Filled 2015-07-21 (×6): qty 1

## 2015-07-21 MED ORDER — PROPOFOL 10 MG/ML IV BOLUS
INTRAVENOUS | Status: DC | PRN
  Administered 2015-07-21 (×2): 100 mg via INTRAVENOUS
  Administered 2015-07-21: 50 mg via INTRAVENOUS
  Administered 2015-07-21: 200 mg via INTRAVENOUS

## 2015-07-21 MED ORDER — CHLORHEXIDINE GLUCONATE 4 % EX LIQD: 60.0000 mL | Freq: Once | CUTANEOUS | Status: DC

## 2015-07-21 MED ORDER — SUCCINYLCHOLINE 20MG/ML (10ML) SYRINGE FOR MEDFUSION PUMP - OPTIME
INTRAMUSCULAR | Status: DC | PRN
  Administered 2015-07-21: 150 mg via INTRAVENOUS
  Administered 2015-07-21: 50 mg via INTRAVENOUS

## 2015-07-21 MED ORDER — CEFAZOLIN SODIUM-DEXTROSE 2-3 GM-% IV SOLR
2.0000 g | Freq: Four times a day (QID) | INTRAVENOUS | Status: AC
  Administered 2015-07-21 (×2): 2 g via INTRAVENOUS
  Filled 2015-07-21 (×2): qty 50

## 2015-07-21 MED ORDER — PROPOFOL 10 MG/ML IV BOLUS
INTRAVENOUS | Status: AC
  Filled 2015-07-21: qty 40

## 2015-07-21 MED ORDER — METOPROLOL TARTRATE 25 MG PO TABS
25.0000 mg | ORAL_TABLET | Freq: Once | ORAL | Status: AC
  Administered 2015-07-21: 25 mg via ORAL
  Filled 2015-07-21: qty 1

## 2015-07-21 MED ORDER — NEOSTIGMINE METHYLSULFATE 10 MG/10ML IV SOLN
INTRAVENOUS | Status: AC
  Filled 2015-07-21: qty 1

## 2015-07-21 MED ORDER — MIDAZOLAM HCL 2 MG/2ML IJ SOLN
INTRAMUSCULAR | Status: AC
  Filled 2015-07-21: qty 2

## 2015-07-21 MED ORDER — OXYCODONE HCL 5 MG PO TABS
5.0000 mg | ORAL_TABLET | ORAL | Status: DC | PRN
  Administered 2015-07-21 – 2015-07-24 (×10): 15 mg via ORAL
  Filled 2015-07-21 (×10): qty 3

## 2015-07-21 MED ORDER — FENTANYL CITRATE (PF) 100 MCG/2ML IJ SOLN
INTRAMUSCULAR | Status: DC | PRN
  Administered 2015-07-21 (×2): 50 ug via INTRAVENOUS
  Administered 2015-07-21 (×2): 100 ug via INTRAVENOUS

## 2015-07-21 MED ORDER — TRIAMTERENE-HCTZ 75-50 MG PO TABS
1.0000 | ORAL_TABLET | Freq: Every day | ORAL | Status: DC
  Administered 2015-07-22 – 2015-07-24 (×3): 1 via ORAL
  Filled 2015-07-21 (×6): qty 1

## 2015-07-21 MED ORDER — SODIUM CHLORIDE 0.9 % IR SOLN
Status: DC | PRN
  Administered 2015-07-21: 3000 mL

## 2015-07-21 MED ORDER — PHENOL 1.4 % MT LIQD: 1.0000 | OROMUCOSAL | Status: DC | PRN

## 2015-07-21 MED ORDER — CANAGLIFLOZIN 100 MG PO TABS
100.0000 mg | ORAL_TABLET | Freq: Every day | ORAL | Status: DC
  Administered 2015-07-22 – 2015-07-24 (×3): 100 mg via ORAL
  Filled 2015-07-21 (×3): qty 1

## 2015-07-21 MED ORDER — SUCCINYLCHOLINE CHLORIDE 20 MG/ML IJ SOLN
INTRAMUSCULAR | Status: AC
  Filled 2015-07-21: qty 1

## 2015-07-21 MED ORDER — ACETAMINOPHEN 650 MG RE SUPP: 650.0000 mg | Freq: Four times a day (QID) | RECTAL | Status: DC | PRN

## 2015-07-21 MED ORDER — ONDANSETRON HCL 4 MG/2ML IJ SOLN
INTRAMUSCULAR | Status: DC | PRN
  Administered 2015-07-21: 4 mg via INTRAVENOUS

## 2015-07-21 MED ORDER — KETOROLAC TROMETHAMINE 15 MG/ML IJ SOLN
INTRAMUSCULAR | Status: AC
  Filled 2015-07-21: qty 1

## 2015-07-21 MED ORDER — OXYCODONE HCL 5 MG PO TABS
ORAL_TABLET | ORAL | Status: AC
  Filled 2015-07-21: qty 3

## 2015-07-21 MED ORDER — ONDANSETRON HCL 4 MG/2ML IJ SOLN: 4.0000 mg | Freq: Four times a day (QID) | INTRAMUSCULAR | Status: DC | PRN

## 2015-07-21 MED ORDER — HYDROMORPHONE HCL 1 MG/ML IJ SOLN
0.2500 mg | INTRAMUSCULAR | Status: DC | PRN
  Administered 2015-07-21 (×3): 0.5 mg via INTRAVENOUS

## 2015-07-21 MED ORDER — ONDANSETRON HCL 4 MG PO TABS: 4.0000 mg | ORAL_TABLET | Freq: Four times a day (QID) | ORAL | Status: DC | PRN

## 2015-07-21 MED ORDER — KETOROLAC TROMETHAMINE 15 MG/ML IJ SOLN
7.5000 mg | Freq: Four times a day (QID) | INTRAMUSCULAR | Status: AC
  Administered 2015-07-21 – 2015-07-22 (×3): 7.5 mg via INTRAVENOUS
  Filled 2015-07-21 (×3): qty 1

## 2015-07-21 MED ORDER — LACTATED RINGERS IV SOLN
INTRAVENOUS | Status: DC
  Administered 2015-07-21 (×2): via INTRAVENOUS

## 2015-07-21 MED ORDER — LIDOCAINE HCL (CARDIAC) 20 MG/ML IV SOLN
INTRAVENOUS | Status: DC | PRN
  Administered 2015-07-21: 80 mg via INTRAVENOUS

## 2015-07-21 MED ORDER — ROCURONIUM BROMIDE 50 MG/5ML IV SOLN
INTRAVENOUS | Status: AC
  Filled 2015-07-21: qty 1

## 2015-07-21 MED ORDER — DIPHENHYDRAMINE HCL 12.5 MG/5ML PO ELIX
12.5000 mg | ORAL_SOLUTION | ORAL | Status: DC | PRN
  Filled 2015-07-21: qty 10

## 2015-07-21 MED ORDER — DOCUSATE SODIUM 100 MG PO CAPS
100.0000 mg | ORAL_CAPSULE | Freq: Two times a day (BID) | ORAL | Status: DC
  Administered 2015-07-21 – 2015-07-24 (×6): 100 mg via ORAL
  Filled 2015-07-21 (×6): qty 1

## 2015-07-21 MED ORDER — PHENYLEPHRINE 40 MCG/ML (10ML) SYRINGE FOR IV PUSH (FOR BLOOD PRESSURE SUPPORT)
PREFILLED_SYRINGE | INTRAVENOUS | Status: AC
  Filled 2015-07-21: qty 10

## 2015-07-21 MED ORDER — MENTHOL 3 MG MT LOZG
1.0000 | LOZENGE | OROMUCOSAL | Status: DC | PRN
  Administered 2015-07-22: 3 mg via ORAL
  Filled 2015-07-21: qty 9

## 2015-07-21 MED ORDER — ACETAMINOPHEN 325 MG PO TABS
650.0000 mg | ORAL_TABLET | Freq: Four times a day (QID) | ORAL | Status: DC | PRN
  Administered 2015-07-22: 650 mg via ORAL
  Filled 2015-07-21: qty 2

## 2015-07-21 MED ORDER — ALUM & MAG HYDROXIDE-SIMETH 200-200-20 MG/5ML PO SUSP: 30.0000 mL | ORAL | Status: DC | PRN

## 2015-07-21 MED ORDER — HYDROMORPHONE HCL 1 MG/ML IJ SOLN
INTRAMUSCULAR | Status: DC | PRN
  Administered 2015-07-21 (×2): 1 mg via INTRAVENOUS

## 2015-07-21 SURGICAL SUPPLY — 57 items
APL SKNCLS STERI-STRIP NONHPOA (GAUZE/BANDAGES/DRESSINGS) ×1
BENZOIN TINCTURE PRP APPL 2/3 (GAUZE/BANDAGES/DRESSINGS) ×3 IMPLANT
BLADE SAW SGTL 18X1.27X75 (BLADE) ×2 IMPLANT
BLADE SAW SGTL 18X1.27X75MM (BLADE) ×1
BLADE SURG ROTATE 9660 (MISCELLANEOUS) IMPLANT
CAPT HIP TOTAL 2 ×2 IMPLANT
CELLS DAT CNTRL 66122 CELL SVR (MISCELLANEOUS) ×1 IMPLANT
CLOSURE WOUND 1/2 X4 (GAUZE/BANDAGES/DRESSINGS) ×1
COVER SURGICAL LIGHT HANDLE (MISCELLANEOUS) ×3 IMPLANT
DRAPE C-ARM 42X72 X-RAY (DRAPES) ×3 IMPLANT
DRAPE STERI IOBAN 125X83 (DRAPES) ×3 IMPLANT
DRAPE U-SHAPE 47X51 STRL (DRAPES) ×9 IMPLANT
DRSG AQUACEL AG ADV 3.5X10 (GAUZE/BANDAGES/DRESSINGS) ×3 IMPLANT
DURAPREP 26ML APPLICATOR (WOUND CARE) ×3 IMPLANT
ELECT BLADE 4.0 EZ CLEAN MEGAD (MISCELLANEOUS) ×3
ELECT BLADE 6.5 EXT (BLADE) ×2 IMPLANT
ELECT REM PT RETURN 9FT ADLT (ELECTROSURGICAL) ×3
ELECTRODE BLDE 4.0 EZ CLN MEGD (MISCELLANEOUS) ×1 IMPLANT
ELECTRODE REM PT RTRN 9FT ADLT (ELECTROSURGICAL) ×1 IMPLANT
FACESHIELD WRAPAROUND (MASK) ×9 IMPLANT
FACESHIELD WRAPAROUND OR TEAM (MASK) ×2 IMPLANT
GAUZE XEROFORM 1X8 LF (GAUZE/BANDAGES/DRESSINGS) ×2 IMPLANT
GLOVE BIOGEL PI IND STRL 8 (GLOVE) ×2 IMPLANT
GLOVE BIOGEL PI INDICATOR 8 (GLOVE) ×4
GLOVE ECLIPSE 8.0 STRL XLNG CF (GLOVE) ×3 IMPLANT
GLOVE ORTHO TXT STRL SZ7.5 (GLOVE) ×6 IMPLANT
GOWN STRL REUS W/ TWL LRG LVL3 (GOWN DISPOSABLE) ×2 IMPLANT
GOWN STRL REUS W/ TWL XL LVL3 (GOWN DISPOSABLE) ×2 IMPLANT
GOWN STRL REUS W/TWL LRG LVL3 (GOWN DISPOSABLE) ×6
GOWN STRL REUS W/TWL XL LVL3 (GOWN DISPOSABLE) ×6
HANDPIECE INTERPULSE COAX TIP (DISPOSABLE) ×3
KIT BASIN OR (CUSTOM PROCEDURE TRAY) ×3 IMPLANT
KIT ROOM TURNOVER OR (KITS) ×3 IMPLANT
MANIFOLD NEPTUNE II (INSTRUMENTS) ×3 IMPLANT
NS IRRIG 1000ML POUR BTL (IV SOLUTION) ×3 IMPLANT
PACK TOTAL JOINT (CUSTOM PROCEDURE TRAY) ×3 IMPLANT
PAD ARMBOARD 7.5X6 YLW CONV (MISCELLANEOUS) ×5 IMPLANT
RETRACTOR WND ALEXIS 18 MED (MISCELLANEOUS) ×1 IMPLANT
RTRCTR WOUND ALEXIS 18CM MED (MISCELLANEOUS) ×3
SET HNDPC FAN SPRY TIP SCT (DISPOSABLE) ×1 IMPLANT
STAPLER VISISTAT 35W (STAPLE) ×2 IMPLANT
STRIP CLOSURE SKIN 1/2X4 (GAUZE/BANDAGES/DRESSINGS) ×3 IMPLANT
SUT ETHIBOND NAB CT1 #1 30IN (SUTURE) ×3 IMPLANT
SUT MNCRL AB 4-0 PS2 18 (SUTURE) IMPLANT
SUT VIC AB 0 CT1 27 (SUTURE) ×6
SUT VIC AB 0 CT1 27XBRD ANBCTR (SUTURE) ×1 IMPLANT
SUT VIC AB 1 CT1 27 (SUTURE) ×3
SUT VIC AB 1 CT1 27XBRD ANBCTR (SUTURE) ×1 IMPLANT
SUT VIC AB 2-0 CT1 27 (SUTURE) ×6
SUT VIC AB 2-0 CT1 TAPERPNT 27 (SUTURE) ×1 IMPLANT
TOWEL OR 17X24 6PK STRL BLUE (TOWEL DISPOSABLE) ×3 IMPLANT
TOWEL OR 17X26 10 PK STRL BLUE (TOWEL DISPOSABLE) ×3 IMPLANT
TRAY FOLEY CATH 16FRSI W/METER (SET/KITS/TRAYS/PACK) IMPLANT
TUBE CONNECTING 12'X1/4 (SUCTIONS) ×1
TUBE CONNECTING 12X1/4 (SUCTIONS) ×1 IMPLANT
WATER STERILE IRR 1000ML POUR (IV SOLUTION) ×2 IMPLANT
YANKAUER SUCT BULB TIP NO VENT (SUCTIONS) ×2 IMPLANT

## 2015-07-21 NOTE — Brief Op Note (Signed)
07/21/2015  10:44 AM  PATIENT:  Diamond D Russon  40 y.o. male  PRE-OPERATIVE DIAGNOSIS:  Severe osteoarthritis left hip  POST-OPERATIVE DIAGNOSIS:  Severe osteoarthritis left hip  PROCEDURE:  Procedure(s): LEFT TOTAL HIP ARTHROPLASTY ANTERIOR APPROACH (Left)  SURGEON:  Surgeon(s) and Role:    * Kathryne Hitchhristopher Y Jaizon Deroos, MD - Primary  PHYSICIAN ASSISTANT: Rexene EdisonGil clark, PA-C  ANESTHESIA:   general  EBL:  Total I/O In: 1800 [I.V.:1300; IV Piggyback:500] Out: 800 [Blood:800]  COUNTS:  YES  TOURNIQUET:  * No tourniquets in log *  DICTATION: .Other Dictation: Dictation Number [id number]  PLAN OF CARE: Admit to inpatient   PATIENT DISPOSITION:  PACU - hemodynamically stable.   Delay start of Pharmacological VTE agent (>24hrs) due to surgical blood loss or risk of bleeding: no

## 2015-07-21 NOTE — Op Note (Signed)
NAME:  [name], [name]               ACCOUNT NO.:  [id number]  MEDICAL RECORD NO.:  [id number]  LOCATION:                               FACILITY:  [facility]  PHYSICIAN:  Mitsy Owen Y. 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  PROCEDURE:  Left total hip arthroplasty through direct anterior approach.  IMPLANTS:  DePuy Sector Gription acetabular component size 60 with a single screw, size 36+ 4 polyethylene liner, size 14 Corail femoral component with varus offset, size 36+ 5 ceramic hip ball.  SURGEON:  Rigby Swamy Y. Jamal Haskin, M.D.  ASSISTANT:  Gilbert Clark, PA-C.  ANESTHESIA:  General.  ANTIBIOTICS:  3 g of IV Ancef.  ESTIMATED BLOOD LOSS:  800 mL.  COMPLICATIONS:  None.  INDICATIONS:  [name] is only 40-year-old gentleman, who is morbidly obese and has debilitating arthritis involving both of his hips.  His right hip show significant bone-on-bone wear, but the left hip is much worse than his protrusio with significant change in his architecture of his hip.  Basically, I can even see a hip ball on that x-ray.  He is morbidly obese individual, he talked about direct anterior hip surgery; and on examination, I am able to easily see the soft tissue plane, I will need to take to get to his hip.  I talked about the risks given his obesity surgery and these were all heightened including the risk of acute blood loss anemia, nerve and vessel injury, fracture, infection, wound complications, DVT and dislocation.  He understands our goals are decreased pain, improved mobility, and overall improved quality of life.  PROCEDURE DESCRIPTION:  After informed consent  was obtained, appropriate left hip was marked.  He was brought to the operating room.  General anesthesia was obtained while he was on a stretcher.  Traction boots were placed on both his feet.  Next, he was placed supine on the Hana fracture table.  The perineal post in place and both legs in inline skeletal traction devices, no traction applied.  We did have to mobilize and tape his abdomen out of the way.  We then prepped his left hip area with DuraPrep and sterile drapes.  A time-out was called and he was identified as correct patient, correct left hip.  We then made incision inferior and posterior to the anterior superior iliac spine and carried this leg obliquely down the leg.  We dissected down the tensor fascia lata muscle.  Tensor fascia was then divided longitudinally, so we could proceed with a direct anterior approach to the hip.  We identified and cauterized the lateral femoral circumflex vessels and then identified the hip capsule.  I opened up the hip capsule, basically had to remove all the hip capsule, because of his severe protrusio.  We then were able to identify the femoral head and made our femoral neck cut with an oscillating saw proximal to the lesser trochanter and completed this on osteotome.    We placed a corkscrew guide in the femoral head and removed femoral head in its entirety and it was completely devoid of cartilage. We then began reaming from a size 42 reamer and reamed all the way up and we had to go all the way up to a size 60 to get a good rim fit due to his severe deformity and protrusio.  The last 2 reamers were placed under direct fluoroscopy as well, so we could obtain our depth of reaming, our inclination, and anteversion.  Once we were pleased with this, we placed the real DePuy Sector Gription acetabular component size 60 and a single screw.  We then placed a 36+ 4 polyethylene liner. Attention was then turned to the femur with the leg externally  rotated to 100 degrees extended and adducted and we were able to place a Mueller retractor medially and Hohmann retractor behind the greater trochanter. We released the lateral joint capsule in the piriformis and brought the leg up a little higher.  We then used a box cutting osteotome to enter femoral canal and a rongeur lateralize.  We then began broaching from a size 8 broach all the way up to a size 14.  With a size 14, we trialed a varus offset femoral neck to give him more varus even though we figured he would be tight, because he has been in a protrusio position for so long.  We trialed a varus offset femoral neck and a 36+ 1.5 hip ball rolled leg back over and up with traction and internal rotation reducing the pelvis and it was stable, but I felt like we go a little bit more leg length wise with a +5 ball.  We dislocated the hip and removed the trial components.  We then placed the real size 14 Corail femoral component with varus offset and the real 36+ 5 ceramic hip ball reduced this in the pelvis and we were pleased with stability, leg length and offset.  We then irrigated the hip with normal saline solution.  There was no joint capsule to close.  The tensor fascia lata with interrupted #1 Vicryl suture followed by 0 Vicryl deep tissue, 2-0 Vicryl subcutaneous tissue, and interrupted staples on the skin.  An Aquacel dressing was applied.  He was then taken off the Hana table, awakened, extubated, and taken to the recovery room in stable condition.  All final counts were correct.  There were no complications noted.  Of note, Gilbert Clark, PA-C assisted in the entire case.  His assistance was crucial for facilitating all aspects of this case.     [name], M.D.     CYB/MEDQ  D:  07/21/2015  T:  07/21/2015  Job:  [id number] 

## 2015-07-21 NOTE — Anesthesia Postprocedure Evaluation (Signed)
Anesthesia Post Note  Patient: [name]  Procedure(s) Performed: Procedure(s) (LRB): LEFT TOTAL HIP ARTHROPLASTY ANTERIOR APPROACH (Left)  Patient location during evaluation: PACU Anesthesia Type: General Level of consciousness: awake and alert Pain management: pain level controlled Vital Signs Assessment: post-procedure vital signs reviewed and stable Respiratory status: spontaneous breathing, nonlabored ventilation, respiratory function stable and patient connected to nasal cannula oxygen Cardiovascular status: blood pressure returned to baseline and stable Postop Assessment: no signs of nausea or vomiting Anesthetic complications: no    Last Vitals:  Filed Vitals:   07/21/15 1122 07/21/15 1130  BP: 126/74   Pulse: 125 122  Temp:    Resp: 22 16    Last Pain:  Filed Vitals:   07/21/15 1235  PainSc: 7                  [name] S

## 2015-07-21 NOTE — H&P (Signed)
TOTAL HIP ADMISSION H&P  Patient is admitted for left total hip arthroplasty.  Subjective:  Chief Complaint: left hip pain  HPI: [name], 40 y.o. male, has a history of pain and functional disability in the left hip(s) due to arthritis and patient has failed non-surgical conservative treatments for greater than 12 weeks to include NSAID's and/or analgesics, corticosteriod injections, flexibility and strengthening excercises, use of assistive devices, weight reduction as appropriate and activity modification.  Onset of symptoms was gradual starting 3 years ago with gradually worsening course since that time.The patient noted no past surgery on the left hip(s).  Patient currently rates pain in the left hip at 10 out of 10 with activity. Patient has night pain, worsening of pain with activity and weight bearing, trendelenberg gait, pain that interfers with activities of daily living, pain with passive range of motion and crepitus. Patient has evidence of subchondral cysts, subchondral sclerosis, periarticular osteophytes and joint space narrowing by imaging studies. This condition presents safety issues increasing the risk of falls.  There is no current active infection.  Patient Active Problem List   Diagnosis Date Noted  . Osteoarthritis of left hip 07/21/2015  . Lumbar stenosis with neurogenic claudication 01/14/2014  . Spinal stenosis, lumbar region, with neurogenic claudication 07/30/2012   Past Medical History  Diagnosis Date  . Hypertension   . Diabetes mellitus without complication (HCC)   . Arthritis   . Anemia   . Anginal pain (HCC)   . Sleep apnea     wore CPAP prior to tonsillectomy  . Asthma     as a child  . H/O transfusion of packed red blood cells     with prior lumbar fusions x2    Past Surgical History  Procedure Laterality Date  . Tonsillectomy  2005     for sleep apnea  . Wisdom tooth extraction    . Back surgery      lumbar fusion    No prescriptions  prior to admission   Allergies  Allergen Reactions  . Bee Venom Anaphylaxis, Swelling and Other (See Comments)    Swelling primarily at sting site, but can be all over.  Epi pen prn    Social History  Substance Use Topics  . Smoking status: Former Smoker -- 0.50 packs/day for 20 years    Types: Cigarettes    Quit date: 07/09/2012  . Smokeless tobacco: Not on file  . Alcohol Use: No    No family history on file.   Review of Systems  Musculoskeletal: Positive for joint pain.  All other systems reviewed and are negative.   Objective:  Physical Exam  Constitutional: He is oriented to person, place, and time. He appears well-developed and well-nourished.  HENT:  Head: Normocephalic and atraumatic.  Eyes: EOM are normal. Pupils are equal, round, and reactive to light.  Neck: Normal range of motion. Neck supple.  Cardiovascular: Normal rate and regular rhythm.   Respiratory: Effort normal and breath sounds normal.  GI: Soft. Bowel sounds are normal.  Musculoskeletal:       Left hip: He exhibits decreased range of motion, decreased strength, tenderness and bony tenderness.  Neurological: He is alert and oriented to person, place, and time.  Skin: Skin is warm and dry.  Psychiatric: He has a normal mood and affect.    Vital signs in last 24 hours:    Labs:   Estimated body mass index is 45.17 kg/(m^2) as calculated from the following:   Height as  of 01/30/14:  (1.88 m).   Weight as of 01/30/14: 159.666 kg (352 lb).   Imaging Review Plain radiographs demonstrate severe degenerative joint disease of the left hip(s). The bone quality appears to be good for age and reported activity level.  Assessment/Plan:  End stage arthritis, left hip(s)  The patient history, physical examination, clinical judgement of the provider and imaging studies are consistent with end stage degenerative joint disease of the left hip(s) and total hip arthroplasty is deemed medically necessary.  The treatment options including medical management, injection therapy, arthroscopy and arthroplasty were discussed at length. The risks and benefits of total hip arthroplasty were presented and reviewed. The risks due to aseptic loosening, infection, stiffness, dislocation/subluxation,  thromboembolic complications and other imponderables were discussed.  The patient acknowledged the explanation, agreed to proceed with the plan and consent was signed. Patient is being admitted for inpatient treatment for surgery, pain control, PT, OT, prophylactic antibiotics, VTE prophylaxis, progressive ambulation and ADL's and discharge planning.The patient is planning to be discharged home with home health services

## 2015-07-21 NOTE — Anesthesia Procedure Notes (Signed)
Procedure Name: Intubation Date/Time: 07/21/2015 9:09 AM Performed by: Wilder GladeWINN, Odean Mcelwain G Pre-anesthesia Checklist: Patient identified, Emergency Drugs available, Suction available, Patient being monitored and Timeout performed Patient Re-evaluated:Patient Re-evaluated prior to inductionOxygen Delivery Method: Circle system utilized Preoxygenation: Pre-oxygenation with 100% oxygen Intubation Type: IV induction and Cricoid Pressure applied Ventilation: Mask ventilation without difficulty and Oral airway inserted - appropriate to patient size Laryngoscope Size: 4, Glidescope and Mac Grade View: Grade II Tube type: Oral Tube size: 8.0 mm Number of attempts: 1 Intubation method: positioned in sniffing position. Secured at: 24 cm Tube secured with: Tape Dental Injury: Teeth and Oropharynx as per pre-operative assessment  Difficulty Due To: Difficulty was anticipated and Difficult Airway- due to large tongue Future Recommendations: Recommend- induction with short-acting agent, and alternative techniques readily available

## 2015-07-21 NOTE — Op Note (Deleted)
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  PROCEDURE:  Left total hip arthroplasty through direct anterior approach.  IMPLANTS:  DePuy Sector Gription acetabular component size 60 with a single screw, size 36+ 4 polyethylene liner, size 14 Corail femoral component with varus offset, size 36+ 5 ceramic hip ball.  SURGEON:  [name], M.D.  ASSISTANT:  Nicholas Canal, PA-C.  ANESTHESIA:  General.  ANTIBIOTICS:  3 g of IV Ancef.  ESTIMATED BLOOD LOSS:  800 mL.  COMPLICATIONS:  None.  INDICATIONS:  [name] is only 40 year old gentleman, who is morbidly obese and has debilitating arthritis involving both of his hips.  His right hip show significant bone-on-bone wear, but the left hip is much worse than his protrusio with significant change in his architecture of his hip.  Basically, I can even see a hip ball on that x-ray.  He is morbidly obese individual, he talked about direct anterior hip surgery; and on examination, I am able to easily see the soft tissue plane, I will need to take to get to his hip.  I talked about the risks given his obesity surgery and these were all heightened including the risk of acute blood loss anemia, nerve and vessel injury, fracture, infection, wound complications, DVT and dislocation.  He understands our goals are decreased pain, improved mobility, and overall improved quality of life.  PROCEDURE DESCRIPTION:  After informed consent  was obtained, appropriate left hip was marked.  He was brought to the operating room.  General anesthesia was obtained while he was on a stretcher.  Traction boots were placed on both his feet.  Next, he was placed supine on the Hana fracture table.  The perineal post in place and both legs in inline skeletal traction devices, no traction applied.  We did have to mobilize and tape his abdomen out of the way.  We then prepped his left hip area with DuraPrep and sterile drapes.  A time-out was called and he was identified as correct patient, correct left hip.  We then made incision inferior and posterior to the anterior superior iliac spine and carried this leg obliquely down the leg.  We dissected down the tensor fascia lata muscle.  Tensor fascia was then divided longitudinally, so we could proceed with a direct anterior approach to the hip.  We identified and cauterized the lateral femoral circumflex vessels and then identified the hip capsule.  I opened up the hip capsule, basically had to remove all the hip capsule, because of his severe protrusio.  We then were able to identify the femoral head and made our femoral neck cut with an oscillating saw proximal to the lesser trochanter and completed this on osteotome.  We placed a corkscrew guide in the femoral head and removed femoral head in its entirety and it was completely devoid of cartilage. We then began reaming from a size 42 reamer and reamed all the way up and we had to go all the way up to a size 60 to get a good rim fit due to his severe deformity and protrusio.  The last 2 reamers were placed under direct fluoroscopy as well, so we could obtain our depth of reaming, our inclination, and anteversion.  Once we were pleased with this, we placed the real DePuy Sector Gription acetabular component size 60 and a single screw.  We then placed a 36+ 4 polyethylene liner. Attention was then turned to the femur with the leg externally  rotated to 100 degrees extended and adducted and we were able to place a Mueller retractor medially and Hohmann retractor behind the greater trochanter. We released the lateral joint capsule in the piriformis and brought the leg up a little higher.  We then used a box cutting osteotome to enter femoral [name] and a rongeur lateralize.  We then began broaching from a size 8 broach all the way up to a size 14.  With a size 14, we trialed a varus offset femoral neck to give him more varus even though we figured he would be tight, because he has been in a protrusio position for so long.  We trialed a varus offset femoral neck and a 36+ 1.5 hip ball rolled leg back over and up with traction and internal rotation reducing the pelvis and it was stable, but I felt like we go a little bit more leg length wise with a +5 ball.  We dislocated the hip and removed the trial components.  We then placed the real size 14 Corail femoral component with varus offset and the real 36+ 5 ceramic hip ball reduced this in the pelvis and we were pleased with stability, leg length and offset.  We then irrigated the hip with normal saline solution.  There was no joint capsule to close.  The tensor fascia lata with interrupted #1 Vicryl suture followed by 0 Vicryl deep tissue, 2-0 Vicryl subcutaneous tissue, and interrupted staples on the skin.  An Aquacel dressing was applied.  He was then taken off the Hana table, awakened, extubated, and taken to the recovery room in stable condition.  All final counts were correct.  There were no complications noted.  Of note, Nicholas CanalGilbert Clark, PA-C assisted in the entire case.  His assistance was crucial for facilitating all aspects of this case.     Nicholas Pandahristopher Y. Magnus [name], M.D.     CYB/MEDQ  D:  07/21/2015  T:  07/21/2015  Job:  [id number]

## 2015-07-21 NOTE — Anesthesia Preprocedure Evaluation (Signed)
Anesthesia Evaluation  Patient identified by MRN, date of birth, ID band Patient awake    Reviewed: Allergy & Precautions, NPO status , Patient's Chart, lab work & pertinent test results  Airway Mallampati: II  TM Distance: >3 FB Neck ROM: Full    Dental no notable dental hx.    Pulmonary sleep apnea , former smoker,    Pulmonary exam normal breath sounds clear to auscultation       Cardiovascular hypertension, Normal cardiovascular exam Rhythm:Regular Rate:Normal     Neuro/Psych negative neurological ROS  negative psych ROS   GI/Hepatic negative GI ROS, Neg liver ROS,   Endo/Other  diabetesMorbid obesity  Renal/GU negative Renal ROS  negative genitourinary   Musculoskeletal negative musculoskeletal ROS (+)   Abdominal   Peds negative pediatric ROS (+)  Hematology negative hematology ROS (+)   Anesthesia Other Findings   Reproductive/Obstetrics negative OB ROS                             Anesthesia Physical Anesthesia Plan  ASA: III  Anesthesia Plan: General   Post-op Pain Management:    Induction: Intravenous  Airway Management Planned: Oral ETT  Additional Equipment:   Intra-op Plan:   Post-operative Plan: Extubation in OR  Informed Consent: I have reviewed the patients History and Physical, chart, labs and discussed the procedure including the risks, benefits and alternatives for the proposed anesthesia with the patient or authorized representative who has indicated his/her understanding and acceptance.   Dental advisory given  Plan Discussed with: CRNA and Surgeon  Anesthesia Plan Comments:         Anesthesia Quick Evaluation

## 2015-07-21 NOTE — Transfer of Care (Signed)
Immediate Anesthesia Transfer of Care Note  Patient: [name]  Procedure(s) Performed: Procedure(s): LEFT TOTAL HIP ARTHROPLASTY ANTERIOR APPROACH (Left)  Patient Location: PACU  Anesthesia Type:General  Level of Consciousness: awake, alert , oriented and patient cooperative  Airway & Oxygen Therapy: Patient Spontanous Breathing and Patient connected to face mask oxygen  Post-op Assessment: Report given to RN and Post -op Vital signs reviewed and stable  Post vital signs: Reviewed and stable  Last Vitals:  Filed Vitals:   07/21/15 0809  BP: 124/82  Pulse: 122  Temp: 37.2 C  Resp: 20    Complications: No apparent anesthesia complications

## 2015-07-22 ENCOUNTER — Encounter (HOSPITAL_COMMUNITY): Payer: Self-pay | Admitting: Orthopaedic Surgery

## 2015-07-22 LAB — CBC
HCT: 31 % — ABNORMAL LOW (ref 39.0–52.0)
Hemoglobin: 10.2 g/dL — ABNORMAL LOW (ref 13.0–17.0)
MCH: 30.4 pg (ref 26.0–34.0)
MCHC: 32.9 g/dL (ref 30.0–36.0)
MCV: 92.5 fL (ref 78.0–100.0)
PLATELETS: 304 10*3/uL (ref 150–400)
RBC: 3.35 MIL/uL — ABNORMAL LOW (ref 4.22–5.81)
RDW: 13.4 % (ref 11.5–15.5)
WBC: 10.6 10*3/uL — ABNORMAL HIGH (ref 4.0–10.5)

## 2015-07-22 LAB — BASIC METABOLIC PANEL
Anion gap: 12 (ref 5–15)
BUN: 18 mg/dL (ref 6–20)
CALCIUM: 8 mg/dL — AB (ref 8.9–10.3)
CO2: 26 mmol/L (ref 22–32)
Chloride: 99 mmol/L — ABNORMAL LOW (ref 101–111)
Creatinine, Ser: 1.01 mg/dL (ref 0.61–1.24)
GFR calc Af Amer: >60 mL/min (ref >60)
GLUCOSE: 170 mg/dL — AB (ref 65–99)
POTASSIUM: 3.6 mmol/L (ref 3.5–5.1)
Sodium: 137 mmol/L (ref 135–145)

## 2015-07-22 LAB — GLUCOSE, CAPILLARY
Glucose-Capillary: 251 mg/dL — ABNORMAL HIGH (ref 65–99)
Glucose-Capillary: 207 mg/dL — ABNORMAL HIGH (ref 65–99)
Glucose-Capillary: 145 mg/dL — ABNORMAL HIGH (ref 65–99)

## 2015-07-22 MED ORDER — SODIUM CHLORIDE 0.9 % IV BOLUS (SEPSIS)
500.0000 mL | Freq: Once | INTRAVENOUS | Status: AC
  Administered 2015-07-22: 500 mL via INTRAVENOUS

## 2015-07-22 MED ORDER — INSULIN ASPART 100 UNIT/ML ~~LOC~~ SOLN
0.0000 [IU] | Freq: Every day | SUBCUTANEOUS | Status: DC
  Administered 2015-07-23: 2 [IU] via SUBCUTANEOUS

## 2015-07-22 MED ORDER — INSULIN ASPART 100 UNIT/ML ~~LOC~~ SOLN
0.0000 [IU] | Freq: Three times a day (TID) | SUBCUTANEOUS | Status: DC
  Administered 2015-07-22: 7 [IU] via SUBCUTANEOUS
  Administered 2015-07-23 (×2): 4 [IU] via SUBCUTANEOUS
  Administered 2015-07-23: 3 [IU] via SUBCUTANEOUS
  Administered 2015-07-24 (×2): 4 [IU] via SUBCUTANEOUS

## 2015-07-22 NOTE — Progress Notes (Signed)
Dr Magnus IvanBlackman notified r/t BS and HR 130's d/c telemetry and see new orders

## 2015-07-22 NOTE — Evaluation (Signed)
Physical Therapy Evaluation Patient Details Name: [name] MRN: [id number] DOB: [birth year] Today's Date: 07/22/2015   History of Present Illness  40 yo male s/p L THA (anterior approach). PMH includes: HTN, DM, OSA, anemia, asthma (as a child). Former smoker. BMI 39. S/p lumbar fusion 01/14/14 and 07/30/12.   Clinical Impression  Pt is very unable to assist with standing up unless 2 person assist given, and with his prior spinal condition and current weakness with obesity, will need to have inpt therapy to recover his independent mobiltiy.  Pt has significant pain control issues and will anticipate inpt follow up with CIR to increase his standing independence and tolerance and to promote strength and ROM to L hip after surgery.  Pt is motivated and willing to work for safe mobility for home.    Follow Up Recommendations CIR (due to pt's PLOF)    Equipment Recommendations  None recommended by PT (await disposition of rehab team)    Recommendations for Other Services Rehab consult     Precautions / Restrictions Precautions Precautions: Fall Precaution Comments: Hip (anterior approach handout given) Restrictions Weight Bearing Restrictions: Yes LLE Weight Bearing: Weight bearing as tolerated      Mobility  Bed Mobility Overal bed mobility: + 2 for safety/equipment Bed Mobility: Supine to Sit     Supine to sit: Mod assist;+2 for physical assistance     General bed mobility comments: assist for trunk and LE's with pt's height an issue  Transfers Overall transfer level: Needs assistance Equipment used: Rolling walker (2 wheeled);Straight cane;2 person hand held assist Transfers: Sit to/from [employer] Sit to Stand: Max assist;+2 physical assistance;+2 safety/equipment Stand pivot transfers: Mod assist;+2 physical assistance;+2 safety/equipment (initiated with SPC then transitioned to walker)       General transfer comment: SPC and RW for sit to stand, RW  for stand pivot transfer.   Ambulation/Gait             General Gait Details: Transfer to chair with minimal stepping  Stairs            Wheelchair Mobility    Modified Rankin (Stroke Patients Only)       Balance Overall balance assessment: Needs assistance Sitting-balance support: Feet supported Sitting balance-Leahy Scale: Good   Postural control: Posterior lean Standing balance support: Bilateral upper extremity supported Standing balance-Leahy Scale: Fair                               Pertinent Vitals/Pain Pain Assessment: 0-10 Pain Score: 6  Pain Location: L hip Pain Descriptors / Indicators: Aching;Operative site guarding Pain Intervention(s): Monitored during session;Premedicated before session;Repositioned    Home Living Family/patient expects to be discharged to:: Private residence Living Arrangements: Spouse/significant other Available Help at Discharge: Family;Available PRN/intermittently Type of Home: Apartment Home Access: Level entry     Home Layout: One level Home Equipment: Bedside commode;Walker - 4 wheels;Cane - single point;Tub bench      Prior Function Level of Independence: Independent with assistive device(s)         Comments: has SPC for mobility     Hand Dominance        Extremity/Trunk Assessment   Upper Extremity Assessment: Overall WFL for tasks assessed           Lower Extremity Assessment: LLE deficits/detail   LLE Deficits / Details: Has pain with decreased mobiltiy on LLE and is not tolerating hip flexion or  WBing on LLE well  Cervical / Trunk Assessment: Normal  Communication   Communication: No difficulties  Cognition Arousal/Alertness: Awake/alert Behavior During Therapy: WFL for tasks assessed/performed Overall Cognitive Status: Within Functional Limits for tasks assessed                      General Comments General comments (skin integrity, edema, etc.): Tolerating a  flimited amount of pressure on LLE due to surgery but can use heavy framed cane to stand and control LLE discomfort.    Exercises        Assessment/Plan    PT Assessment Patient needs continued PT services  PT Diagnosis Acute pain;Difficulty walking   PT Problem List Decreased strength;Decreased range of motion;Decreased activity tolerance;Decreased balance;Decreased mobility;Decreased coordination;Decreased knowledge of use of DME;Decreased safety awareness;Decreased knowledge of precautions;Obesity;Decreased skin integrity;Pain  PT Treatment Interventions DME instruction;Gait training;Functional mobility training;Therapeutic activities;Therapeutic exercise;Balance training;Neuromuscular re-education;Patient/family education   PT Goals (Current goals can be found in the Care Plan section) Acute Rehab PT Goals Patient Stated Goal: to feel better and walk PT Goal Formulation: With patient/family Time For Goal Achievement: 08/05/15 Potential to Achieve Goals: Good    Frequency 7X/week   Barriers to discharge Inaccessible home environment;Decreased caregiver support has need for 2 person assist    Co-evaluation PT/OT/SLP Co-Evaluation/Treatment: Yes Reason for Co-Treatment: Complexity of the patient's impairments (multi-system involvement);For patient/therapist safety PT goals addressed during session: Mobility/safety with mobility;Balance;Proper use of DME         End of Session Equipment Utilized During Treatment: Gait belt Activity Tolerance: Patient limited by fatigue;Patient limited by pain Patient left: in chair;with call bell/phone within reach;with nursing/sitter in room;with family/visitor present Nurse Communication: Mobility status;Weight bearing status         Time: 1459-1535 PT Time Calculation (min) (ACUTE ONLY): 36 min   Charges:   PT Evaluation $PT Eval Moderate Complexity: 1 Procedure PT Treatments $Therapeutic Activity: 8-22 mins   PT G Codes:         Ivar DrapeStout, Flara Storti E 07/22/2015, 8:21 PM   Samul Dadauth Lean Jaeger, PT MS Acute Rehab Dept. Number: [facility] [id number] and [facility] [phone]

## 2015-07-22 NOTE — Progress Notes (Signed)
Subjective: 1 Day Post-Op Procedure(s) (LRB): LEFT TOTAL HIP ARTHROPLASTY ANTERIOR APPROACH (Left) Patient reports pain as moderate.  Tachycardic but stable vitals.  Objective: Vital signs in last 24 hours: Temp:  [97.6 F (36.4 C)-99.6 F (37.6 C)] 99.6 F (37.6 C) (03/15 0638) Pulse Rate:  [111-132] 132 (03/15 0638) Resp:  [13-23] 17 (03/15 16100638) BP: (103-134)/(62-88) 103/62 mmHg (03/15 0638) SpO2:  [91 %-99 %] 97 % (03/15 0638) Weight:  [137.44 kg (303 lb)-155.584 kg (343 lb)] 155.584 kg (343 lb) (03/14 1648)  Intake/Output from previous day: 03/14 0701 - 03/15 0700 In: 1868.9 [I.V.:1368.9; IV Piggyback:500] Out: 1875 [Urine:1075; Blood:800] Intake/Output this shift:     Recent Labs  07/22/15 0549  HGB 10.2*    Recent Labs  07/22/15 0549  WBC 10.6*  RBC 3.35*  HCT 31.0*  PLT 304   No results for input(s): NA, K, CL, CO2, BUN, CREATININE, GLUCOSE, CALCIUM in the last 72 hours. No results for input(s): LABPT, INR in the last 72 hours.  Sensation intact distally Intact pulses distally Dorsiflexion/Plantar flexion intact Incision: scant drainage  Assessment/Plan: 1 Day Post-Op Procedure(s) (LRB): LEFT TOTAL HIP ARTHROPLASTY ANTERIOR APPROACH (Left) Up with therapy  Arick Mareno Y 07/22/2015, 7:30 AM

## 2015-07-22 NOTE — Evaluation (Signed)
Occupational Therapy Evaluation Patient Details Name: Nicholas RearDarius D Dooling MRN: [id number] DOB: 05/18/1975 Today's Date: 07/22/2015    History of Present Illness 40 yo male s/p L THA (anterior approach). PMH includes: HTN, DM, OSA, anemia, asthma (as a child). Former smoker. BMI 39. S/p lumbar fusion 01/14/14 and 07/30/12.    Clinical Impression   Patient presenting with decreased ADL and functional mobility independence secondary to above. Patient independent PTA. Patient currently functioning at an overall min to max assist level, requiring +2 assist for functional transfers. Patient will benefit from acute OT to increase overall independence in the areas of ADLs, functional mobility, and overall safety in order to safely discharge to venue listed below. Pt will benefit from skilled, comprehensive, 3 hours of therapy per day to optimize independence and go home where he will be alone most of the day.    Follow Up Recommendations  CIR;Supervision/Assistance - 24 hour    Equipment Recommendations  Other (comment) (TBD)    Recommendations for Other Services  None at this time    Precautions / Restrictions Precautions Precautions: Fall Restrictions Weight Bearing Restrictions: Yes LLE Weight Bearing: Weight bearing as tolerated    Mobility Bed Mobility Overal bed mobility: Needs Assistance;+2 for physical assistance;+ 2 for safety/equipment Bed Mobility: Supine to Sit     Supine to sit: Mod assist;+2 for physical assistance     General bed mobility comments: Assistance for BLE management and trunk support into sitting  Transfers Overall transfer level: Needs assistance Equipment used: Rolling walker (2 wheeled);Straight cane Transfers: Sit to/from [employer];Stand Pivot Transfers Sit to Stand: Max assist;+2 physical assistance;+2 safety/equipment Stand pivot transfers: Mod assist;+2 physical assistance;+2 safety/equipment       General transfer comment: SPC and RW for sit to stand,  RW for stand pivot transfer.     Balance Overall balance assessment: Needs assistance Sitting-balance support: No upper extremity supported;Feet supported Sitting balance-Leahy Scale: Fair     Standing balance support: Bilateral upper extremity supported;During functional activity Standing balance-Leahy Scale: Fair Standing balance comment: pt with heavy reliance on RW    ADL Overall ADL's : Needs assistance/impaired Eating/Feeding: Set up;Sitting   Grooming: Set up;Bed level   Upper Body Bathing: Minimal assitance;Sitting   Lower Body Bathing: Moderate assistance;Sit to/from stand   Upper Body Dressing : Minimal assistance;Sitting   Lower Body Dressing: Maximal assistance;Sit to/from stand   Toilet Transfer: +2 for physical assistance;+2 for safety/equipment;Maximal assistance;RW Toilet Transfer Details (indicate cue type and reason): simulated EOB to recliner. Pt using SPC and RW per his request  Functional mobility during ADLs: Maximal assistance;Rolling walker;Cueing for sequencing;Cueing for safety;+2 for safety/equipment;+2 for physical assistance (+SPC) General ADL Comments: Pt limited by increased pain and generalized weakness.    Vision Additional Comments: no change from baseline          Pertinent Vitals/Pain Pain Assessment: 0-10 Pain Score: 6  Pain Location: LLE Pain Descriptors / Indicators: Aching;Sore;Grimacing;Guarding Pain Intervention(s): Limited activity within patient's tolerance;Monitored during session;Repositioned     Hand Dominance Right   Extremity/Trunk Assessment Upper Extremity Assessment Upper Extremity Assessment: Generalized weakness   Lower Extremity Assessment Lower Extremity Assessment: Defer to PT evaluation   Cervical / Trunk Assessment Cervical / Trunk Assessment: Normal   Communication Communication Communication: No difficulties   Cognition Arousal/Alertness: Awake/alert Behavior During Therapy: WFL for tasks  assessed/performed Overall Cognitive Status: Within Functional Limits for tasks assessed              Home Living Family/patient expects to  be discharged to:: Private residence Living Arrangements: Spouse/significant other Available Help at Discharge: Family;Available PRN/intermittently Type of Home: Apartment Home Access: Level entry     Home Layout: One level     Bathroom Shower/Tub: Tub/shower unit;Curtain   Bathroom Toilet: Handicapped height     Home Equipment: Bedside commode;Walker - 4 wheels;Cane - single point;Tub bench   Prior Functioning/Environment Level of Independence: Independent     OT Diagnosis: Generalized weakness;Acute pain   OT Problem List: Decreased strength;Decreased activity tolerance;Impaired balance (sitting and/or standing);Decreased safety awareness;Decreased knowledge of use of DME or AE;Decreased knowledge of precautions   OT Treatment/Interventions: Self-care/ADL training;Therapeutic exercise;Energy conservation;DME and/or AE instruction;Therapeutic activities;Patient/family education;Balance training    OT Goals(Current goals can be found in the care plan section) Acute Rehab OT Goals Patient Stated Goal: get better OT Goal Formulation: With patient/family Time For Goal Achievement: 08/05/15 Potential to Achieve Goals: Good ADL Goals Pt Will Perform Grooming: with set-up;sitting Pt Will Perform Lower Body Bathing: with min assist;sit to/from stand;with adaptive equipment Pt Will Perform Lower Body Dressing: with min assist;sit to/from stand;with adaptive equipment Pt Will Transfer to Toilet: with min assist;ambulating;bedside commode Additional ADL Goal #1: Pt will perform bed mobility with supervision to decrease burden of care Additional ADL Goal #2: Pt will be min assist for sit to/from stand using LRAD to decrease burden of care  OT Frequency: Min 2X/week   Barriers to D/C: Decreased caregiver support       Co-evaluation  PT/OT/SLP Co-Evaluation/Treatment: Yes Reason for Co-Treatment: For patient/therapist safety   OT goals addressed during session: ADL's and self-care;Strengthening/ROM      End of Session Equipment Utilized During Treatment: Gait belt;Rolling walker  Activity Tolerance: Patient tolerated treatment well Patient left: in chair;with call bell/phone within reach;with family/visitor present   Time: 4098-1191 OT Time Calculation (min): 29 min Charges:  OT General Charges $OT Visit: 1 Procedure OT Evaluation $OT Eval Moderate Complexity: 1 Procedure  Edwin Cap , MS, OTR/L, CLT Pager: [phone]  07/22/2015, 4:16 PM

## 2015-07-22 NOTE — Progress Notes (Signed)
SPoke to D rBlackman r/t HR 130's no new orders

## 2015-07-22 NOTE — Care Management Note (Signed)
Case Management Note  Patient Details  Name: [name] MRN: [id number] Date of Birth: 09/13/1975  Subjective/Objective:                 Independent patient from home, admitted w/ ORIF hip.  [name]   Action/Plan:  [name] and RW when medically cleared by MD.  Expected Discharge Date:                  Expected Discharge Plan:  [name]  In-House Referral:     Discharge planning Services  CM Consult  Post Acute Care Choice:  Home Health Choice offered to:  Patient  DME Arranged:  [name] Agency:  [facility]  [facility]  Status of Service:  Completed, signed off  Medicare Important Message Given:    Date Medicare IM Given:    Medicare IM give by:    Date Additional Medicare IM Given:    Additional Medicare Important Message give by:     If discussed at Long Length of Stay Meetings, dates discussed:    Additional Comments:  Lawerance SabalDebbie Samyuktha Brau, RN 07/22/2015, 11:34 AM

## 2015-07-22 NOTE — Progress Notes (Signed)
Inpatient Rehabilitation  OT is recommending IP Rehab.  At this time we are recommending IP Rehab consult, however [employer] would have to approve for pt. to be admitted to CIR.  If you would like us to pursue an IP Rehab admission, please place order for IP Rehab consult.  Please call if questions.  Weldon PickingSusan Kaidyn Hernandes PT Inpatient Rehab Admissions Coordinator Cell [phone] Office [phone]

## 2015-07-22 NOTE — Progress Notes (Signed)
Pt HR trending in the 130s, Paged Oncall orthopedic and made aware of patient heart rate. No new orders given will continue to monitor.

## 2015-07-23 DIAGNOSIS — Z96649 Presence of unspecified artificial hip joint: Secondary | ICD-10-CM

## 2015-07-23 DIAGNOSIS — M1612 Unilateral primary osteoarthritis, left hip: Principal | ICD-10-CM

## 2015-07-23 LAB — CBC
HCT: 31.1 % — ABNORMAL LOW (ref 39.0–52.0)
HEMOGLOBIN: 10.1 g/dL — AB (ref 13.0–17.0)
MCH: 30.2 pg (ref 26.0–34.0)
MCHC: 32.5 g/dL (ref 30.0–36.0)
MCV: 93.1 fL (ref 78.0–100.0)
Platelets: 314 10*3/uL (ref 150–400)
RBC: 3.34 MIL/uL — ABNORMAL LOW (ref 4.22–5.81)
RDW: 13.3 % (ref 11.5–15.5)
WBC: 10.9 10*3/uL — ABNORMAL HIGH (ref 4.0–10.5)

## 2015-07-23 LAB — GLUCOSE, CAPILLARY
GLUCOSE-CAPILLARY: 174 mg/dL — AB (ref 65–99)
Glucose-Capillary: 145 mg/dL — ABNORMAL HIGH (ref 65–99)
Glucose-Capillary: 168 mg/dL — ABNORMAL HIGH (ref 65–99)

## 2015-07-23 NOTE — H&P (Signed)
  Physical Medicine and Rehabilitation Admission H&P    Chief complaint: Hip pain  HPI: [name] is a 40 y.o. right handed male with diabetes mellitus, morbid obesity 343 pounds. Patient lives with his wife. Independent prior to admission using a walker and cane. One level apartment with level entry. Family can assist as needed. Presented 07/21/2015 with functional deficits secondary to left hip pain due to severe osteoarthritis. No change with conservative care. Underwent left total hip arthroplasty anterior approach 07/21/2015 per Dr. Blackman. Weightbearing as tolerated with anterior hip precautions. Hospital course pain management. Acute blood loss anemia 10.2 and monitored. Presently on aspirin 325 mg twice a day for DVT prophylaxis. Physical therapy evaluation completed with recommendations of physical medicine rehabilitation consult. Patient was admitted for comprehensive rehabilitation program  ROS Constitutional: Negative for fever and chills.  Eyes: Negative for blurred vision and double vision.  Respiratory: Negative for cough and shortness of breath.  Cardiovascular: Negative for chest pain and palpitations.  Gastrointestinal: Positive for constipation. Negative for nausea and vomiting.  Genitourinary: Negative for dysuria and hematuria.  Musculoskeletal: Positive for myalgias and joint pain.  Skin: Negative for rash.  Neurological: Negative for seizures, loss of consciousness and headaches.  All other systems reviewed and are negative   Past Medical History  Diagnosis Date  . Hypertension   . Diabetes mellitus without complication (HCC)   . Arthritis   . Anemia   . Anginal pain (HCC)   . Sleep apnea     wore CPAP prior to tonsillectomy  . Asthma     as a child  . H/O transfusion of packed red blood cells     with prior lumbar fusions x2   Past Surgical History  Procedure Laterality Date  . Tonsillectomy  2005     for sleep apnea  . Wisdom tooth extraction     . Back surgery      lumbar fusion  . Total hip arthroplasty Left 07/21/2015    Procedure: LEFT TOTAL HIP ARTHROPLASTY ANTERIOR APPROACH;  Surgeon: Christopher Y Blackman, MD;  Location: [facility] OR;  Service: Orthopedics;  Laterality: Left;   History reviewed. No pertinent family history. Social History:  reports that he quit smoking about 3 years ago. His smoking use included Cigarettes. He has a 10 pack-year smoking history. He does not have any smokeless tobacco history on file. He reports that he does not drink alcohol or use illicit drugs. Allergies:  Allergies  Allergen Reactions  . Bee Venom Anaphylaxis, Swelling and Other (See Comments)    Swelling primarily at sting site, but can be all over.  Epi pen prn   Medications Prior to Admission  Medication Sig Dispense Refill  . aspirin EC 81 MG tablet Take 81 mg by mouth daily.     . canagliflozin (INVOKANA) 100 MG TABS tablet Take 100 mg by mouth daily before breakfast.    . celecoxib (CELEBREX) 200 MG capsule Take 200 mg by mouth 2 (two) times daily.  6  . cyclobenzaprine (FLEXERIL) 10 MG tablet Take 10 mg by mouth 3 (three) times daily.  2  . diclofenac (VOLTAREN) 75 MG EC tablet Take 75 mg by mouth 2 (two) times daily.    . EPINEPHrine (EPIPEN 2-PAK) 0.3 mg/0.3 mL IJ SOAJ injection Inject 0.3 mg into your thigh or buttocks after a bee sting and then call 911    . OVER THE COUNTER MEDICATION Take 2 capsules by mouth 2 (two) times daily. B Caps    .   oxyCODONE-acetaminophen (PERCOCET/ROXICET) 5-325 MG per tablet Take 1-2 tablets by mouth every 6 (six) hours as needed for severe pain. 90 tablet 0  . tiZANidine (ZANAFLEX) 4 MG tablet Take 4 mg by mouth 2 (two) times daily.  0  . triamterene-hydrochlorothiazide (MAXZIDE) 75-50 MG per tablet Take 1 tablet by mouth daily.    . indomethacin (INDOCIN) 50 MG capsule Take 1 capsule (50 mg total) by mouth 2 (two) times daily with a meal. (Patient not taking: Reported on 07/09/2015) 50 capsule 1  .  lidocaine (XYLOCAINE) 5 % ointment Apply 1 application topically 4 (four) times daily as needed for mild pain or moderate pain.    . metaxalone (SKELAXIN) 800 MG tablet Take 1 tablet (800 mg total) by mouth 3 (three) times daily. (Patient not taking: Reported on 07/09/2015) 60 tablet 1  . Naftifine HCl (NAFTIN) 2 % CREA Apply to affected area twice daily (Patient not taking: Reported on 07/09/2015) 60 g 3    Home: Home Living Family/patient expects to be discharged to:: Private residence Living Arrangements: Spouse/significant other Available Help at Discharge: Family, Available PRN/intermittently Type of Home: Apartment Home Access: Level entry Home Layout: One level Bathroom Shower/Tub: Tub/shower unit, Curtain Bathroom Toilet: Handicapped height Bathroom Accessibility: Yes Home Equipment: Bedside commode, Walker - 4 wheels, Cane - single point, Tub bench   Functional History: Prior Function Level of Independence: Independent with assistive device(s) Comments: has SPC for mobility  Functional Status:  Mobility: Bed Mobility Overal bed mobility: + 2 for safety/equipment Bed Mobility: Supine to Sit Supine to sit: Mod assist, +2 for physical assistance General bed mobility comments: heavy use of rail for lifting trunk, PT assist with L leg and pt moved R leg Transfers Overall transfer level: Needs assistance Equipment used: Straight cane Transfers: Sit to/from Stand Sit to Stand: Mod assist, From elevated surface Stand pivot transfers: Mod assist, +2 physical assistance, +2 safety/equipment (initiated with SPC then transitioned to walker) General transfer comment: used SPC and bed rail to push up to stand, lifting assist also provided Ambulation/Gait Ambulation/Gait assistance: Min assist, +2 safety/equipment Ambulation Distance (Feet): 80 Feet Assistive device: Rolling walker (2 wheeled) Gait Pattern/deviations: Antalgic, Decreased stride length, Wide base of support, Shuffle,  Step-to pattern General Gait Details: initial difficutly progressing R LE , c/o pain walking without shoes on due to bunion pain, multiple stops to rest with pt fatigued and sweaty throughout and with great effort to move forward, switched walkers halfway through due to bariatric walker being delivered  Cues for upright posture and adjusted walker for height  Took 30 minutes to walk    ADL: ADL Overall ADL's : Needs assistance/impaired Eating/Feeding: Set up, Sitting Grooming: Set up, Bed level Upper Body Bathing: Minimal assitance, Sitting Lower Body Bathing: Moderate assistance, Sit to/from stand Upper Body Dressing : Minimal assistance, Sitting Lower Body Dressing: Maximal assistance, Sit to/from stand Toilet Transfer: +2 for physical assistance, +2 for safety/equipment, Maximal assistance, RW Toilet Transfer Details (indicate cue type and reason): simulated EOB to recliner. Pt using SPC and RW per his request  Functional mobility during ADLs: Maximal assistance, Rolling walker, Cueing for sequencing, Cueing for safety, +2 for safety/equipment, +2 for physical assistance (+SPC) General ADL Comments: Pt limited by increased pain and generalized weakness.   Cognition: Cognition Overall Cognitive Status: Within Functional Limits for tasks assessed Orientation Level: Oriented X4 Cognition Arousal/Alertness: Awake/alert Behavior During Therapy: WFL for tasks assessed/performed Overall Cognitive Status: Within Functional Limits for tasks assessed  Physical Exam: Blood pressure   110/80, pulse 120, temperature 99.1 F (37.3 C), temperature source Oral, resp. rate 18, height 6' 2" (1.88 m), weight 155.584 kg (343 lb), SpO2 91 %. Physical Exam Constitutional: He is oriented to person, place, and time.  40-year-old right-handed morbidly obese male  HENT: oral mucosa pink and moist Head: Normocephalic.  Eyes: EOM are normal.  Neck: Normal range of motion. Neck supple. No thyromegaly  present.  Cardiovascular: Normal rate and regular rhythm.  Respiratory: Effort normal and breath sounds normal. No respiratory distress.  GI: Soft. Bowel sounds are normal. He exhibits no distension.  Musculoskeletal:  Left hip limited ROM due to pain. Continued tenderness with AROM/PROM Right hip. bilateral MTP's are less tender with palpation. No redness or warmth seen in the feet.  Neurological: He is alert and oriented to person, place, and time.  5/5 UE. LLE 1/5 HF, ke and 3/5 adf/pf. RLE: 2/5 hf, k3 and 3/5 adf/pf. No sensory deficits.  Skin:  Hip incision is dressed, clean with minimal drainage  Psychiatric: He has a normal mood and affect. His behavior is normal   Results for orders placed or performed during the hospital encounter of 07/21/15 (from the past 48 hour(s))  Glucose, capillary     Status: Abnormal   Collection Time: 07/22/15  5:46 PM  Result Value Ref Range   Glucose-Capillary 207 (H) 65 - 99 mg/dL  Glucose, capillary     Status: Abnormal   Collection Time: 07/22/15 10:32 PM  Result Value Ref Range   Glucose-Capillary 145 (H) 65 - 99 mg/dL  CBC     Status: Abnormal   Collection Time: 07/23/15  6:37 AM  Result Value Ref Range   WBC 10.9 (H) 4.0 - 10.5 K/uL   RBC 3.34 (L) 4.22 - 5.81 MIL/uL   Hemoglobin 10.1 (L) 13.0 - 17.0 g/dL   HCT 31.1 (L) 39.0 - 52.0 %   MCV 93.1 78.0 - 100.0 fL   MCH 30.2 26.0 - 34.0 pg   MCHC 32.5 30.0 - 36.0 g/dL   RDW 13.3 11.5 - 15.5 %   Platelets 314 150 - 400 K/uL  Glucose, capillary     Status: Abnormal   Collection Time: 07/23/15  8:09 AM  Result Value Ref Range   Glucose-Capillary 145 (H) 65 - 99 mg/dL  Glucose, capillary     Status: Abnormal   Collection Time: 07/23/15 11:57 AM  Result Value Ref Range   Glucose-Capillary 168 (H) 65 - 99 mg/dL  Glucose, capillary     Status: Abnormal   Collection Time: 07/23/15  5:13 PM  Result Value Ref Range   Glucose-Capillary 174 (H) 65 - 99 mg/dL  Glucose, capillary     Status:  Abnormal   Collection Time: 07/23/15 10:10 PM  Result Value Ref Range   Glucose-Capillary 210 (H) 65 - 99 mg/dL  Glucose, capillary     Status: Abnormal   Collection Time: 07/24/15  8:08 AM  Result Value Ref Range   Glucose-Capillary 155 (H) 65 - 99 mg/dL  Glucose, capillary     Status: Abnormal   Collection Time: 07/24/15 11:47 AM  Result Value Ref Range   Glucose-Capillary 180 (H) 65 - 99 mg/dL   No results found.     Medical Problem List and Plan: 1.  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  -begin CIR  - Weightbearing as tolerated LLE with anterior hip precautions 2.  DVT Prophylaxis/Anticoagulation: Aspirin 325   mg twice a day. Check vascular study on admit. 3. Pain Management: Oxycodone and Robaxin as needed. 4. Acute blood loss anemia. Follow-up CBC 5. Neuropsych: This patient is capable of making decisions on his own behalf. 6. Skin/Wound Care: Routine skin checks 7. Fluids/Electrolytes/Nutrition: Routine I&O with follow-up chemistries upon admit. 8. Hypertension. Maxzide 1 tablet daily. Monitor with increased mobility 9. Diabetes mellitus of peripheral neuropathy. Hemoglobin A1c 6.6.Invokana 100 mg daily. Check blood sugars before meals and at bedtime. Diabetic teaching 10. Morbid obesity. Dietary follow-up   Post Admission Physician Evaluation: 1. Functional deficits secondary  to endstage OA left hip s/p left THA. 2. Patient is admitted to receive collaborative, interdisciplinary care between the physiatrist, rehab nursing staff, and therapy team. 3. Patient's level of medical complexity and substantial therapy needs in context of that medical necessity cannot be provided at a lesser intensity of care such as a SNF. 4. Patient has experienced substantial functional loss from his/her baseline which was documented above under the "Functional History" and "Functional Status"  headings.  Judging by the patient's diagnosis, physical exam, and functional history, the patient has potential for functional progress which will result in measurable gains while on inpatient rehab.  These gains will be of substantial and practical use upon discharge  in facilitating mobility and self-care at the household level. 5. Physiatrist will provide 24 hour management of medical needs as well as oversight of the therapy plan/treatment and provide guidance as appropriate regarding the interaction of the two. 6. 24 hour rehab nursing will assist with bladder management, bowel management, safety, skin/wound care, disease management, medication administration, pain management and patient education  and help integrate therapy concepts, techniques,education, etc. 7. PT will assess and treat for/with: Lower extremity strength, range of motion, stamina, balance, functional mobility, safety, adaptive techniques and equipment, pain mgt, hip precautions, commuity reintegration, ego support.   Goals are: mod I. 8. OT will assess and treat for/with: ADL's, functional mobility, safety, upper extremity strength, adaptive techniques and equipment, pain control, hip precautions, ego support, community reintegration.   Goals are: mod I. Therapy may proceed with showering this patient if hip incision is covered.  9. SLP will assess and treat for/with: n/a.  Goals are: n/a. 10. Case Management and Social Worker will assess and treat for psychological issues and discharge planning. 11. Team conference will be held weekly to assess progress toward goals and to determine barriers to discharge. 12. Patient will receive at least 3 hours of therapy per day at least 5 days per week. 13. ELOS: 8-12 days       14. Prognosis:  excellent     Carlton Sweaney T. Nohelani Benning, MD, FAAPMR [place] [facility] 07/24/2015    

## 2015-07-23 NOTE — Progress Notes (Signed)
Patient ID: Nicholas RearDarius D Villafranca, male   DOB: 05/27/1975, 40 y.o.   MRN: [id number] The patient is actually doing very well.  His tachycardia is coming down.  His vitals are otherwise stable.  He is mobilizing very well with therapy.  He is afebrile and clinically looks great.  His dressing on the left hip is clean and dry.  Therapy is recommending an Inpatient Rehab consult.

## 2015-07-23 NOTE — Progress Notes (Signed)
Physical Therapy Treatment Patient Details Name: JOURDIN CONNORS MRN: [id number] DOB: 11-08-1975 Today's Date: 07/23/2015    History of Present Illness 40 yo male s/p L THA (anterior approach). PMH includes: HTN, DM, OSA, anemia, asthma (as a child). Former smoker. BMI 39. S/p lumbar fusion 01/14/14 and 07/30/12.     PT Comments    Patient progressing to ambulation in hallway this session.  Remains impaired with activity tolerance, pain, weakness and decreased ROM (in operative and nonoperative leg), and decreased balance requiring increased time for all mobility.  Continue to feel he is appropriate for CIR level rehab at d/c.  Follow Up Recommendations  CIR     Equipment Recommendations  Rolling walker with 5" wheels (bariatric)    Recommendations for Other Services       Precautions / Restrictions Precautions Precautions: Fall Precaution Comments: direct anterior, no precautions Restrictions LLE Weight Bearing: Weight bearing as tolerated    Mobility  Bed Mobility Overal bed mobility: + 2 for safety/equipment Bed Mobility: Supine to Sit     Supine to sit: Mod assist;+2 for physical assistance     General bed mobility comments: heavy use of rail for lifting trunk, PT assist with L leg and pt moved R leg  Transfers Overall transfer level: Needs assistance Equipment used: Straight cane Transfers: Sit to/from Stand Sit to Stand: Mod assist;From elevated surface         General transfer comment: used SPC and bed rail to push up to stand, lifting assist also provided  Ambulation/Gait Ambulation/Gait assistance: Min assist;+2 safety/equipment Ambulation Distance (Feet): 80 Feet Assistive device: Rolling walker (2 wheeled) Gait Pattern/deviations: Antalgic;Decreased stride length;Wide base of support;Shuffle;Step-to pattern     General Gait Details: initial difficutly progressing R LE , c/o pain walking without shoes on due to bunion pain, multiple stops to rest with  pt fatigued and sweaty throughout and with great effort to move forward, switched walkers halfway through due to bariatric walker being delivered  Cues for upright posture and adjusted walker for height  Took 30 minutes to walk   Stairs            Wheelchair Mobility    Modified Rankin (Stroke Patients Only)       Balance Overall balance assessment: Needs assistance   Sitting balance-Leahy Scale: Good     Standing balance support: Bilateral upper extremity supported Standing balance-Leahy Scale: Poor Standing balance comment: heavy UE support needed                    Cognition Arousal/Alertness: Awake/alert Behavior During Therapy: WFL for tasks assessed/performed Overall Cognitive Status: Within Functional Limits for tasks assessed                      Exercises Total Joint Exercises Ankle Circles/Pumps: AROM;Both;15 reps;Supine Short Arc Quad: AROM;Both;10 reps;Supine Heel Slides: AAROM;Both;Other (comment) (8 reps with lots of increased time required) Hip ABduction/ADduction: AAROM;Both;10 reps;Supine    General Comments        Pertinent Vitals/Pain Pain Score: 6  Pain Location: L hip lying still Pain Descriptors / Indicators: Sore;Tightness Pain Intervention(s): Limited activity within patient's tolerance;Monitored during session;Repositioned    Home Living                      Prior Function            PT Goals (current goals can now be found in the care plan section) Progress towards PT  goals: Progressing toward goals    Frequency  7X/week    PT Plan Current plan remains appropriate    Co-evaluation             End of Session Equipment Utilized During Treatment: Gait belt Activity Tolerance: Patient limited by fatigue Patient left: Other (comment);[facility]     Time: [id number] PT Time Calculation (min) (ACUTE ONLY): 53 min  Charges:  $Gait Training: 23-37 mins $Therapeutic  Exercise: 23-37 mins                    G Codes:      Elray McgregorCynthia Kristyn Obyrne 07/23/2015, 1:08 PM  Sheran Lawlessyndi Opaline Reyburn, PT [phone] 07/23/2015

## 2015-07-23 NOTE — Consult Note (Signed)
Physical Medicine and Rehabilitation Consult Reason for Consult: Left total hip arthroplasty secondary to severe osteoarthritis Referring Physician: Dr. Magnus Ivan   HPI: [name] is a 40 y.o. right handed male with diabetes mellitus, morbid obesity 343 pounds. Patient lives with his wife. Independent prior to admission using a walker and cane. One level apartment with level entry. Family can assist as needed. Presented 07/21/2015 with functional deficits secondary to left hip pain due to severe osteoarthritis. No change with conservative care. Underwent left total hip arthroplasty anterior approach 07/21/2015 per Dr. Magnus Ivan. Weightbearing as tolerated with anterior hip precautions. Hospital course pain management. Acute blood loss anemia 10.2 and monitored. Presently on aspirin 325 mg twice a day for DVT prophylaxis.   Review of Systems  Constitutional: Negative for fever and chills.  Eyes: Negative for blurred vision and double vision.  Respiratory: Negative for cough and shortness of breath.   Cardiovascular: Negative for chest pain and palpitations.  Gastrointestinal: Positive for constipation. Negative for nausea and vomiting.  Genitourinary: Negative for dysuria and hematuria.  Musculoskeletal: Positive for myalgias and joint pain.  Skin: Negative for rash.  Neurological: Negative for seizures, loss of consciousness and headaches.  All other systems reviewed and are negative.  Past Medical History  Diagnosis Date  . Hypertension   . Diabetes mellitus without complication (HCC)   . Arthritis   . Anemia   . Anginal pain (HCC)   . Sleep apnea     wore CPAP prior to tonsillectomy  . Asthma     as a child  . H/O transfusion of packed red blood cells     with prior lumbar fusions x2   Past Surgical History  Procedure Laterality Date  . Tonsillectomy  2005     for sleep apnea  . Wisdom tooth extraction    . Back surgery      lumbar fusion  . Total hip  arthroplasty Left 07/21/2015    Procedure: LEFT TOTAL HIP ARTHROPLASTY ANTERIOR APPROACH;  Surgeon: Kathryne Hitch, MD;  Location: [facility];  Service: Orthopedics;  Laterality: Left;   History reviewed. No pertinent family history. Social History:  reports that he quit smoking about 3 years ago. His smoking use included Cigarettes. He has a 10 pack-year smoking history. He does not have any smokeless tobacco history on file. He reports that he does not drink alcohol or use illicit drugs. Allergies:  Allergies  Allergen Reactions  . Bee Venom Anaphylaxis, Swelling and Other (See Comments)    Swelling primarily at sting site, but can be all over.  Epi pen prn   Medications Prior to Admission  Medication Sig Dispense Refill  . aspirin EC 81 MG tablet Take 81 mg by mouth daily.     . canagliflozin (INVOKANA) 100 MG TABS tablet Take 100 mg by mouth daily before breakfast.    . celecoxib (CELEBREX) 200 MG capsule Take 200 mg by mouth 2 (two) times daily.  6  . cyclobenzaprine (FLEXERIL) 10 MG tablet Take 10 mg by mouth 3 (three) times daily.  2  . diclofenac (VOLTAREN) 75 MG EC tablet Take 75 mg by mouth 2 (two) times daily.    Marland Kitchen EPINEPHrine (EPIPEN 2-PAK) 0.3 mg/0.3 mL IJ SOAJ injection Inject 0.3 mg into your thigh or buttocks after a bee sting and then call 911    . OVER THE COUNTER MEDICATION Take 2 capsules by mouth 2 (two) times daily. B Caps    . oxyCODONE-acetaminophen (  PERCOCET/ROXICET) 5-325 MG per tablet Take 1-2 tablets by mouth every 6 (six) hours as needed for severe pain. 90 tablet 0  . tiZANidine (ZANAFLEX) 4 MG tablet Take 4 mg by mouth 2 (two) times daily.  0  . triamterene-hydrochlorothiazide (MAXZIDE) 75-50 MG per tablet Take 1 tablet by mouth daily.    . indomethacin (INDOCIN) 50 MG capsule Take 1 capsule (50 mg total) by mouth 2 (two) times daily with a meal. (Patient not taking: Reported on 07/09/2015) 50 capsule 1  . lidocaine (XYLOCAINE) 5 % ointment Apply 1 application  topically 4 (four) times daily as needed for mild pain or moderate pain.    . metaxalone (SKELAXIN) 800 MG tablet Take 1 tablet (800 mg total) by mouth 3 (three) times daily. (Patient not taking: Reported on 07/09/2015) 60 tablet 1  . Naftifine HCl (NAFTIN) 2 % CREA Apply to affected area twice daily (Patient not taking: Reported on 07/09/2015) 60 g 3    Home: Home Living Family/patient expects to be discharged to:: Private residence Living Arrangements: Spouse/significant other Available Help at Discharge: Family, Available PRN/intermittently Type of Home: Apartment Home Access: Level entry Home Layout: One level Bathroom Shower/Tub: Tub/shower unit, Engineer, building services: Handicapped height Bathroom Accessibility: Yes Home Equipment: Bedside commode, Environmental consultant - 4 wheels, Cane - single point, Tub bench  Functional History: Prior Function Level of Independence: Independent with assistive device(s) Comments: has SPC for mobility Functional Status:  Mobility: Bed Mobility Overal bed mobility: + 2 for safety/equipment Bed Mobility: Supine to Sit Supine to sit: Mod assist, +2 for physical assistance General bed mobility comments: assist for trunk and LE's with pt's height an issue Transfers Overall transfer level: Needs assistance Equipment used: Rolling walker (2 wheeled), Straight cane, 2 person hand held assist Transfers: Sit to/from Stand, Stand Pivot Transfers Sit to Stand: Max assist, +2 physical assistance, +2 safety/equipment Stand pivot transfers: Mod assist, +2 physical assistance, +2 safety/equipment (initiated with SPC then transitioned to walker) General transfer comment: SPC and RW for sit to stand, RW for stand pivot transfer.  Ambulation/Gait General Gait Details: Transfer to chair with minimal stepping    ADL: ADL Overall ADL's : Needs assistance/impaired Eating/Feeding: Set up, Sitting Grooming: Set up, Bed level Upper Body Bathing: Minimal assitance,  Sitting Lower Body Bathing: Moderate assistance, Sit to/from stand Upper Body Dressing : Minimal assistance, Sitting Lower Body Dressing: Maximal assistance, Sit to/from stand Toilet Transfer: +2 for physical assistance, +2 for safety/equipment, Maximal assistance, RW Toilet Transfer Details (indicate cue type and reason): simulated EOB to recliner. Pt using SPC and RW per his request  Functional mobility during ADLs: Maximal assistance, Rolling walker, Cueing for sequencing, Cueing for safety, +2 for safety/equipment, +2 for physical assistance (+SPC) General ADL Comments: Pt limited by increased pain and generalized weakness.   Cognition: Cognition Overall Cognitive Status: Within Functional Limits for tasks assessed Orientation Level: Oriented X4 Cognition Arousal/Alertness: Awake/alert Behavior During Therapy: WFL for tasks assessed/performed Overall Cognitive Status: Within Functional Limits for tasks assessed  Blood pressure 115/74, pulse 123, temperature 99 F (37.2 C), temperature source Oral, resp. rate 16, height  (1.88 m), weight 155.584 kg (343 lb), SpO2 95 %. Physical Exam  Constitutional: He is oriented to person, place, and time.  40 year old right-handed morbidly obese male  HENT:  Head: Normocephalic.  Eyes: EOM are normal.  Neck: Normal range of motion. Neck supple. No thyromegaly present.  Cardiovascular: Normal rate and regular rhythm.   Respiratory: Effort normal and breath sounds normal.  No respiratory distress.  GI: Soft. Bowel sounds are normal. He exhibits no distension.  Musculoskeletal:  Left hip limited due to pain. Also has tenderness with AROM/PROM Right hip. Both MTp's are tender with PROM. No redness or warmth seen in the feet.   Neurological: He is alert and oriented to person, place, and time.  5/5 UE. LLE 1/5 hF, ke and 3/5 adf/pf.   RLE: 2/5 hf, k3 and 3/5 adf/pf. No sensory deficits.  Skin:  Hip  incision is dressed appropriately tender.   Psychiatric: He has a normal mood and affect. His behavior is normal.    Results for orders placed or performed during the hospital encounter of 07/21/15 (from the past 24 hour(s))  Glucose, capillary     Status: Abnormal   Collection Time: 07/22/15  2:42 PM  Result Value Ref Range   Glucose-Capillary 251 (H) 65 - 99 mg/dL  Glucose, capillary     Status: Abnormal   Collection Time: 07/22/15  5:46 PM  Result Value Ref Range   Glucose-Capillary 207 (H) 65 - 99 mg/dL  Glucose, capillary     Status: Abnormal   Collection Time: 07/22/15 10:32 PM  Result Value Ref Range   Glucose-Capillary 145 (H) 65 - 99 mg/dL   Dg Hip Port Unilat With Pelvis 1v Left  07/21/2015  CLINICAL DATA:  Postop for left hip arthroplasty. EXAM: DG HIP (WITH OR WITHOUT PELVIS) 1V PORT LEFT COMPARISON:  Intraoperative films of earlier today. FINDINGS: AP and lateral views. The cross-table laterals are suboptimal secondary to patient body habitus. Given this factor, expected appearance of left hip arthroplasty, without acute hardware complication or periprosthetic fracture. Moderate to severe, significantly age advanced right hip osteoarthritis. Marked decreased joint space with subchondral cyst formation. IMPRESSION: Expected appearance after left hip arthroplasty. Moderate-to-marked right hip osteoarthritis. Electronically Signed   By: Jeronimo Greaves M.D.   On: 07/21/2015 12:07   Dg Hip Operative Unilat With Pelvis Left  07/21/2015  CLINICAL DATA:  Left hip replacement EXAM: OPERATIVE LEFT HIP (WITH PELVIS IF PERFORMED) two VIEWS TECHNIQUE: Fluoroscopic spot image(s) were submitted for interpretation post-operatively. COMPARISON:  None. FINDINGS: Intraoperative spot imaging demonstrates changes of left hip replacement. Normal AP alignment. No hardware or bony complicating feature noted. IMPRESSION: Left hip replacement.  No visible complicating feature. Electronically Signed   By: Charlett Nose M.D.   On: 07/21/2015 10:47     Assessment/Plan: Diagnosis: 40 yo obese male s/p Left THA 1. Does the need for close, 24 hr/day medical supervision in concert with the patient's rehab needs make it unreasonable for this patient to be served in a less intensive setting? Yes 2. Co-Morbidities requiring supervision/potential complications: endstage OA of right hip. Lumbar stenosis with neurogenic claudication 3. Due to bladder management, bowel management, safety, skin/wound care, disease management, medication administration, pain management and patient education, does the patient require 24 hr/day rehab nursing? Yes 4. Does the patient require coordinated care of a physician, rehab nurse, PT (1-2 hrs/day, 5 days/week) and OT (1-2 hrs/day, 5 days/week) to address physical and functional deficits in the context of the above medical diagnosis(es)? Yes Addressing deficits in the following areas: balance, endurance, locomotion, strength, transferring, bowel/bladder control, bathing, dressing, feeding, grooming, toileting and psychosocial support 5. Can the patient actively participate in an intensive therapy program of at least 3 hrs of therapy per day at least 5 days per week? Yes 6. The potential for patient to make measurable gains while on inpatient rehab is excellent 7. Anticipated  functional outcomes upon discharge from inpatient rehab are modified independent and supervision  with PT, modified independent and supervision with OT, n/a with SLP. 8. Estimated rehab length of stay to reach the above functional goals is: 7-11 days 9. Does the patient have adequate social supports and living environment to accommodate these discharge functional goals? Yes 10. Anticipated D/C setting: Home 11. Anticipated post D/C treatments: HH therapy and Outpatient therapy 12. Overall Rehab/Functional Prognosis: excellent  RECOMMENDATIONS: This patient's condition is appropriate for continued rehabilitative care in the following setting:  CIR Patient has agreed to participate in recommended program. Yes Note that insurance prior authorization may be required for reimbursement for recommended care.  Comment: Rehab Admissions Coordinator to follow up.  Thanks,  Ranelle OysterZachary T. Myrick Mcnairy, MD, [place] DomFAAPMR     07/23/2015

## 2015-07-24 ENCOUNTER — Inpatient Hospital Stay (HOSPITAL_COMMUNITY)
Admission: RE | Admit: 2015-07-24 | Discharge: 2015-08-03 | DRG: 560 | Disposition: A | Discharge status: Home Health | Payer: BLUE CROSS/BLUE SHIELD | Source: Intra-hospital | Attending: Physical Medicine & Rehabilitation | Admitting: Physical Medicine & Rehabilitation

## 2015-07-24 ENCOUNTER — Encounter (HOSPITAL_COMMUNITY): Payer: Self-pay | Admitting: *Deleted

## 2015-07-24 DIAGNOSIS — R609 Edema, unspecified: Secondary | ICD-10-CM

## 2015-07-24 DIAGNOSIS — Z6841 Body Mass Index (BMI) 40.0 and over, adult: Secondary | ICD-10-CM

## 2015-07-24 DIAGNOSIS — M10062 Idiopathic gout, left knee: Secondary | ICD-10-CM | POA: Diagnosis present

## 2015-07-24 DIAGNOSIS — K5900 Constipation, unspecified: Secondary | ICD-10-CM | POA: Diagnosis present

## 2015-07-24 DIAGNOSIS — M10061 Idiopathic gout, right knee: Secondary | ICD-10-CM | POA: Diagnosis present

## 2015-07-24 DIAGNOSIS — M1651 Unilateral post-traumatic osteoarthritis, right hip: Secondary | ICD-10-CM | POA: Diagnosis not present

## 2015-07-24 DIAGNOSIS — E1142 Type 2 diabetes mellitus with diabetic polyneuropathy: Secondary | ICD-10-CM | POA: Diagnosis present

## 2015-07-24 DIAGNOSIS — Z87891 Personal history of nicotine dependence: Secondary | ICD-10-CM | POA: Diagnosis not present

## 2015-07-24 DIAGNOSIS — R110 Nausea: Secondary | ICD-10-CM

## 2015-07-24 DIAGNOSIS — Z96642 Presence of left artificial hip joint: Secondary | ICD-10-CM | POA: Diagnosis present

## 2015-07-24 DIAGNOSIS — Z471 Aftercare following joint replacement surgery: Principal | ICD-10-CM

## 2015-07-24 DIAGNOSIS — M1611 Unilateral primary osteoarthritis, right hip: Secondary | ICD-10-CM

## 2015-07-24 DIAGNOSIS — E118 Type 2 diabetes mellitus with unspecified complications: Secondary | ICD-10-CM | POA: Diagnosis not present

## 2015-07-24 DIAGNOSIS — E869 Volume depletion, unspecified: Secondary | ICD-10-CM | POA: Diagnosis present

## 2015-07-24 DIAGNOSIS — I10 Essential (primary) hypertension: Secondary | ICD-10-CM | POA: Diagnosis present

## 2015-07-24 DIAGNOSIS — M1000 Idiopathic gout, unspecified site: Secondary | ICD-10-CM | POA: Diagnosis not present

## 2015-07-24 DIAGNOSIS — Z23 Encounter for immunization: Secondary | ICD-10-CM | POA: Diagnosis not present

## 2015-07-24 DIAGNOSIS — L299 Pruritus, unspecified: Secondary | ICD-10-CM | POA: Diagnosis not present

## 2015-07-24 DIAGNOSIS — D62 Acute posthemorrhagic anemia: Secondary | ICD-10-CM | POA: Diagnosis present

## 2015-07-24 DIAGNOSIS — Z96649 Presence of unspecified artificial hip joint: Secondary | ICD-10-CM | POA: Diagnosis not present

## 2015-07-24 LAB — GLUCOSE, CAPILLARY
GLUCOSE-CAPILLARY: 155 mg/dL — AB (ref 65–99)
GLUCOSE-CAPILLARY: 199 mg/dL — AB (ref 65–99)
Glucose-Capillary: 210 mg/dL — ABNORMAL HIGH (ref 65–99)
Glucose-Capillary: 180 mg/dL — ABNORMAL HIGH (ref 65–99)
Glucose-Capillary: 144 mg/dL — ABNORMAL HIGH (ref 65–99)

## 2015-07-24 MED ORDER — ONDANSETRON HCL 4 MG PO TABS
4.0000 mg | ORAL_TABLET | Freq: Four times a day (QID) | ORAL | Status: DC | PRN
  Administered 2015-07-29: 4 mg via ORAL
  Filled 2015-07-24: qty 1

## 2015-07-24 MED ORDER — DOCUSATE SODIUM 100 MG PO CAPS
100.0000 mg | ORAL_CAPSULE | Freq: Two times a day (BID) | ORAL | Status: DC
  Administered 2015-07-24 – 2015-07-26 (×4): 100 mg via ORAL
  Filled 2015-07-24 (×5): qty 1

## 2015-07-24 MED ORDER — TRIAMTERENE-HCTZ 75-50 MG PO TABS
1.0000 | ORAL_TABLET | Freq: Every day | ORAL | Status: DC
  Administered 2015-07-25 – 2015-08-03 (×10): 1 via ORAL
  Filled 2015-07-24 (×10): qty 1

## 2015-07-24 MED ORDER — ASPIRIN EC 325 MG PO TBEC
325.0000 mg | DELAYED_RELEASE_TABLET | Freq: Two times a day (BID) | ORAL | Status: DC
  Administered 2015-07-25 – 2015-08-03 (×19): 325 mg via ORAL
  Filled 2015-07-24 (×19): qty 1

## 2015-07-24 MED ORDER — METHOCARBAMOL 500 MG PO TABS
500.0000 mg | ORAL_TABLET | Freq: Four times a day (QID) | ORAL | Status: DC | PRN
  Administered 2015-07-25 – 2015-08-03 (×25): 500 mg via ORAL
  Filled 2015-07-24 (×26): qty 1

## 2015-07-24 MED ORDER — INFLUENZA VAC SPLIT QUAD 0.5 ML IM SUSY: 0.5000 mL | PREFILLED_SYRINGE | INTRAMUSCULAR | Status: DC

## 2015-07-24 MED ORDER — SORBITOL 70 % SOLN
30.0000 mL | Freq: Every day | Status: DC | PRN
  Administered 2015-07-25: 30 mL via ORAL
  Filled 2015-07-24: qty 30

## 2015-07-24 MED ORDER — OXYCODONE HCL 5 MG PO TABS
5.0000 mg | ORAL_TABLET | ORAL | Status: DC | PRN
  Administered 2015-07-24 – 2015-08-01 (×40): 15 mg via ORAL
  Filled 2015-07-24 (×41): qty 3

## 2015-07-24 MED ORDER — SORBITOL 70 % SOLN
30.0000 mL | Freq: Once | Status: AC
  Administered 2015-07-24: 30 mL via ORAL
  Filled 2015-07-24: qty 30

## 2015-07-24 MED ORDER — ACETAMINOPHEN 650 MG RE SUPP: 650.0000 mg | Freq: Four times a day (QID) | RECTAL | Status: DC | PRN

## 2015-07-24 MED ORDER — INSULIN ASPART 100 UNIT/ML ~~LOC~~ SOLN
0.0000 [IU] | Freq: Three times a day (TID) | SUBCUTANEOUS | Status: DC
  Administered 2015-07-25 – 2015-07-28 (×10): 4 [IU] via SUBCUTANEOUS
  Administered 2015-07-28: 3 [IU] via SUBCUTANEOUS
  Administered 2015-07-28 – 2015-07-29 (×3): 4 [IU] via SUBCUTANEOUS
  Administered 2015-07-30 (×2): 3 [IU] via SUBCUTANEOUS
  Administered 2015-07-30: 4 [IU] via SUBCUTANEOUS
  Administered 2015-07-31: 3 [IU] via SUBCUTANEOUS
  Administered 2015-07-31: 4 [IU] via SUBCUTANEOUS
  Administered 2015-07-31: 3 [IU] via SUBCUTANEOUS
  Administered 2015-08-01: 7 [IU] via SUBCUTANEOUS
  Administered 2015-08-01: 3 [IU] via SUBCUTANEOUS
  Administered 2015-08-02 (×2): 4 [IU] via SUBCUTANEOUS
  Administered 2015-08-03: 3 [IU] via SUBCUTANEOUS

## 2015-07-24 MED ORDER — ONDANSETRON HCL 4 MG/2ML IJ SOLN: 4.0000 mg | Freq: Four times a day (QID) | INTRAMUSCULAR | Status: DC | PRN

## 2015-07-24 MED ORDER — METHOCARBAMOL 1000 MG/10ML IJ SOLN
500.0000 mg | Freq: Four times a day (QID) | INTRAVENOUS | Status: DC | PRN
  Filled 2015-07-24: qty 5

## 2015-07-24 MED ORDER — ASPIRIN 325 MG PO TBEC: 325.0000 mg | DELAYED_RELEASE_TABLET | Freq: Two times a day (BID) | ORAL | Status: DC

## 2015-07-24 MED ORDER — CANAGLIFLOZIN 100 MG PO TABS
100.0000 mg | ORAL_TABLET | Freq: Every day | ORAL | Status: DC
  Administered 2015-07-25 – 2015-08-03 (×10): 100 mg via ORAL
  Filled 2015-07-24 (×10): qty 1

## 2015-07-24 MED ORDER — ACETAMINOPHEN 325 MG PO TABS: 650.0000 mg | ORAL_TABLET | Freq: Four times a day (QID) | ORAL | Status: DC | PRN

## 2015-07-24 NOTE — PMR Pre-admission (Signed)
PMR Admission Coordinator Pre-Admission Assessment  Patient: [name] is an 40 y.o., male MRN: [id number] DOB: 1975/11/01 Height:  (188 cm) Weight: (!) 155.584 kg (343 lb)              Insurance Information HMO:     PPO:      PCP:      IPA:      80/20:      OTHER:  PRIMARY: [employer] [place]      Policy#: [id number]      Subscriber: Self CM Name: Larose Kells     Phone#: [phone]   Fax#: [phone] Pre-Cert#: [id number] authorization 07/24/15-07/31/15      Employer: Not employed  Benefits:  Phone #: [phone]     Name: verified online Eff. Date: 05/10/15     Deduct: $3500.00      Out of Pocket Max: $7150.00      Life Max: N/A CIR: 70%/30%      SNF: 70%/30% Outpatient: PT/OT/SLP     Co-Pay: $40, 30 combined visits Home Health: 70%/30% DME: 70%/30% Providers: in network   Medicaid Application Date:       Case Manager:  Disability Application Date:       Case Worker:   Emergency Conservator, museum/gallery Information    Name Relation Home Work Mobile   [name] Spouse [phone]  [phone]     Current Medical History  Patient Admitting Diagnosis: 40 yo obese male s/p Left THA  History of Present Illness: [name] D [name] is a 40 y.o. right handed male with diabetes mellitus and morbid obesity, 343 pounds. Patient lives with his wife and was Independent prior to admission using a walker and cane. One level apartment with level entry. Family can assist as needed. Presented 07/21/2015 with functional deficits secondary to left hip pain due to severe osteoarthritis. No change with conservative care. Underwent left total hip arthroplasty anterior approach 07/21/2015 per Dr. Magnus Ivan. Weightbearing as tolerated with anterior hip precautions. Hospital course complicated by pain management. Acute blood loss anemia 10.2 and monitored. Presently on aspirin 325 mg twice a day for DVT prophylaxis. Physical therapy evaluation completed with recommendations  of physical medicine rehabilitation consult. Patient was admitted for comprehensive rehabilitation program.     Past Medical History  Past Medical History  Diagnosis Date  . Hypertension   . Diabetes mellitus without complication (HCC)   . Arthritis   . Anemia   . Anginal pain (HCC)   . Sleep apnea     wore CPAP prior to tonsillectomy  . Asthma     as a child  . H/O transfusion of packed red blood cells     with prior lumbar fusions x2    Family History  family history is not on file.  Prior Rehab/Hospitalizations:  Has the patient had major surgery during 100 days prior to admission? No  Current Medications   Current facility-administered medications:  .  0.9 %  sodium chloride infusion, , Intravenous, Continuous, Kathryne Hitch, MD, Last Rate: 75 mL/hr at 07/21/15 1655 .  acetaminophen (TYLENOL) tablet 650 mg, 650 mg, Oral, Q6H PRN, 650 mg at 07/22/15 2231 **OR** acetaminophen (TYLENOL) suppository 650 mg, 650 mg, Rectal, Q6H PRN, Kathryne Hitch, MD .  alum & mag hydroxide-simeth (MAALOX/MYLANTA) 200-200-20 MG/5ML suspension 30 mL, 30 mL, Oral, Q4H PRN, Kathryne Hitch, MD .  aspirin EC tablet 325 mg, 325 mg, Oral, BID PC, Kathryne Hitch, MD, 325 mg at 07/24/15  30 .  canagliflozin (INVOKANA) tablet 100 mg, 100 mg, Oral, QAC breakfast, Kathryne Hitch, MD, 100 mg at 07/24/15 0855 .  diphenhydrAMINE (BENADRYL) 12.5 MG/5ML elixir 12.5-25 mg, 12.5-25 mg, Oral, Q4H PRN, Kathryne Hitch, MD .  docusate sodium (COLACE) capsule 100 mg, 100 mg, Oral, BID, Kathryne Hitch, MD, 100 mg at 07/24/15 0855 .  HYDROmorphone (DILAUDID) injection 1-2 mg, 1-2 mg, Intravenous, Q2H PRN, Kathryne Hitch, MD, 1 mg at 07/23/15 0404 .  insulin aspart (novoLOG) injection 0-20 Units, 0-20 Units, Subcutaneous, TID WC, Kathryne Hitch, MD, 4 Units at 07/24/15 1237 .  insulin aspart (novoLOG) injection 0-5 Units, 0-5 Units, Subcutaneous,  QHS, Kathryne Hitch, MD, 2 Units at 07/23/15 2249 .  menthol-cetylpyridinium (CEPACOL) lozenge 3 mg, 1 lozenge, Oral, PRN, 3 mg at 07/22/15 0806 **OR** phenol (CHLORASEPTIC) mouth spray 1 spray, 1 spray, Mouth/Throat, PRN, Kathryne Hitch, MD .  methocarbamol (ROBAXIN) tablet 500 mg, 500 mg, Oral, Q6H PRN, 500 mg at 07/23/15 0957 **OR** methocarbamol (ROBAXIN) 500 mg in dextrose 5 % 50 mL IVPB, 500 mg, Intravenous, Q6H PRN, Kathryne Hitch, MD .  metoCLOPramide (REGLAN) tablet 5-10 mg, 5-10 mg, Oral, Q8H PRN **OR** metoCLOPramide (REGLAN) injection 5-10 mg, 5-10 mg, Intravenous, Q8H PRN, Kathryne Hitch, MD .  ondansetron [facility]) tablet 4 mg, 4 mg, Oral, Q6H PRN **OR** ondansetron (ZOFRAN) injection 4 mg, 4 mg, Intravenous, Q6H PRN, Kathryne Hitch, MD .  oxyCODONE (Oxy IR/ROXICODONE) immediate release tablet 5-15 mg, 5-15 mg, Oral, Q3H PRN, Kathryne Hitch, MD, 15 mg at 07/24/15 1237 .  triamterene-hydrochlorothiazide (MAXZIDE) 75-50 MG per tablet 1 tablet, 1 tablet, Oral, Daily, Kathryne Hitch, MD, 1 tablet at 07/24/15 0855  Patients Current Diet: Diet Carb Modified Fluid consistency:: Thin; Room service appropriate?: Yes  Precautions / Restrictions Precautions Precautions: Fall Precaution Comments: direct anterior, no precautions Restrictions Weight Bearing Restrictions: No LLE Weight Bearing: Weight bearing as tolerated   Has the patient had 2 or more falls or a fall with injury in the past year?No  Prior Activity Level Limited Community (1-2x/wk): Patient was disabled PTA, drove to aquatic class 3 days a week and medical appointments as needed.  Home Assistive Devices / Equipment Home Assistive Devices/Equipment: Environmental consultant (specify type), Cane (specify quad or straight), Tub transfer bench, Grab bars around toilet, CBG Meter Home Equipment: Bedside commode, Walker - 4 wheels, Cane - single point, Tub bench  Prior Device Use: Indicate  devices/aids used by the patient prior to current illness, exacerbation or injury? Walker and [employer]  Prior Functional Level Prior Function Level of Independence: Independent with assistive device(s) Comments: has SPC for mobility  Self Care: Did the patient need help bathing, dressing, using the toilet or eating?  Needed some help  Indoor Mobility: Did the patient need assistance with walking from room to room (with or without device)? Independent with devices  Stairs: Did the patient need assistance with internal or external stairs (with or without device)? Needed some help  Functional Cognition: Did the patient need help planning regular tasks such as shopping or remembering to take medications? Independent  Current Functional Level Cognition  Overall Cognitive Status: Within Functional Limits for tasks assessed Orientation Level: Oriented X4    Extremity Assessment (includes Sensation/Coordination)  Upper Extremity Assessment: Overall WFL for tasks assessed  Lower Extremity Assessment: LLE deficits/detail LLE Deficits / Details: Has pain with decreased mobiltiy on LLE and is not tolerating hip flexion or WBing on LLE well LLE  Coordination: decreased gross motor    ADLs  Overall ADL's : Needs assistance/impaired Eating/Feeding: Set up, Sitting Grooming: Set up, Bed level Upper Body Bathing: Minimal assitance, Sitting Lower Body Bathing: Moderate assistance, Sit to/from stand Upper Body Dressing : Minimal assistance, Sitting Lower Body Dressing: Maximal assistance, Sit to/from stand Toilet Transfer: +2 for physical assistance, +2 for safety/equipment, Maximal assistance, RW Toilet Transfer Details (indicate cue type and reason): simulated EOB to recliner. Pt using SPC and RW per his request  Functional mobility during ADLs: Maximal assistance, Rolling walker, Cueing for sequencing, Cueing for safety, +2 for safety/equipment, +2 for physical assistance (+SPC) General ADL  Comments: Pt limited by increased pain and generalized weakness.     Mobility  Overal bed mobility: + 2 for safety/equipment Bed Mobility: Supine to Sit Supine to sit: Mod assist, +2 for physical assistance General bed mobility comments: heavy use of rail for lifting trunk, PT assist with L leg and pt moved R leg    Transfers  Overall transfer level: Needs assistance Equipment used: Straight cane Transfers: Sit to/from Stand Sit to Stand: Mod assist, From elevated surface Stand pivot transfers: Mod assist, +2 physical assistance, +2 safety/equipment (initiated with SPC then transitioned to walker) General transfer comment: used SPC and bed rail to push up to stand, lifting assist also provided    Ambulation / Gait / Stairs / Wheelchair Mobility  Ambulation/Gait Ambulation/Gait assistance: Min assist, +2 safety/equipment Ambulation Distance (Feet): 80 Feet Assistive device: Rolling walker (2 wheeled) Gait Pattern/deviations: Antalgic, Decreased stride length, Wide base of support, Shuffle, Step-to pattern General Gait Details: initial difficutly progressing R LE , c/o pain walking without shoes on due to bunion pain, multiple stops to rest with pt fatigued and sweaty throughout and with great effort to move forward, switched walkers halfway through due to bariatric walker being delivered  Cues for upright posture and adjusted walker for height  Took 30 minutes to walk    Posture / Balance Balance Overall balance assessment: Needs assistance Sitting-balance support: Feet supported Sitting balance-Leahy Scale: Good Postural control: Posterior lean Standing balance support: Bilateral upper extremity supported Standing balance-Leahy Scale: Poor Standing balance comment: heavy UE support needed    Special needs/care consideration BiPAP/CPAP: No CPM: No Continuous Drip IV: No Dialysis: No         Life Vest: No Oxygen: No Special Bed: No Trach Size: No Wound Vac (area): No        Skin: WDL except for surgical incision site  Bowel mgmt: 07/19/15 Bladder mgmt: Continent  Diabetic mgmt: Yes, oral medications    Previous Home Environment Living Arrangements: Spouse/significant other Available Help at Discharge: Family, Available PRN/intermittently Type of Home: Apartment Home Layout: One level Home Access: Level entry Bathroom Shower/Tub: Tub/shower unit, Engineer, building servicesCurtain Bathroom Toilet: Handicapped height Bathroom Accessibility: Yes Home Care Services: Yes  Discharge Living Setting Plans for Discharge Living Setting: Patient's home Type of Home at Discharge: Apartment (handicap apartment per pt.) Discharge Home Layout: One level Discharge Bathroom Shower/Tub: Tub/shower unit, Curtain (had tub bench and grab bars) Discharge Bathroom Toilet: Handicapped height Discharge Bathroom Accessibility: Yes How Accessible: Accessible via walker Does the patient have any problems obtaining your medications?: Yes (Describe) (pt reports some meds are costly)  Social/Family/Support Systems Patient Roles: Spouse Anticipated Caregiver: Spouse: Martarash Anticipated Caregiver's Contact Information: [phone] Ability/Limitations of Caregiver: Spouse works M-F and is gone 12 hours at a time.  Parents and friends can provide intermittent while wife is at work.  Caregiver Availability: Evenings only  Discharge Plan Discussed with Primary Caregiver: Yes Is Caregiver In Agreement with Plan?: Yes Does Caregiver/Family have Issues with Lodging/Transportation while Pt is in Rehab?: No  Goals/Additional Needs Patient/Family Goal for Rehab: PT/OT Mod I and Supervision  Expected length of stay: 7-11 days  Cultural Considerations: None Dietary Needs: Carb Mod  Equipment Needs: TBD Special Service Needs: None Additional Information: Patient would like additional information on programs to support the cost of medications. Pt/Family Agrees to Admission and willing to participate:  Yes Program Orientation Provided & Reviewed with Pt/Caregiver Including Roles  & Responsibilities: Yes Additional Information Needs: See above  Information Needs to be Provided By: Social Worker to follow up   Decrease burden of Care through IP rehab admission: None  Possible need for SNF placement upon discharge: No  Patient Condition: This patient's condition remains as documented in the consult dated 07/23/15, in which the Rehabilitation Physician determined and documented that the patient's condition is appropriate for intensive rehabilitative care in an inpatient rehabilitation facility. Will admit to inpatient rehab today.  Preadmission Screen Completed By:  Fae Pippin, 07/24/2015 1:37 PM ______________________________________________________________________   Discussed status with Dr. Riley Kill on 07/24/15 at 1415 and received telephone approval for admission today.  Admission Coordinator:  Fae Pippin, time 1415/Date 07/24/15

## 2015-07-24 NOTE — NC FL2 (Signed)
[place] MEDICAID FL2 LEVEL OF CARE SCREENING TOOL     IDENTIFICATION  Patient Name: [name] Birthdate: 1976/05/06 Sex: male Admission Date (Current Location): 07/21/2015  [facility] and [place] Number:  Producer, television/film/video and Address:  [facility], [address] [place], [place] [address]      Provider Number: [id number]  Attending Physician Name and Address:  Kathryne Hitch, *  Relative Name and Phone Number:  Lerry Paterson, spouse, [phone]    Current Level of Care: Hospital Recommended Level of Care: Skilled Nursing Facility Prior Approval Number:    Date Approved/Denied:   PASRR Number: [id number]  Discharge Plan: SNF    Current Diagnoses: Patient Active Problem List   Diagnosis Date Noted  . Osteoarthritis of left hip 07/21/2015  . Status post total replacement of left hip 07/21/2015  . Lumbar stenosis with neurogenic claudication 01/14/2014  . Spinal stenosis, lumbar region, with neurogenic claudication 07/30/2012    Orientation RESPIRATION BLADDER Height & Weight     Self, Time, Situation, Place  Normal Continent Weight: (!) 155.584 kg (343 lb) Height:   (188 cm)  BEHAVIORAL SYMPTOMS/MOOD NEUROLOGICAL BOWEL NUTRITION STATUS      Continent  (Please see DC summary)  AMBULATORY STATUS COMMUNICATION OF NEEDS Skin   Limited Assist Verbally Surgical wounds (Closed incision on left hip and back)                       Personal Care Assistance Level of Assistance  Bathing, Feeding, Dressing Bathing Assistance: Limited assistance Feeding assistance: Independent Dressing Assistance: Limited assistance     Functional Limitations Info             SPECIAL CARE FACTORS FREQUENCY  PT (By licensed PT), OT (By licensed OT)     PT Frequency: 7x/week OT Frequency: min 3x/week            Contractures      Additional Factors Info  Code Status, Allergies, Insulin Sliding Scale Code Status Info:  Full Allergies Info: Bee Venom   Insulin Sliding Scale Info: insulin aspart (novoLOG) injection 0-20 Units; insulin aspart (novoLOG) injection 0-5 Units       Current Medications (07/24/2015):  This is the current hospital active medication list Current Facility-Administered Medications  Medication Dose Route Frequency Provider Last Rate Last Dose  . 0.9 %  sodium chloride infusion   Intravenous Continuous Kathryne Hitch, MD 75 mL/hr at 07/21/15 1655    . acetaminophen (TYLENOL) tablet 650 mg  650 mg Oral Q6H PRN Kathryne Hitch, MD   650 mg at 07/22/15 2231   Or  . acetaminophen (TYLENOL) suppository 650 mg  650 mg Rectal Q6H PRN Kathryne Hitch, MD      . alum & mag hydroxide-simeth (MAALOX/MYLANTA) 200-200-20 MG/5ML suspension 30 mL  30 mL Oral Q4H PRN Kathryne Hitch, MD      . aspirin EC tablet 325 mg  325 mg Oral BID PC Kathryne Hitch, MD   325 mg at 07/23/15 1759  . canagliflozin (INVOKANA) tablet 100 mg  100 mg Oral QAC breakfast Kathryne Hitch, MD   100 mg at 07/23/15 0924  . diphenhydrAMINE (BENADRYL) 12.5 MG/5ML elixir 12.5-25 mg  12.5-25 mg Oral Q4H PRN Kathryne Hitch, MD      . docusate sodium (COLACE) capsule 100 mg  100 mg Oral BID Kathryne Hitch, MD   100 mg at 07/23/15 2239  .  HYDROmorphone (DILAUDID) injection 1-2 mg  1-2 mg Intravenous Q2H PRN Kathryne Hitchhristopher Y Blackman, MD   1 mg at 07/23/15 0404  . insulin aspart (novoLOG) injection 0-20 Units  0-20 Units Subcutaneous TID WC Kathryne Hitchhristopher Y Blackman, MD   4 Units at 07/23/15 1758  . insulin aspart (novoLOG) injection 0-5 Units  0-5 Units Subcutaneous QHS Kathryne Hitchhristopher Y Blackman, MD   2 Units at 07/23/15 2249  . menthol-cetylpyridinium (CEPACOL) lozenge 3 mg  1 lozenge Oral PRN Kathryne Hitchhristopher Y Blackman, MD   3 mg at 07/22/15 [id number]   Or  . phenol (CHLORASEPTIC) mouth spray 1 spray  1 spray Mouth/Throat PRN Kathryne Hitchhristopher Y Blackman, MD      . methocarbamol (ROBAXIN) tablet 500  mg  500 mg Oral Q6H PRN Kathryne Hitchhristopher Y Blackman, MD   500 mg at 07/23/15 0957   Or  . methocarbamol (ROBAXIN) 500 mg in dextrose 5 % 50 mL IVPB  500 mg Intravenous Q6H PRN Kathryne Hitchhristopher Y Blackman, MD      . metoCLOPramide (REGLAN) tablet 5-10 mg  5-10 mg Oral Q8H PRN Kathryne Hitchhristopher Y Blackman, MD       Or  . metoCLOPramide (REGLAN) injection 5-10 mg  5-10 mg Intravenous Q8H PRN Kathryne Hitchhristopher Y Blackman, MD      . ondansetron [facility](ZOFRAN) tablet 4 mg  4 mg Oral Q6H PRN Kathryne Hitchhristopher Y Blackman, MD       Or  . ondansetron [facility](ZOFRAN) injection 4 mg  4 mg Intravenous Q6H PRN Kathryne Hitchhristopher Y Blackman, MD      . oxyCODONE (Oxy IR/ROXICODONE) immediate release tablet 5-15 mg  5-15 mg Oral Q3H PRN Kathryne Hitchhristopher Y Blackman, MD   15 mg at 07/24/15 0541  . triamterene-hydrochlorothiazide (MAXZIDE) 75-50 MG per tablet 1 tablet  1 tablet Oral Daily Kathryne Hitchhristopher Y Blackman, MD   1 tablet at 07/23/15 [id number]     Discharge Medications: Please see discharge summary for a list of discharge medications.  Relevant Imaging Results:  Relevant Lab Results:   Additional Information SSN: [id number]  Micaiah Remillard S KathrynRayyan, [place]

## 2015-07-24 NOTE — Discharge Summary (Signed)
Patient ID: [name] MRN: [id number] DOB/AGE: September 05, 1975 40 y.o.  Admit date: 07/21/2015 Discharge date: 07/24/2015  Admission Diagnoses:  Principal Problem:   Osteoarthritis of left hip Active Problems:   Status post total replacement of left hip   Discharge Diagnoses:  Same  Past Medical History  Diagnosis Date  . Hypertension   . Diabetes mellitus without complication (HCC)   . Arthritis   . Anemia   . Anginal pain (HCC)   . Sleep apnea     wore CPAP prior to tonsillectomy  . Asthma     as a child  . H/O transfusion of packed red blood cells     with prior lumbar fusions x2    Surgeries: Procedure(s): LEFT TOTAL HIP ARTHROPLASTY ANTERIOR APPROACH on 07/21/2015   Consultants:    Discharged Condition: Improved  Hospital Course: [name] is an 40 y.o. male who was admitted 07/21/2015 for operative treatment ofOsteoarthritis of left hip. Patient has severe unremitting pain that affects sleep, daily activities, and work/hobbies. After pre-op clearance the patient was taken to the operating room on 07/21/2015 and underwent  Procedure(s): LEFT TOTAL HIP ARTHROPLASTY ANTERIOR APPROACH.    Patient was given perioperative antibiotics: Anti-infectives    Start     Dose/Rate Route Frequency Ordered Stop   07/21/15 1700  ceFAZolin (ANCEF) IVPB 2 g/50 mL premix     2 g 100 mL/hr over 30 Minutes Intravenous Every 6 hours 07/21/15 1647 07/21/15 2305   07/21/15 0830  ceFAZolin (ANCEF) 3 g in dextrose 5 % 50 mL IVPB     3 g 130 mL/hr over 30 Minutes Intravenous To [facility] 07/20/15 1350 07/21/15 0931       Patient was given sequential compression devices, early ambulation, and chemoprophylaxis to prevent DVT.  Patient benefited maximally from hospital stay and there were no complications.    Recent vital signs: Patient Vitals for the past 24 hrs:  BP Temp Temp src Pulse Resp SpO2  07/24/15 0521 118/66 mmHg 98.9 F (37.2 C) Oral (!) 121 20 92 %   07/23/15 2152 125/71 mmHg 99.5 F (37.5 C) - (!) 127 18 95 %  07/23/15 1441 112/64 mmHg 99.5 F (37.5 C) Oral (!) 127 20 95 %     Recent laboratory studies:  Recent Labs  07/22/15 0549 07/23/15 0637  WBC 10.6* 10.9*  HGB 10.2* 10.1*  HCT 31.0* 31.1*  PLT 304 314  NA 137  --   K 3.6  --   CL 99*  --   CO2 26  --   BUN 18  --   CREATININE 1.01  --   GLUCOSE 170*  --   CALCIUM 8.0*  --      Discharge Medications:     Medication List    STOP taking these medications        diclofenac 75 MG EC tablet  Commonly known as:  VOLTAREN     indomethacin 50 MG capsule  Commonly known as:  INDOCIN     metaxalone 800 MG tablet  Commonly known as:  SKELAXIN     tiZANidine 4 MG tablet  Commonly known as:  ZANAFLEX      TAKE these medications        aspirin 325 MG EC tablet  Take 1 tablet (325 mg total) by mouth 2 (two) times daily after a meal.     celecoxib 200 MG capsule  Commonly known as:  CELEBREX  Take 200 mg by mouth  2 (two) times daily.     cyclobenzaprine 10 MG tablet  Commonly known as:  FLEXERIL  Take 10 mg by mouth 3 (three) times daily.     EPIPEN 2-PAK 0.3 mg/0.3 mL Soaj injection  Generic drug:  EPINEPHrine  Inject 0.3 mg into your thigh or buttocks after a bee sting and then call 911     INVOKANA 100 MG Tabs tablet  Generic drug:  canagliflozin  Take 100 mg by mouth daily before breakfast.     lidocaine 5 % ointment  Commonly known as:  XYLOCAINE  Apply 1 application topically 4 (four) times daily as needed for mild pain or moderate pain.     Naftifine HCl 2 % Crea  Commonly known as:  NAFTIN  Apply to affected area twice daily     OVER THE COUNTER MEDICATION  Take 2 capsules by mouth 2 (two) times daily. B Caps     oxyCODONE-acetaminophen 5-325 MG tablet  Commonly known as:  PERCOCET/ROXICET  Take 1-2 tablets by mouth every 6 (six) hours as needed for severe pain.     triamterene-hydrochlorothiazide 75-50 MG tablet  Commonly known  as:  MAXZIDE  Take 1 tablet by mouth daily.        Diagnostic Studies: Dg Hip Port Unilat With Pelvis 1v Left  07/21/2015  CLINICAL DATA:  Postop for left hip arthroplasty. EXAM: DG HIP (WITH OR WITHOUT PELVIS) 1V PORT LEFT COMPARISON:  Intraoperative films of earlier today. FINDINGS: AP and lateral views. The cross-table laterals are suboptimal secondary to patient body habitus. Given this factor, expected appearance of left hip arthroplasty, without acute hardware complication or periprosthetic fracture. Moderate to severe, significantly age advanced right hip osteoarthritis. Marked decreased joint space with subchondral cyst formation. IMPRESSION: Expected appearance after left hip arthroplasty. Moderate-to-marked right hip osteoarthritis. Electronically Signed   By: Jeronimo GreavesKyle  Talbot M.D.   On: 07/21/2015 12:07   Dg Hip Operative Unilat With Pelvis Left  07/21/2015  CLINICAL DATA:  Left hip replacement EXAM: OPERATIVE LEFT HIP (WITH PELVIS IF PERFORMED) two VIEWS TECHNIQUE: Fluoroscopic spot image(s) were submitted for interpretation post-operatively. COMPARISON:  None. FINDINGS: Intraoperative spot imaging demonstrates changes of left hip replacement. Normal AP alignment. No hardware or bony complicating feature noted. IMPRESSION: Left hip replacement.  No visible complicating feature. Electronically Signed   By: Charlett NoseKevin  Dover M.D.   On: 07/21/2015 10:47    Disposition: Inpatient Rehab       Discharge Instructions    Discharge patient    Complete by:  As directed            Follow-up Information    Follow up with Kathryne HitchBLACKMAN,Tedi Hughson Y, MD. Schedule an appointment as soon as possible for a visit in 2 weeks.   Specialty:  Orthopedic Surgery   Contact information:   [address] [place] [address] [phone]        Signed: Kathryne HitchBLACKMAN,Ezzard Ditmer Y 07/24/2015, 2:36 PM

## 2015-07-24 NOTE — H&P (View-Only) (Signed)
Physical Medicine and Rehabilitation Admission H&P    Chief complaint: Hip pain  HPI: [name] is a 40 y.o. right handed male with diabetes mellitus, morbid obesity 343 pounds. Patient lives with his wife. Independent prior to admission using a walker and cane. One level apartment with level entry. Family can assist as needed. Presented 07/21/2015 with functional deficits secondary to left hip pain due to severe osteoarthritis. No change with conservative care. Underwent left total hip arthroplasty anterior approach 07/21/2015 per Dr. Magnus IvanBlackman. Weightbearing as tolerated with anterior hip precautions. Hospital course pain management. Acute blood loss anemia 10.2 and monitored. Presently on aspirin 325 mg twice a day for DVT prophylaxis. Physical therapy evaluation completed with recommendations of physical medicine rehabilitation consult. Patient was admitted for comprehensive rehabilitation program  ROS Constitutional: Negative for fever and chills.  Eyes: Negative for blurred vision and double vision.  Respiratory: Negative for cough and shortness of breath.  Cardiovascular: Negative for chest pain and palpitations.  Gastrointestinal: Positive for constipation. Negative for nausea and vomiting.  Genitourinary: Negative for dysuria and hematuria.  Musculoskeletal: Positive for myalgias and joint pain.  Skin: Negative for rash.  Neurological: Negative for seizures, loss of consciousness and headaches.  All other systems reviewed and are negative   Past Medical History  Diagnosis Date  . Hypertension   . Diabetes mellitus without complication (HCC)   . Arthritis   . Anemia   . Anginal pain (HCC)   . Sleep apnea     wore CPAP prior to tonsillectomy  . Asthma     as a child  . H/O transfusion of packed red blood cells     with prior lumbar fusions x2   Past Surgical History  Procedure Laterality Date  . Tonsillectomy  2005     for sleep apnea  . Wisdom tooth extraction     . Back surgery      lumbar fusion  . Total hip arthroplasty Left 07/21/2015    Procedure: LEFT TOTAL HIP ARTHROPLASTY ANTERIOR APPROACH;  Surgeon: Kathryne Hitchhristopher Y Blackman, MD;  [facility];  Service: Orthopedics;  Laterality: Left;   History reviewed. No pertinent family history. Social History:  reports that he quit smoking about 3 years ago. His smoking use included Cigarettes. He has a 10 pack-year smoking history. He does not have any smokeless tobacco history on file. He reports that he does not drink alcohol or use illicit drugs. Allergies:  Allergies  Allergen Reactions  . Bee Venom Anaphylaxis, Swelling and Other (See Comments)    Swelling primarily at sting site, but can be all over.  Epi pen prn   Medications Prior to Admission  Medication Sig Dispense Refill  . aspirin EC 81 MG tablet Take 81 mg by mouth daily.     . canagliflozin (INVOKANA) 100 MG TABS tablet Take 100 mg by mouth daily before breakfast.    . celecoxib (CELEBREX) 200 MG capsule Take 200 mg by mouth 2 (two) times daily.  6  . cyclobenzaprine (FLEXERIL) 10 MG tablet Take 10 mg by mouth 3 (three) times daily.  2  . diclofenac (VOLTAREN) 75 MG EC tablet Take 75 mg by mouth 2 (two) times daily.    Marland Kitchen. EPINEPHrine (EPIPEN 2-PAK) 0.3 mg/0.3 mL IJ SOAJ injection Inject 0.3 mg into your thigh or buttocks after a bee sting and then call 911    . OVER THE COUNTER MEDICATION Take 2 capsules by mouth 2 (two) times daily. B Caps    .  oxyCODONE-acetaminophen (PERCOCET/ROXICET) 5-325 MG per tablet Take 1-2 tablets by mouth every 6 (six) hours as needed for severe pain. 90 tablet 0  . tiZANidine (ZANAFLEX) 4 MG tablet Take 4 mg by mouth 2 (two) times daily.  0  . triamterene-hydrochlorothiazide (MAXZIDE) 75-50 MG per tablet Take 1 tablet by mouth daily.    . indomethacin (INDOCIN) 50 MG capsule Take 1 capsule (50 mg total) by mouth 2 (two) times daily with a meal. (Patient not taking: Reported on 07/09/2015) 50 capsule 1  .  lidocaine (XYLOCAINE) 5 % ointment Apply 1 application topically 4 (four) times daily as needed for mild pain or moderate pain.    . metaxalone (SKELAXIN) 800 MG tablet Take 1 tablet (800 mg total) by mouth 3 (three) times daily. (Patient not taking: Reported on 07/09/2015) 60 tablet 1  . Naftifine HCl (NAFTIN) 2 % CREA Apply to affected area twice daily (Patient not taking: Reported on 07/09/2015) 60 g 3    Home: Home Living Family/patient expects to be discharged to:: Private residence Living Arrangements: Spouse/significant other Available Help at Discharge: Family, Available PRN/intermittently Type of Home: Apartment Home Access: Level entry Home Layout: One level Bathroom Shower/Tub: Tub/shower unit, Engineer, building services: Handicapped height Bathroom Accessibility: Yes Home Equipment: Bedside commode, Environmental consultant - 4 wheels, Cane - single point, Tub bench   Functional History: Prior Function Level of Independence: Independent with assistive device(s) Comments: has SPC for mobility  Functional Status:  Mobility: Bed Mobility Overal bed mobility: + 2 for safety/equipment Bed Mobility: Supine to Sit Supine to sit: Mod assist, +2 for physical assistance General bed mobility comments: heavy use of rail for lifting trunk, PT assist with L leg and pt moved R leg Transfers Overall transfer level: Needs assistance Equipment used: Straight cane Transfers: Sit to/from Stand Sit to Stand: Mod assist, From elevated surface Stand pivot transfers: Mod assist, +2 physical assistance, +2 safety/equipment (initiated with SPC then transitioned to walker) General transfer comment: used SPC and bed rail to push up to stand, lifting assist also provided Ambulation/Gait Ambulation/Gait assistance: Min assist, +2 safety/equipment Ambulation Distance (Feet): 80 Feet Assistive device: Rolling walker (2 wheeled) Gait Pattern/deviations: Antalgic, Decreased stride length, Wide base of support, Shuffle,  Step-to pattern General Gait Details: initial difficutly progressing R LE , c/o pain walking without shoes on due to bunion pain, multiple stops to rest with pt fatigued and sweaty throughout and with great effort to move forward, switched walkers halfway through due to bariatric walker being delivered  Cues for upright posture and adjusted walker for height  Took 30 minutes to walk    ADL: ADL Overall ADL's : Needs assistance/impaired Eating/Feeding: Set up, Sitting Grooming: Set up, Bed level Upper Body Bathing: Minimal assitance, Sitting Lower Body Bathing: Moderate assistance, Sit to/from stand Upper Body Dressing : Minimal assistance, Sitting Lower Body Dressing: Maximal assistance, Sit to/from stand Toilet Transfer: +2 for physical assistance, +2 for safety/equipment, Maximal assistance, RW Toilet Transfer Details (indicate cue type and reason): simulated EOB to recliner. Pt using SPC and RW per his request  Functional mobility during ADLs: Maximal assistance, Rolling walker, Cueing for sequencing, Cueing for safety, +2 for safety/equipment, +2 for physical assistance (+SPC) General ADL Comments: Pt limited by increased pain and generalized weakness.   Cognition: Cognition Overall Cognitive Status: Within Functional Limits for tasks assessed Orientation Level: Oriented X4 Cognition Arousal/Alertness: Awake/alert Behavior During Therapy: WFL for tasks assessed/performed Overall Cognitive Status: Within Functional Limits for tasks assessed  Physical Exam: Blood pressure  110/80, pulse 120, temperature 99.1 F (37.3 C), temperature source Oral, resp. rate 18, height  (1.88 m), weight 155.584 kg (343 lb), SpO2 91 %. Physical Exam Constitutional: He is oriented to person, place, and time.  40 year old right-handed morbidly obese male  HENT: oral mucosa pink and moist Head: Normocephalic.  Eyes: EOM are normal.  Neck: Normal range of motion. Neck supple. No thyromegaly  present.  Cardiovascular: Normal rate and regular rhythm.  Respiratory: Effort normal and breath sounds normal. No respiratory distress.  GI: Soft. Bowel sounds are normal. He exhibits no distension.  Musculoskeletal:  Left hip limited ROM due to pain. Continued tenderness with AROM/PROM Right hip. bilateral MTP's are less tender with palpation. No redness or warmth seen in the feet.  Neurological: He is alert and oriented to person, place, and time.  5/5 UE. LLE 1/5 HF, ke and 3/5 adf/pf. RLE: 2/5 hf, k3 and 3/5 adf/pf. No sensory deficits.  Skin:  Hip incision is dressed, clean with minimal drainage  Psychiatric: He has a normal mood and affect. His behavior is normal   Results for orders placed or performed during the hospital encounter of 07/21/15 (from the past 48 hour(s))  Glucose, capillary     Status: Abnormal   Collection Time: 07/22/15  5:46 PM  Result Value Ref Range   Glucose-Capillary 207 (H) 65 - 99 mg/dL  Glucose, capillary     Status: Abnormal   Collection Time: 07/22/15 10:32 PM  Result Value Ref Range   Glucose-Capillary 145 (H) 65 - 99 mg/dL  CBC     Status: Abnormal   Collection Time: 07/23/15  6:37 AM  Result Value Ref Range   WBC 10.9 (H) 4.0 - 10.5 K/uL   RBC 3.34 (L) 4.22 - 5.81 MIL/uL   Hemoglobin 10.1 (L) 13.0 - 17.0 g/dL   HCT 16.1 (L) 09.6 - 04.5 %   MCV 93.1 78.0 - 100.0 fL   MCH 30.2 26.0 - 34.0 pg   MCHC 32.5 30.0 - 36.0 g/dL   RDW 40.9 81.1 - 91.4 %   Platelets 314 150 - 400 K/uL  Glucose, capillary     Status: Abnormal   Collection Time: 07/23/15  8:09 AM  Result Value Ref Range   Glucose-Capillary 145 (H) 65 - 99 mg/dL  Glucose, capillary     Status: Abnormal   Collection Time: 07/23/15 11:57 AM  Result Value Ref Range   Glucose-Capillary 168 (H) 65 - 99 mg/dL  Glucose, capillary     Status: Abnormal   Collection Time: 07/23/15  5:13 PM  Result Value Ref Range   Glucose-Capillary 174 (H) 65 - 99 mg/dL  Glucose, capillary     Status:  Abnormal   Collection Time: 07/23/15 10:10 PM  Result Value Ref Range   Glucose-Capillary 210 (H) 65 - 99 mg/dL  Glucose, capillary     Status: Abnormal   Collection Time: 07/24/15  8:08 AM  Result Value Ref Range   Glucose-Capillary 155 (H) 65 - 99 mg/dL  Glucose, capillary     Status: Abnormal   Collection Time: 07/24/15 11:47 AM  Result Value Ref Range   Glucose-Capillary 180 (H) 65 - 99 mg/dL   No results found.     Medical Problem List and Plan: 1.  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  -begin CIR  - Weightbearing as tolerated LLE with anterior hip precautions 2.  DVT Prophylaxis/Anticoagulation: Aspirin 325  mg twice a day. Check vascular study on admit. 3. Pain Management: Oxycodone and Robaxin as needed. 4. Acute blood loss anemia. Follow-up CBC 5. Neuropsych: This patient is capable of making decisions on his own behalf. 6. Skin/Wound Care: Routine skin checks 7. Fluids/Electrolytes/Nutrition: Routine I&O with follow-up chemistries upon admit. 8. Hypertension. Maxzide 1 tablet daily. Monitor with increased mobility 9. Diabetes mellitus of peripheral neuropathy. Hemoglobin A1c 6.6.Invokana 100 mg daily. Check blood sugars before meals and at bedtime. Diabetic teaching 10. Morbid obesity. Dietary follow-up   Post Admission Physician Evaluation: 1. Functional deficits secondary  to endstage OA left hip s/p left THA. 2. Patient is admitted to receive collaborative, interdisciplinary care between the physiatrist, rehab nursing staff, and therapy team. 3. Patient's level of medical complexity and substantial therapy needs in context of that medical necessity cannot be provided at a lesser intensity of care such as a SNF. 4. Patient has experienced substantial functional loss from his/her baseline which was documented above under the "Functional History" and "Functional Status"  headings.  Judging by the patient's diagnosis, physical exam, and functional history, the patient has potential for functional progress which will result in measurable gains while on inpatient rehab.  These gains will be of substantial and practical use upon discharge  in facilitating mobility and self-care at the household level. 5. Physiatrist will provide 24 hour management of medical needs as well as oversight of the therapy plan/treatment and provide guidance as appropriate regarding the interaction of the two. 6. 24 hour rehab nursing will assist with bladder management, bowel management, safety, skin/wound care, disease management, medication administration, pain management and patient education  and help integrate therapy concepts, techniques,education, etc. 7. PT will assess and treat for/with: Lower extremity strength, range of motion, stamina, balance, functional mobility, safety, adaptive techniques and equipment, pain mgt, hip precautions, commuity reintegration, ego support.   Goals are: mod I. 8. OT will assess and treat for/with: ADL's, functional mobility, safety, upper extremity strength, adaptive techniques and equipment, pain control, hip precautions, ego support, community reintegration.   Goals are: mod I. Therapy may proceed with showering this patient if hip incision is covered.  9. SLP will assess and treat for/with: n/a.  Goals are: n/a. 10. Case Management and Social Worker will assess and treat for psychological issues and discharge planning. 11. Team conference will be held weekly to assess progress toward goals and to determine barriers to discharge. 12. Patient will receive at least 3 hours of therapy per day at least 5 days per week. 13. ELOS: 8-12 days       14. Prognosis:  excellent     Ranelle Oyster, MD, [facility] Physical Medicine & Rehabilitation 07/24/2015

## 2015-07-24 NOTE — Progress Notes (Signed)
Pharmacist transitions of care counseling completed.  Pt was educated on his use of multiple NSAIDs at home which can increase risk of renal dysfunction, GI bleeding, and CV risk.  Pt demonstrated understanding.    Hazle NordmannKelsy Combs, PharmD Pharmacy Resident [phone]

## 2015-07-24 NOTE — Progress Notes (Signed)
Patient ID: [name], male   DOB: 05/07/1976, 40 y.o.   MRN: [id number] Doing well overall.  Awaiting notification from insurance for acceptance to Inpatient Rehab.  Stable overall and can go to rehab today if accepted.  Otherwise, will need to consult with social work for short-term skilled nursing placement

## 2015-07-24 NOTE — Interval H&P Note (Signed)
[name] was admitted today to Inpatient Rehabilitation with the diagnosis of debility after left total hip replacement.  The patient's history has been reviewed, patient examined, and there is no change in status.  Patient continues to be appropriate for intensive inpatient rehabilitation.  I have reviewed the patient's chart and labs.  Questions were answered to the patient's satisfaction. The PAPE has been reviewed and assessment remains appropriate.  SWARTZ,ZACHARY T 07/24/2015, 6:57 PM

## 2015-07-24 NOTE — Discharge Instructions (Signed)

## 2015-07-24 NOTE — Care Management Note (Signed)
Case Management Note  Patient Details  Name: Madelin RearDarius D Olmo MRN: [id number] Date of Birth: 10/17/1975  Subjective/Objective:                 Independent patient from home, admitted w/ ORIF hip. [facility]    Action/Plan:  Patient will DC to CIR, [facility] updated on [facility] plan  Expected Discharge Date:                  Expected Discharge Plan:  IP Rehab Facility  In-House Referral:     Discharge planning Services  CM Consult  Post Acute Care Choice:  Home Health Choice offered to:  Patient  DME Arranged:  [name] Agency:  [facility]  [facility]  Status of Service:  Completed, signed off  Medicare Important Message Given:    Date Medicare IM Given:    Medicare IM give by:    Date Additional Medicare IM Given:    Additional Medicare Important Message give by:     If discussed at Long Length of Stay Meetings, dates discussed:    Additional Comments:  Lawerance SabalDebbie Alvaretta Eisenberger, RN 07/24/2015, 4:11 PM

## 2015-07-24 NOTE — Progress Notes (Signed)
Pt's HR=127;Dr.Blackman was notified ;no further orders received.

## 2015-07-24 NOTE — Progress Notes (Signed)
Inpatient Rehabilitation  Following up after my co-worker, who spoke with patient and wife yesterday.  They are interested in IP Rehab, case was opened, and today we await insurance authorization as well as bed availability.  Note per physician's note that patient is ready for rehab today.  Will follow up this afternoon regarding insurance and bed availability.  Note that CSW is concurrently seeking bed offers.  Please call with questions.   Charlane FerrettiMelissa Cattleya Dobratz, M.A., CCC/SLP Admission Coordinator  [facility]  Cell [phone]

## 2015-07-24 NOTE — Progress Notes (Signed)
Inpatient Rehabilitation  Received insurance authorization for admission to IP Rehab and have a bed available for today.  Notified MD, patient, RN CM, and CSW.  Plan for admit later today.    Charlane FerrettiMelissa Kam Rahimi, M.A., CCC/SLP Admission Coordinator  [facility]  Cell [phone]

## 2015-07-24 NOTE — Progress Notes (Signed)
   07/24/15 1620  PT Visit Information  Last PT Received On 07/24/15  Reason Eval/Treat Not Completed Other (comment) (Pt d/c to CIR today, nurse giving report.)

## 2015-07-24 NOTE — Progress Notes (Signed)
Patient ID: Nicholas RearDarius D Erby, male   DOB: 07/25/1975, 40 y.o.   MRN: [id number] Patient and family arrived from 5W via bed. Oriented to room, rehab process, rehab schedule, fall prevention plan, rehab safety plan, health resource notebook, and nurse call system. Patient agreeable to Flu vaccination.

## 2015-07-25 ENCOUNTER — Inpatient Hospital Stay (HOSPITAL_COMMUNITY): Payer: BLUE CROSS/BLUE SHIELD | Admitting: Occupational Therapy

## 2015-07-25 ENCOUNTER — Inpatient Hospital Stay (HOSPITAL_COMMUNITY): Payer: BLUE CROSS/BLUE SHIELD | Admitting: Physical Therapy

## 2015-07-25 ENCOUNTER — Inpatient Hospital Stay (HOSPITAL_COMMUNITY): Payer: BLUE CROSS/BLUE SHIELD

## 2015-07-25 DIAGNOSIS — R609 Edema, unspecified: Secondary | ICD-10-CM

## 2015-07-25 LAB — GLUCOSE, CAPILLARY
GLUCOSE-CAPILLARY: 190 mg/dL — AB (ref 65–99)
GLUCOSE-CAPILLARY: 179 mg/dL — AB (ref 65–99)
Glucose-Capillary: 170 mg/dL — ABNORMAL HIGH (ref 65–99)
Glucose-Capillary: 214 mg/dL — ABNORMAL HIGH (ref 65–99)

## 2015-07-25 MED ORDER — INFLUENZA VAC SPLIT QUAD 0.5 ML IM SUSY: 0.5000 mL | PREFILLED_SYRINGE | INTRAMUSCULAR | Status: DC

## 2015-07-25 MED ORDER — BISACODYL 10 MG RE SUPP
10.0000 mg | Freq: Once | RECTAL | Status: AC
  Administered 2015-07-25: 10 mg via RECTAL
  Filled 2015-07-25: qty 1

## 2015-07-25 MED ORDER — MENTHOL 3 MG MT LOZG
1.0000 | LOZENGE | OROMUCOSAL | Status: DC | PRN
  Filled 2015-07-25: qty 9

## 2015-07-25 MED ORDER — MAGNESIUM CITRATE PO SOLN
1.0000 | Freq: Once | ORAL | Status: AC
  Administered 2015-07-26: 1 via ORAL
  Filled 2015-07-25 (×2): qty 296

## 2015-07-25 MED ORDER — DIPHENHYDRAMINE HCL 25 MG PO CAPS: 25.0000 mg | ORAL_CAPSULE | Freq: Four times a day (QID) | ORAL | Status: DC | PRN

## 2015-07-25 NOTE — Progress Notes (Signed)
Pt temp running slightly high overnight (100.8). 98.4 at noon. Pt states does not feel any symptoms. Rescheduled Flu vaccine for tomorrow.

## 2015-07-25 NOTE — Progress Notes (Signed)
VASCULAR LAB PRELIMINARY  PRELIMINARY  PRELIMINARY  PRELIMINARY  Bilateral lower extremity venous duplex completed.     Bilateral:  No evidence of DVT, superficial thrombosis, or Baker's Cyst.   Jenetta Logesami Shanvi Moyd, RVT, RDMS 07/25/2015, 5:51 PM

## 2015-07-25 NOTE — Progress Notes (Signed)
Patient noted with low grade temps throughout nigh;t see flowsheet. Patient also observed for heart rate in the 120's throughout the night. Patient denies shortness of breath or chest pain. Left hip incision with surgical dressing intact. Continue with plan of care. Cleotilde NeerJoyce, Zeeshan Korte S

## 2015-07-25 NOTE — Progress Notes (Signed)
[facility] PHYSICAL MEDICINE & REHABILITATION     PROGRESS NOTE    Subjective/Complaints: Complained of itching last night. Was a bit warm in bed. Denies any sob, change in pain levels, fever/chills. Did have low grade temp  ROS: Pt denies fever, rash , headache, blurred or double vision, nausea, vomiting, abdominal pain, diarrhea, chest pain, shortness of breath, palpitations, dysuria, dizziness, neck or back pain, bleeding, anxiety, or depression  Objective: Vital Signs: Blood pressure 133/80, pulse 129, temperature 99.4 F (37.4 C), temperature source Oral, resp. rate 18, height  (1.88 m), SpO2 94 %. No results found.  Recent Labs  07/23/15 0637  WBC 10.9*  HGB 10.1*  HCT 31.1*  PLT 314   No results for input(s): NA, K, CL, GLUCOSE, BUN, CREATININE, CALCIUM in the last 72 hours.  Invalid input(s): CO CBG (last 3)   Recent Labs  07/24/15 1853 07/24/15 2131 07/25/15 0643  GLUCAP 144* 199* 170*    Wt Readings from Last 3 Encounters:  07/21/15 155.584 kg (343 lb)  07/14/15 137.44 kg (303 lb)  01/30/14 159.666 kg (352 lb)    Physical Exam:  Constitutional: He is oriented to person, place, and time.  40 year old right-handed morbidly obese male  HENT: oral mucosa pink and moist Head: Normocephalic.  Eyes: EOM are normal.  Neck: Normal range of motion. Neck supple. No thyromegaly present.  Cardiovascular: Normal rate and regular rhythm.  Respiratory: Effort normal and breath sounds normal. No respiratory distress.  GI: Soft. Bowel sounds are normal. He exhibits no distension.  Musculoskeletal:  Left hip limited ROM due to pain. Continued tenderness with AROM/PROM Right hip. bilateral MTP's are less tender with palpation. No redness or warmth seen in the feet.  Neurological: He is alert and oriented to person, place, and time.  5/5 UE. LLE 1/5 HF, ke and 3/5 adf/pf. RLE: 2/5 hf, k3 and 3/5 adf/pf. No sensory deficits.  Skin:  Hip incision is  dressed, clean without redness, drainage or warmth.  Psychiatric: He has a normal mood and affect. His behavior is normal   Assessment/Plan: 1. Functional and mobility deficits secondary to OA left hip, left THA which require 3+ hours per day of interdisciplinary therapy in a comprehensive inpatient rehab setting. Physiatrist is providing close team supervision and 24 hour management of active medical problems listed below. Physiatrist and rehab team continue to assess barriers to discharge/monitor patient progress toward functional and medical goals.  Function:  Bathing Bathing position      Bathing parts      Bathing assist        Upper Body Dressing/Undressing Upper body dressing                    Upper body assist        Lower Body Dressing/Undressing Lower body dressing                                  Lower body assist        Toileting Toileting          Toileting assist     Transfers Chair/bed transfer             Locomotion Ambulation           Wheelchair          Cognition Comprehension Comprehension assist level: Follows complex conversation/direction with no assist  Expression Expression assist level: Expresses  complex ideas: With extra time/assistive device  Social Interaction Social Interaction assist level: Interacts appropriately with others - No medications needed.  Problem Solving Problem solving assist level: Solves complex problems: With extra time  Memory Memory assist level: More than reasonable amount of time   Medical Problem List and Plan: 1. 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 -begin CIR therapies. - Weightbearing as tolerated LLE with anterior hip precautions 2. DVT Prophylaxis/Anticoagulation: Aspirin 325 mg twice a day. Check vascular study  3. Pain Management: Oxycodone  and Robaxin as needed. 4. Acute blood loss anemia. Follow-up CBC sunday 5. Neuropsych: This patient is capable of making decisions on his own behalf. 6. Skin/Wound Care: Routine skin checks 7. Fluids/Electrolytes/Nutrition: Routine I&O with follow-up chemistries. 8. Hypertension. Maxzide 1 tablet daily. Monitor with increased mobility 9. Diabetes mellitus of peripheral neuropathy. Hemoglobin A1c 6.6.Invokana 100 mg daily. Check blood sugars before meals and at bedtime. Diabetic teaching 10. Morbid obesity. Dietary follow-up 11. Low grade temp. Wound looks appropriate  -check dopplers as above.  -encourage OOB  -check labs in the AM 12. Tachycardia  -likely related to ABLA, perhaps volume depletion  -continue to monitor.   -check labs in AM, encourage fluids   LOS (Days) 1 A FACE TO FACE EVALUATION WAS PERFORMED  SWARTZ,ZACHARY T 07/25/2015 11:20 AM

## 2015-07-25 NOTE — Plan of Care (Signed)
Problem: RH BOWEL ELIMINATION Goal: RH STG MANAGE BOWEL WITH ASSISTANCE STG Manage Bowel with min Assistance.  Outcome: Not Progressing 2 doses sorbitol, no bowel movement yet

## 2015-07-25 NOTE — Progress Notes (Signed)
Physical Therapy Session Note  Patient Details  Name: [name] MRN: [id number] Date of Birth: 10/25/1975  Today's Date: 07/25/2015 PT Individual Time: 1445-1530 PT Individual Time Calculation (min): 45 min   Short Term Goals: Week 1:  PT Short Term Goal 1 (Week 1): mod I for bed mobility with rails PT Short Term Goal 2 (Week 1): S for transfers bed to chair, chair to bed PT Short Term Goal 3 (Week 1): S for ambulation with rolling walker 150 feet.  PT Short Term Goal 4 (Week 1): Ascend/descend 1curb with rolling walker and S PT Short Term Goal 5 (Week 1): ascend/descend 4 stairs with B rails and min A  Skilled Therapeutic Interventions/Progress Updates:  Pt was seen bedside in the pm. Pt transferred supine to edge of bed with side rail and min A with increased time. Pt transferred with from edge of bed to w/c with st cane, rolling walker, raised bed height and min guard with verbal cues. Pt propelled w/c about 10 feet with B UEs and S. Pt performed car transfer with min A and verbal cues utilized both st cane and rolling walker. Pt ambulated about 50 feet with rolling walker and S. Pt ascended/descended 1 step with B rails and min A. Pt returned to room. Pt transferred w/c to edge of bed with rolling walker and min guard. Pt transferred edge of bed to supine with side rail and min A with verbal cues. Pt left sitting up in bed with nurse at bedside.  Therapy Documentation Precautions:  Precautions Precautions: Fall Precaution Comments: direct anterior, no precautions Restrictions Weight Bearing Restrictions: Yes LLE Weight Bearing: Weight bearing as tolerated General:  Pain: Pt c/o 5/10 L hip.   See Function Navigator for Current Functional Status.   Therapy/Group: Individual Therapy  Rayford HalstedMitchell, Monty Mccarrell G 07/25/2015, 4:26 PM

## 2015-07-25 NOTE — Evaluation (Addendum)
Physical Therapy Assessment and Plan  Patient Details  Name: [name] MRN: [id number] Date of Birth: Apr 17, 1976  PT Diagnosis: Difficulty walking, Muscle weakness and Pain in joint Rehab Potential: Fair ELOS: 7 to 10 days   Today's Date: 07/25/2015 PT Individual Time: 0800-0915 PT Individual Time Calculation (min): 75 min    Problem List:  Patient Active Problem List   Diagnosis Date Noted  . Osteoarthritis of right hip 07/24/2015  . Morbid obesity ([place]) 07/24/2015  . Osteoarthritis of left hip 07/21/2015  . Status post total replacement of left hip 07/21/2015  . Lumbar stenosis with neurogenic claudication 01/14/2014  . Spinal stenosis, lumbar region, with neurogenic claudication 07/30/2012    Past Medical History:  Past Medical History  Diagnosis Date  . Hypertension   . Diabetes mellitus without complication ([place])   . Arthritis   . Anemia   . Anginal pain ([place])   . Sleep apnea     wore CPAP prior to tonsillectomy  . Asthma     as a child  . H/O transfusion of packed red blood cells     with prior lumbar fusions x2   Past Surgical History:  Past Surgical History  Procedure Laterality Date  . Tonsillectomy  2005     for sleep apnea  . Wisdom tooth extraction    . Back surgery      lumbar fusion  . Total hip arthroplasty Left 07/21/2015    Procedure: LEFT TOTAL HIP ARTHROPLASTY ANTERIOR APPROACH;  Surgeon: Mcarthur Rossetti, MD;  Location: [facility];  Service: Orthopedics;  Laterality: Left;    Assessment & Plan Clinical Impression: Patient is a 40 y.o. right handed male with diabetes mellitus, morbid obesity 343 pounds. Patient lives with his wife. Independent prior to admission using a walker and cane. One level apartment with level entry. Family can assist as needed. Presented 07/21/2015 with functional deficits secondary to left hip pain due to severe osteoarthritis. No change with conservative care. Underwent left total hip arthroplasty anterior approach  07/21/2015 per Dr. Ninfa Linden. Weightbearing as tolerated with anterior hip precautions. Hospital course pain management. Acute blood loss anemia 10.2 and monitored. Presently on aspirin 325 mg twice a day for DVT prophylaxis. Physical therapy evaluation completed with recommendations of physical medicine rehabilitation consult. Patient was admitted for comprehensive rehabilitation program. Patient transferred to CIR on 07/24/2015 .   Patient currently requires min with mobility secondary to muscle weakness and decreased cardiorespiratoy endurance.  Prior to hospitalization, patient was modified independent  with mobility and lived with Spouse in a [place] home.  Home access is  Level entry.  Patient will benefit from skilled PT intervention to maximize safe functional mobility, minimize fall risk and decrease caregiver burden for planned discharge home with intermittent assist.  Anticipate patient will benefit from follow up [facility] at discharge.  PT - End of Session Activity Tolerance: Tolerates 30+ min activity with multiple rests Endurance Deficit: Yes PT Assessment Rehab Potential (ACUTE/IP ONLY): Fair Barriers to Discharge: Decreased caregiver support PT Patient demonstrates impairments in the following area(s): Balance;Endurance PT Transfers Functional Problem(s): Bed Mobility;Bed to Chair;Car PT Locomotion Functional Problem(s): Ambulation;Stairs PT Plan PT Intensity: Minimum of 1-2 x/day ,45 to 90 minutes PT Frequency: 5 out of 7 days PT Duration Estimated Length of Stay: 7 to 10 days PT Treatment/Interventions: Ambulation/gait training;Balance/vestibular training;Community reintegration;Discharge planning;Functional mobility training;Functional electrical stimulation;DME/adaptive equipment instruction;Neuromuscular re-education;Patient/family education;Psychosocial support;Stair training;UE/LE Strength taining/ROM;Wheelchair propulsion/positioning;Therapeutic Activities;UE/LE Coordination  activities;Therapeutic Exercise PT Transfers Anticipated Outcome(s): mod I transfers  PT Locomotion Anticipated Outcome(s): mod I ambulation, S for stairs PT Recommendation Follow Up Recommendations: Home health PT Patient destination: Home Equipment Recommended: To be determined  Skilled Therapeutic Intervention PT evaluation completed and treatment plan initiated. Pt performed multiple transfers with min guard and verbal cues with increased time from w/c. Pt required multiple rest breaks due to decreased activity tolerance. Following evaluation and treatment pt returned to room and left sitting up in w/c with call bell within reach.   PT Evaluation Precautions/Restrictions Precautions Precautions: Fall Precaution Comments: direct anterior, no precautions Restrictions Weight Bearing Restrictions: Yes LLE Weight Bearing: Weight bearing as tolerated General Chart Reviewed: Yes Family/Caregiver Present: Yes  Pain Pt c/o pain 5/10 L hip.   Home Living/Prior Functioning Home Living Available Help at Discharge: Family;Available PRN/intermittently;Friend(s) Type of Home: Apartment Home Access: Level entry Home Layout: One level  Lives With: Spouse Prior Function Level of Independence: Independent with gait;Independent with transfers;Requires assistive device for independence  Able to Take Stairs?: Yes Driving: Yes Comments: utilized both a SPC and rolling walker, pt needs to have R hip replaced Vision/Perception As per OT evaluation. Cognition Overall Cognitive Status: Within Functional Limits for tasks assessed Orientation Level: Oriented X4 Memory: Appears intact Awareness: Appears intact Problem Solving: Appears intact Safety/Judgment: Appears intact Sensation Sensation Light Touch: Impaired Detail Light Touch Impaired Details: Impaired LLE;Impaired RLE Motor  Motor Motor: Within Functional Limits  Mobility Bed Mobility Bed Mobility: Rolling Right;Supine to  Sit Rolling Right: 4: Min assist;With rail Supine to Sit: 4: Mod assist;With rails Transfers Transfers: Yes Sit to Stand: 4: Min assist;From elevated surface Stand to Sit: 4: Min assist;To elevated surface Stand Pivot Transfers: 4: Min assist Locomotion  Ambulation Ambulation: Yes Ambulation/Gait Assistance: 5: Supervision Ambulation Distance (Feet): 125 Feet Assistive device: Rolling walker Stairs / Additional Locomotion Stairs: Yes Stairs Assistance: 4: Min guard Stair Management Technique: Two rails Number of Stairs: 1 Height of Stairs: 6 Wheelchair Mobility Wheelchair Mobility: Yes Wheelchair Assistance: 5: Careers information officer: Both upper extremities Distance: 125  Trunk/Postural Assessment  Cervical Assessment Cervical Assessment: Within Water engineer Thoracic Assessment: Within Functional Limits Lumbar Assessment Lumbar Assessment: Exceptions to [facility] Postural Control Postural Control: Within Functional Limits  Balance Balance Balance Assessed: Yes Static Sitting Balance Static Sitting - Balance Support: Feet unsupported Static Sitting - Level of Assistance: 6: Modified independent (Device/Increase time) Static Standing Balance Static Standing - Balance Support: During functional activity Static Standing - Level of Assistance: 5: Stand by assistance Dynamic Standing Balance Dynamic Standing - Balance Support: During functional activity Dynamic Standing - Level of Assistance: 5: Stand by assistance Extremity Assessment  B UEs as per OT evaluation.  RLE Assessment RLE Assessment: Exceptions to [facility] RLE PROM (degrees) Overall PROM Right Lower Extremity: Deficits;Due to pain RLE Overall PROM Comments: hip flex about 60 degrees, DF to neutral, Knee WFLs RLE Strength RLE Overall Strength: Deficits;Due to pain RLE Overall Strength Comments: hip flex 2+/5, knee 3/5, ankle 3/5 LLE Assessment LLE Assessment: Exceptions to  WFL LLE PROM (degrees) Overall PROM Left Lower Extremity: Within functional limits for tasks assessed LLE Strength LLE Overall Strength: Deficits LLE Overall Strength Comments: hip grossly 2+/5, knee 3/5, ankle 3/5   See Function Navigator for Current Functional Status.   Refer to Care Plan for Long Term Goals  Recommendations for other services: None  Discharge Criteria: Patient will be discharged from PT if patient refuses treatment 3 consecutive times without medical reason, if treatment goals not met, if there is  a change in medical status, if patient makes no progress towards goals or if patient is discharged from hospital.  The above assessment, treatment plan, treatment alternatives and goals were discussed and mutually agreed upon: by patient  Dub Amis 07/25/2015, 4:02 PM

## 2015-07-25 NOTE — Evaluation (Signed)
Occupational Therapy Assessment and Plan  Patient Details  Name: [name] MRN: [id number] Date of Birth: [birth year]  OT Diagnosis: muscle weakness (generalized) and pain in joint Rehab Potential: Rehab Potential (ACUTE ONLY): Good ELOS: 7-10 days   Today's Date: 07/25/2015 OT Individual Time: 1300-1415 OT Individual Time Calculation (min): 75 min     Problem List:  Patient Active Problem List   Diagnosis Date Noted  . Osteoarthritis of right hip 07/24/2015  . Morbid obesity ([place]) 07/24/2015  . Osteoarthritis of left hip 07/21/2015  . Status post total replacement of left hip 07/21/2015  . Lumbar stenosis with neurogenic claudication 01/14/2014  . Spinal stenosis, lumbar region, with neurogenic claudication 07/30/2012    Past Medical History:  Past Medical History  Diagnosis Date  . Hypertension   . Diabetes mellitus without complication ([place])   . Arthritis   . Anemia   . Anginal pain ([place])   . Sleep apnea     wore CPAP prior to tonsillectomy  . Asthma     as a child  . H/O transfusion of packed red blood cells     with prior lumbar fusions x2   Past Surgical History:  Past Surgical History  Procedure Laterality Date  . Tonsillectomy  2005     for sleep apnea  . Wisdom tooth extraction    . Back surgery      lumbar fusion  . Total hip arthroplasty Left 07/21/2015    Procedure: LEFT TOTAL HIP ARTHROPLASTY ANTERIOR APPROACH;  Surgeon: Mcarthur Rossetti, MD;  Location: [facility];  Service: Orthopedics;  Laterality: Left;    Assessment & Plan Clinical Impression: 40 y.o. right handed male with diabetes mellitus, morbid obesity 343 pounds. Patient lives with his wife. Independent prior to admission using a walker and cane. One level apartment with level entry. Family can assist as needed. Presented 07/21/2015 with functional deficits secondary to left hip pain due to severe osteoarthritis. No change with conservative care. Underwent left total hip arthroplasty  anterior approach 07/21/2015 per Dr. Ninfa Linden. Weightbearing as tolerated with anterior hip precautions. Hospital course pain management. Acute blood loss anemia 10.2 and monitored. Presently on aspirin 325 mg twice a day for DVT prophylaxis  Patient transferred to CIR on 07/24/2015 .    Patient currently requires mod with basic self-care skills and IADL secondary to muscle weakness and muscle joint tightness and decreased cardiorespiratoy endurance.  Prior to hospitalization, patient could complete BADL with independent .  Patient will benefit from skilled intervention to increase independence with basic self-care skills and increase level of independence with iADL prior to discharge home with care partner.  Anticipate patient will require intermittent supervision and follow up home health.  OT - End of Session Activity Tolerance: Tolerates 30+ min activity with multiple rests Endurance Deficit: Yes Endurance Deficit Description:  (Increased SOB with activity) OT Assessment Rehab Potential (ACUTE ONLY): Good Barriers to Discharge: Decreased caregiver support OT Patient demonstrates impairments in the following area(s): Balance;Endurance;Safety;Perception;Pain;Motor OT Basic ADL's Functional Problem(s): Grooming;Bathing;Dressing;Toileting OT Advanced ADL's Functional Problem(s): Simple Meal Preparation;Laundry;Light Housekeeping OT Transfers Functional Problem(s): Toilet;Tub/Shower OT Plan OT Intensity: Minimum of 1-2 x/day, 45 to 90 minutes OT Frequency: 5 out of 7 days OT Duration/Estimated Length of Stay: 7-10 days OT Treatment/Interventions: Teacher, English as a foreign language;Functional mobility training;Patient/family education;Self Care/advanced ADL retraining;Therapeutic Activities;Therapeutic Exercise;UE/LE Strength taining/ROM OT Basic Self-Care Anticipated Outcome(s): mod I OT Toileting Anticipated Outcome(s): mod I OT Bathroom Transfers Anticipated  Outcome(s): SBA with shower transfer OT Recommendation Patient  destination: Home Follow Up Recommendations: Home health OT Equipment Recommended: None recommended by OT   Skilled Therapeutic Intervention Pt engaged in therapeutic bathing and dressing at shower level.  Pt ambulated with RW to bathroom and transferred to shower with mod to min assist.  Educated pt on transfers, DME, and AE.  Pt lives in handicapped home with most DME present.  Ppt demonstrated good awareness and safety during ADL.  Minimal verbal cues needed for transfers.    OT Evaluation Precautions/Restrictions  Precautions Precautions: Fall Precaution Comments: direct anterior, no precautions Restrictions Weight Bearing Restrictions: Yes LLE Weight Bearing: Weight bearing as tolerated    Vital Signs Therapy Vitals Temp: 99.9 F (37.7 C) Temp Source: Oral Pulse Rate: (!) 125 Resp: 18 BP: 123/85 mmHg Patient Position (if appropriate): Sitting Oxygen Therapy SpO2: 94 % O2 Device: Not Delivered Pain Pain Assessment Pain Assessment: 0-10 Pain Score: 6  Pain Type: Surgical pain Pain Location: Hip Pain Orientation: Left Pain Descriptors / Indicators: Aching Pain Frequency: Intermittent Pain Onset: On-going Pain Intervention(s): Medication (See eMAR) Home Living/Prior Functioning Home Living Available Help at Discharge: Family, Available PRN/intermittently Type of Home: Apartment Home Access: Level entry Home Layout: One level Bathroom Shower/Tub: Tub/shower unit, Curtain (1 grab, tub transfer bench) Bathroom Toilet: Handicapped height Bathroom Accessibility: Yes  Lives With: Spouse IADL History Homemaking Responsibilities: Yes Meal Prep Responsibility: Primary Laundry Responsibility: Primary Bill Paying/Finance Responsibility: Secondary Shopping Responsibility: Secondary Child Care Responsibility: Primary Current License: Yes Mode of Transportation:  (truck) Occupation: On disability Prior  Function Level of Independence: Independent with basic ADLs, Independent with homemaking with ambulation, Independent with homemaking with wheelchair, Independent with gait, Independent with transfers  Able to Take Stairs?: Yes Driving: Yes Comments: has SPC for mobility ADL   Vision/Perception  Vision- History Baseline Vision/History: Wears glasses Wears Glasses: At all times Vision- Assessment Vision Assessment?: No apparent visual deficits Perception Perception: Within Functional Limits  Cognition Overall Cognitive Status: Within Functional Limits for tasks assessed Arousal/Alertness: Awake/alert Orientation Level: Person;Place;Situation Person: Oriented Situation: Oriented Year: 2017 Month: March Day of Week: Correct Memory: Appears intact Immediate Memory Recall: Sock;Blue;Bed Memory Recall: Sock;Blue;Bed Memory Recall Sock: Without Cue Memory Recall Blue: Without Cue Memory Recall Bed: Without Cue Attention: Selective Selective Attention: Appears intact Awareness: Appears intact Problem Solving: Appears intact Safety/Judgment: Appears intact Sensation Sensation Light Touch: Appears Intact (For BUE) Light Touch Impaired Details: Impaired LLE;Impaired RLE Coordination Gross Motor Movements are Fluid and Coordinated: Yes Fine Motor Movements are Fluid and Coordinated: Yes Motor  Motor Motor: Within Functional Limits Mobility  Bed Mobility Bed Mobility:  (SEE PT note) Rolling Right: 4: Min assist;With rail Supine to Sit: 4: Min assist;With rails Transfers Sit to Stand: 4: Min assist Sit to Stand Details: Manual facilitation for placement;Manual facilitation for weight bearing Sit to Stand Details (indicate cue type and reason): min assist Stand to Sit: 4: Min assist;With upper extremity assist Stand to Sit Details (indicate cue type and reason): Manual facilitation for weight bearing  Trunk/Postural Assessment  Cervical Assessment Cervical Assessment:  Within Functional Limits Thoracic Assessment Thoracic Assessment: Within Functional Limits Lumbar Assessment Lumbar Assessment: Exceptions to [facility] Postural Control Postural Control: Within Functional Limits  Balance Balance Balance Assessed: Yes Static Sitting Balance Static Sitting - Balance Support: Feet supported Static Sitting - Level of Assistance: 6: Modified independent (Device/Increase time) Static Standing Balance Static Standing - Balance Support: Right upper extremity supported Static Standing - Level of Assistance: 4: Min assist Dynamic Standing Balance Dynamic Standing - Balance  Support: During functional activity Dynamic Standing - Level of Assistance: 4: Min assist Extremity/Trunk Assessment RUE Assessment RUE Assessment: Within Functional Limits LUE Assessment LUE Assessment: Within Functional Limits   See Function Navigator for Current Functional Status.   Refer to Care Plan for Long Term Goals  Recommendations for other services: None  Discharge Criteria: Patient will be discharged from OT if patient refuses treatment 3 consecutive times without medical reason, if treatment goals not met, if there is a change in medical status, if patient makes no progress towards goals or if patient is discharged from hospital.  The above assessment, treatment plan, treatment alternatives and goals were discussed and mutually agreed upon: by patient  Lisa Roca 07/25/2015, 4:43 PM

## 2015-07-26 DIAGNOSIS — R7989 Other specified abnormal findings of blood chemistry: Secondary | ICD-10-CM

## 2015-07-26 DIAGNOSIS — D72829 Elevated white blood cell count, unspecified: Secondary | ICD-10-CM

## 2015-07-26 DIAGNOSIS — M1000 Idiopathic gout, unspecified site: Secondary | ICD-10-CM

## 2015-07-26 LAB — BASIC METABOLIC PANEL
Anion gap: 11 (ref 5–15)
BUN: 24 mg/dL — AB (ref 6–20)
CO2: 30 mmol/L (ref 22–32)
Calcium: 9 mg/dL (ref 8.9–10.3)
Chloride: 94 mmol/L — ABNORMAL LOW (ref 101–111)
Creatinine, Ser: 1.18 mg/dL (ref 0.61–1.24)
GFR calc Af Amer: >60 mL/min (ref >60)
GLUCOSE: 231 mg/dL — AB (ref 65–99)
POTASSIUM: 3.5 mmol/L (ref 3.5–5.1)
Sodium: 135 mmol/L (ref 135–145)

## 2015-07-26 LAB — URINALYSIS, ROUTINE W REFLEX MICROSCOPIC
Bilirubin Urine: NEGATIVE
HGB URINE DIPSTICK: NEGATIVE
Ketones, ur: NEGATIVE mg/dL
Leukocytes, UA: NEGATIVE
Nitrite: NEGATIVE
PH: 5.5 (ref 5.0–8.0)
PROTEIN: NEGATIVE mg/dL
SPECIFIC GRAVITY, URINE: 1.031 — AB (ref 1.005–1.030)

## 2015-07-26 LAB — URINE MICROSCOPIC-ADD ON
BACTERIA UA: NONE SEEN
RBC / HPF: NONE SEEN RBC/hpf (ref 0–5)
SQUAMOUS EPITHELIAL / LPF: NONE SEEN

## 2015-07-26 LAB — CBC
HEMATOCRIT: 31.1 % — AB (ref 39.0–52.0)
Hemoglobin: 9.9 g/dL — ABNORMAL LOW (ref 13.0–17.0)
MCH: 28.9 pg (ref 26.0–34.0)
MCHC: 31.8 g/dL (ref 30.0–36.0)
MCV: 90.7 fL (ref 78.0–100.0)
Platelets: 471 10*3/uL — ABNORMAL HIGH (ref 150–400)
RBC: 3.43 MIL/uL — ABNORMAL LOW (ref 4.22–5.81)
RDW: 13.1 % (ref 11.5–15.5)
WBC: 12.3 10*3/uL — ABNORMAL HIGH (ref 4.0–10.5)

## 2015-07-26 LAB — GLUCOSE, CAPILLARY
GLUCOSE-CAPILLARY: 191 mg/dL — AB (ref 65–99)
GLUCOSE-CAPILLARY: 186 mg/dL — AB (ref 65–99)
Glucose-Capillary: 177 mg/dL — ABNORMAL HIGH (ref 65–99)
Glucose-Capillary: 188 mg/dL — ABNORMAL HIGH (ref 65–99)

## 2015-07-26 LAB — URIC ACID: Uric Acid, Serum: 8.3 mg/dL — ABNORMAL HIGH (ref 4.4–7.6)

## 2015-07-26 MED ORDER — SENNOSIDES-DOCUSATE SODIUM 8.6-50 MG PO TABS
2.0000 | ORAL_TABLET | Freq: Every day | ORAL | Status: DC
  Administered 2015-07-26 – 2015-08-02 (×8): 2 via ORAL
  Filled 2015-07-26 (×8): qty 2

## 2015-07-26 MED ORDER — COLCHICINE 0.6 MG PO TABS
0.6000 mg | ORAL_TABLET | Freq: Two times a day (BID) | ORAL | Status: AC
  Administered 2015-07-26 (×2): 0.6 mg via ORAL
  Filled 2015-07-26 (×2): qty 1

## 2015-07-26 MED ORDER — INFLUENZA VAC SPLIT QUAD 0.5 ML IM SUSY
0.5000 mL | PREFILLED_SYRINGE | INTRAMUSCULAR | Status: AC
  Administered 2015-07-27: 0.5 mL via INTRAMUSCULAR
  Filled 2015-07-26: qty 0.5

## 2015-07-26 NOTE — Progress Notes (Signed)
Patient called RN to check his temperature. Patient was shivering when rn came in. Temp. Was 99.8, 111/60, 123 , 18. RN gave patient heating pack to warm up and covered him up with couple of blankets. Patient felt better. Pain medicine also given for pain 7/10 to his left hip. Rechecked patient and he was already asleep. Will monitor.

## 2015-07-26 NOTE — Progress Notes (Signed)
Patient  refused magnesium citrate tonight, he wants to take it in the morning.  BM x1 this evening post suppository given by day RN, watery stool.

## 2015-07-26 NOTE — Progress Notes (Signed)
[place] PHYSICAL MEDICINE & REHABILITATION     PROGRESS NOTE    Subjective/Complaints: Low grade temp yesterday. Had some shivering last night. Complains of both big toes hurting and knees hurting--thinks it might be gout--asked if he could have indocin.   ROS: Pt denies fever, rash , headache, blurred or double vision, nausea, vomiting, abdominal pain, diarrhea, chest pain, shortness of breath, palpitations, dysuria, dizziness, neck or back pain, bleeding, anxiety, or depression  Objective: Vital Signs: Blood pressure 119/75, pulse 122, temperature 99.9 F (37.7 C), temperature source Oral, resp. rate 18, height  (1.88 m), SpO2 94 %. No results found.  Recent Labs  07/26/15 0556  WBC 12.3*  HGB 9.9*  HCT 31.1*  PLT 471*    Recent Labs  07/26/15 0556  NA 135  K 3.5  CL 94*  GLUCOSE 231*  BUN 24*  CREATININE 1.18  CALCIUM 9.0   CBG (last 3)   Recent Labs  07/25/15 1638 07/25/15 2346 07/26/15 0645  GLUCAP 179* 214* 191*    Wt Readings from Last 3 Encounters:  07/21/15 155.584 kg (343 lb)  07/14/15 137.44 kg (303 lb)  01/30/14 159.666 kg (352 lb)    Physical Exam:  Constitutional: He is oriented to person, place, and time.  40 year old right-handed morbidly obese male  HENT: oral mucosa pink and moist Head: Normocephalic.  Eyes: EOM are normal.  Neck: Normal range of motion. Neck supple. No thyromegaly present.  Cardiovascular: Normal rate and regular rhythm.  Respiratory: Effort normal and breath sounds normal. No respiratory distress.  GI: Soft. Bowel sounds are normal. He exhibits no distension.  Musculoskeletal:  Left hip limited ROM due to pain. Continued tenderness with AROM/PROM Right hip. bilateral MTP's mildly tender with palpation. No redness or warmth seen in the feet---may have some swelling at MTP's. Knees are unremarkable Neurological: He is alert and oriented to person, place, and time.  5/5 UE. LLE 1/5 HF, ke and 3/5  adf/pf. RLE: 2/5 hf, k3 and 3/5 adf/pf. No sensory deficits.  Skin:  Hip incision is dressed, clean without redness, drainage or warmth.  Psychiatric: He has a normal mood and affect. His behavior is normal   Assessment/Plan: 1. Functional and mobility deficits secondary to OA left hip, left THA which require 3+ hours per day of interdisciplinary therapy in a comprehensive inpatient rehab setting. Physiatrist is providing close team supervision and 24 hour management of active medical problems listed below. Physiatrist and rehab team continue to assess barriers to discharge/monitor patient progress toward functional and medical goals.  Function:  Bathing Bathing position   Position: Shower  Bathing parts Body parts bathed by patient: Right arm, Left arm, Chest, Abdomen, Right upper leg, Left upper leg Body parts bathed by helper: Front perineal area, Buttocks, Right lower leg, Left lower leg, Back  Bathing assist Assist Level: Touching or steadying assistance(Pt > 75%)      Upper Body Dressing/Undressing Upper body dressing   What is the patient wearing?: Pull over shirt/dress     Pull over shirt/dress - Perfomed by patient: Thread/unthread right sleeve, Thread/unthread left sleeve, Put head through opening Pull over shirt/dress - Perfomed by helper: Pull shirt over trunk        Upper body assist Assist Level: Touching or steadying assistance(Pt > 75%)      Lower Body Dressing/Undressing Lower body dressing   What is the patient wearing?: Non-skid slipper socks, Pants       Pants- Performed by helper: Pull pants up/down,  Thread/unthread left pants leg, Thread/unthread right pants leg   Non-skid slipper socks- Performed by helper: Don/doff left sock, Don/doff right sock                  Lower body assist        Toileting Toileting   Toileting steps completed by patient: Adjust clothing prior to toileting, Adjust clothing after toileting Toileting steps  completed by helper: Performs perineal hygiene Toileting Assistive Devices: Grab bar or rail  Toileting assist     Transfers Chair/bed transfer   Chair/bed transfer method: Stand pivot Chair/bed transfer assist level: Touching or steadying assistance (Pt > 75%) Chair/bed transfer assistive device: Walker, Designer, fashion/clothingArmrests     Locomotion Ambulation     Max distance: 50 Assist level: Supervision or verbal cues   Wheelchair   Type: Manual Max wheelchair distance: 100 Assist Level: Supervision or verbal cues  Cognition Comprehension Comprehension assist level: Follows complex conversation/direction with no assist  Expression Expression assist level: Expresses complex ideas: With extra time/assistive device  Social Interaction Social Interaction assist level: Interacts appropriately with others - No medications needed.  Problem Solving Problem solving assist level: Solves complex problems: With extra time  Memory Memory assist level: More than reasonable amount of time   Medical Problem List and Plan: 1. 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 -begin CIR therapies. - Weightbearing as tolerated LLE with anterior hip precautions 2. DVT Prophylaxis/Anticoagulation: Aspirin 325 mg twice a day. Check vascular study  3. Pain Management: Oxycodone and Robaxin as needed. 4. Acute blood loss anemia. Follow-up CBC sunday 5. Neuropsych: This patient is capable of making decisions on his own behalf. 6. Skin/Wound Care: Routine skin checks 7. Fluids/Electrolytes/Nutrition: Routine I&O with follow-up chemistries. 8. Hypertension. Maxzide 1 tablet daily. Monitor with increased mobility 9. Diabetes mellitus of peripheral neuropathy. Hemoglobin A1c 6.6.Invokana 100 mg daily. Check blood sugars before meals and at bedtime. Diabetic teaching  -SSI at present 10. Morbid obesity.  Dietary follow-up 11. Low grade temp/leukocytosis (12.3). Wound looks appropriate, chest clear  -dopplers negative  -will check UA,cx  -recheck CBC in AM along with UA level  -could be reactive to gout flare---rx with colchicine today  -constipation related? 12. Tachycardia  -likely related to ABLA and volume depletion  -continue to monitor.   -check labs again in AM, and push fluids 13. Constipation  -SSE today  -change to senokot s   LOS (Days) 2 A FACE TO FACE EVALUATION WAS PERFORMED  Jahniyah Revere T 07/26/2015 10:12 AM

## 2015-07-27 ENCOUNTER — Inpatient Hospital Stay (HOSPITAL_COMMUNITY): Payer: BLUE CROSS/BLUE SHIELD | Admitting: Occupational Therapy

## 2015-07-27 ENCOUNTER — Inpatient Hospital Stay (HOSPITAL_COMMUNITY): Payer: BLUE CROSS/BLUE SHIELD

## 2015-07-27 ENCOUNTER — Inpatient Hospital Stay (HOSPITAL_COMMUNITY): Payer: Self-pay

## 2015-07-27 ENCOUNTER — Inpatient Hospital Stay (HOSPITAL_COMMUNITY): Payer: Self-pay | Admitting: Occupational Therapy

## 2015-07-27 LAB — BASIC METABOLIC PANEL
ANION GAP: 11 (ref 5–15)
BUN: 23 mg/dL — AB (ref 6–20)
CHLORIDE: 94 mmol/L — AB (ref 101–111)
CO2: 32 mmol/L (ref 22–32)
CREATININE: 1.05 mg/dL (ref 0.61–1.24)
Calcium: 8.9 mg/dL (ref 8.9–10.3)
GFR calc Af Amer: >60 mL/min (ref >60)
GFR calc non Af Amer: >60 mL/min (ref >60)
Glucose, Bld: 191 mg/dL — ABNORMAL HIGH (ref 65–99)
POTASSIUM: 3.3 mmol/L — AB (ref 3.5–5.1)
Sodium: 137 mmol/L (ref 135–145)

## 2015-07-27 LAB — CBC
HEMATOCRIT: 31.6 % — AB (ref 39.0–52.0)
HEMOGLOBIN: 10.2 g/dL — AB (ref 13.0–17.0)
MCH: 29.7 pg (ref 26.0–34.0)
MCHC: 32.3 g/dL (ref 30.0–36.0)
MCV: 91.9 fL (ref 78.0–100.0)
PLATELETS: 474 10*3/uL — AB (ref 150–400)
RBC: 3.44 MIL/uL — AB (ref 4.22–5.81)
RDW: 13.2 % (ref 11.5–15.5)
WBC: 10.5 10*3/uL (ref 4.0–10.5)

## 2015-07-27 LAB — URINE CULTURE

## 2015-07-27 LAB — GLUCOSE, CAPILLARY
GLUCOSE-CAPILLARY: 170 mg/dL — AB (ref 65–99)
GLUCOSE-CAPILLARY: 191 mg/dL — AB (ref 65–99)
GLUCOSE-CAPILLARY: 204 mg/dL — AB (ref 65–99)
Glucose-Capillary: 183 mg/dL — ABNORMAL HIGH (ref 65–99)

## 2015-07-27 MED ORDER — COLCHICINE 0.6 MG PO TABS
0.6000 mg | ORAL_TABLET | Freq: Two times a day (BID) | ORAL | Status: AC
  Administered 2015-07-27 (×2): 0.6 mg via ORAL
  Filled 2015-07-27 (×2): qty 1

## 2015-07-27 MED ORDER — POTASSIUM CHLORIDE CRYS ER 20 MEQ PO TBCR
20.0000 meq | EXTENDED_RELEASE_TABLET | Freq: Two times a day (BID) | ORAL | Status: AC
Start: 2015-07-27 — End: 2015-07-28
  Administered 2015-07-27 – 2015-07-28 (×4): 20 meq via ORAL
  Filled 2015-07-27 (×4): qty 1

## 2015-07-27 MED ORDER — BISACODYL 10 MG RE SUPP
10.0000 mg | Freq: Every day | RECTAL | Status: DC | PRN
  Administered 2015-07-27: 10 mg via RECTAL
  Filled 2015-07-27: qty 1

## 2015-07-27 MED ORDER — POLYETHYLENE GLYCOL 3350 17 G PO PACK
17.0000 g | PACK | Freq: Every day | ORAL | Status: DC
  Administered 2015-07-27 – 2015-08-03 (×8): 17 g via ORAL
  Filled 2015-07-27 (×8): qty 1

## 2015-07-27 NOTE — Plan of Care (Signed)
Problem: RH BOWEL ELIMINATION Goal: RH STG MANAGE BOWEL WITH ASSISTANCE STG Manage Bowel with min Assistance.  Outcome: Not Progressing No bowel movement after multiple interventions, KUB ordered

## 2015-07-27 NOTE — Progress Notes (Signed)
[place] PHYSICAL MEDICINE & REHABILITATION     PROGRESS NOTE    Subjective/Complaints: No temp. Pain better controlled.  Large bm yesterday. Toes feel better  ROS: Pt denies fever, rash , headache, blurred or double vision, nausea, vomiting, abdominal pain, diarrhea, chest pain, shortness of breath, palpitations, dysuria, dizziness, neck or back pain, bleeding, anxiety, or depression  Objective: Vital Signs: Blood pressure 112/76, pulse 120, temperature 98.5 F (36.9 C), temperature source Oral, resp. rate 18, height  (1.88 m), SpO2 95 %. No results found.  Recent Labs  07/26/15 0556 07/27/15 0641  WBC 12.3* 10.5  HGB 9.9* 10.2*  HCT 31.1* 31.6*  PLT 471* 474*    Recent Labs  07/26/15 0556  NA 135  K 3.5  CL 94*  GLUCOSE 231*  BUN 24*  CREATININE 1.18  CALCIUM 9.0   CBG (last 3)   Recent Labs  07/26/15 1122 07/26/15 1634 07/26/15 2055  GLUCAP 177* 186* 188*    Wt Readings from Last 3 Encounters:  07/21/15 155.584 kg (343 lb)  07/14/15 137.44 kg (303 lb)  01/30/14 159.666 kg (352 lb)    Physical Exam:  Constitutional: He is oriented to person, place, and time.  40 year old right-handed morbidly obese male  HENT: oral mucosa pink and moist Head: Normocephalic.  Eyes: EOM are normal.  Neck: Normal range of motion. Neck supple. No thyromegaly present.  Cardiovascular: Normal rate and regular rhythm.  Respiratory: Effort normal and breath sounds normal. No respiratory distress.  GI: Soft. Bowel sounds are normal. He exhibits no distension.  Musculoskeletal:  Left hip limited ROM due to pain. Continued tenderness with AROM/PROM Right hip. bilateral MTP's less tender with palpation. No redness or warmth seen in the feet---may have some swelling at MTP's. Knees are unremarkable Neurological: He is alert and oriented to person, place, and time.  5/5 UE. LLE 1/5 HF, ke and 3/5 adf/pf. RLE: 2/5 hf, k3 and 3/5 adf/pf. No sensory deficits.   Skin:  Hip incision is dressed, clean without redness, drainage or warmth.  Psychiatric: He has a normal mood and affect. His behavior is normal   Assessment/Plan: 1. Functional and mobility deficits secondary to OA left hip, left THA which require 3+ hours per day of interdisciplinary therapy in a comprehensive inpatient rehab setting. Physiatrist is providing close team supervision and 24 hour management of active medical problems listed below. Physiatrist and rehab team continue to assess barriers to discharge/monitor patient progress toward functional and medical goals.  Function:  Bathing Bathing position   Position: Shower  Bathing parts Body parts bathed by patient: Right arm, Left arm, Chest, Abdomen, Right upper leg, Left upper leg Body parts bathed by helper: Front perineal area, Buttocks, Right lower leg, Left lower leg, Back  Bathing assist Assist Level: Touching or steadying assistance(Pt > 75%)      Upper Body Dressing/Undressing Upper body dressing   What is the patient wearing?: Pull over shirt/dress     Pull over shirt/dress - Perfomed by patient: Thread/unthread right sleeve, Thread/unthread left sleeve, Put head through opening Pull over shirt/dress - Perfomed by helper: Pull shirt over trunk        Upper body assist Assist Level: Touching or steadying assistance(Pt > 75%)      Lower Body Dressing/Undressing Lower body dressing   What is the patient wearing?: Non-skid slipper socks, Pants       Pants- Performed by helper: Pull pants up/down, Thread/unthread left pants leg, Thread/unthread right pants leg  Non-skid slipper socks- Performed by helper: Don/doff left sock, Don/doff right sock                  Lower body assist        Toileting Toileting   Toileting steps completed by patient: Adjust clothing prior to toileting, Adjust clothing after toileting Toileting steps completed by helper: Performs perineal hygiene Toileting Assistive  Devices: Grab bar or rail  Toileting assist     Transfers Chair/bed transfer   Chair/bed transfer method: Stand pivot Chair/bed transfer assist level: Touching or steadying assistance (Pt > 75%) Chair/bed transfer assistive device: Walker, Designer, fashion/clothingArmrests     Locomotion Ambulation     Max distance: 50 Assist level: Supervision or verbal cues   Wheelchair   Type: Manual Max wheelchair distance: 100 Assist Level: Supervision or verbal cues  Cognition Comprehension Comprehension assist level: Follows complex conversation/direction with no assist  Expression Expression assist level: Expresses complex ideas: With extra time/assistive device  Social Interaction Social Interaction assist level: Interacts appropriately with others - No medications needed.  Problem Solving Problem solving assist level: Solves complex problems: With extra time  Memory Memory assist level: More than reasonable amount of time   Medical Problem List and Plan: 1. 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 -begin CIR therapies. - Weightbearing as tolerated LLE with anterior hip precautions 2. DVT Prophylaxis/Anticoagulation: Aspirin 325 mg twice a day. Check vascular study  3. Pain Management: Oxycodone and Robaxin as needed. 4. Acute blood loss anemia. Follow-up CBC sunday 5. Neuropsych: This patient is capable of making decisions on his own behalf. 6. Skin/Wound Care: Routine skin checks 7. Fluids/Electrolytes/Nutrition: Routine I&O with follow-up chemistries. 8. Hypertension. Maxzide 1 tablet daily. Monitor with increased mobility 9. Diabetes mellitus of peripheral neuropathy. Hemoglobin A1c 6.6.Invokana 100 mg daily. Check blood sugars before meals and at bedtime. Diabetic teaching  -SSI at present 10. Morbid obesity. Dietary follow-up 11. Low grade temp/leukocytosis. Wound looks  appropriate, chest clear  -dopplers negative  -ua neg, cx pending  -recheck CBC today  -could be reactive to gout flare---rx with colchicine again today. ua level 8.3  -constipation related? 12. Tachycardia  -likely related to ABLA and volume depletion  -continue to monitor.   -labs pending today. Pushing fluids 13. Constipation  -SSE with results  -changed to senokot s   LOS (Days) 3 A FACE TO FACE EVALUATION WAS PERFORMED  Oprah Camarena T 07/27/2015 7:21 AM

## 2015-07-27 NOTE — Progress Notes (Signed)
Patient information reviewed and entered into eRehab system by Thadeus Gandolfi, RN, CRRN, PPS Coordinator.  Information including medical coding and functional independence measure will be reviewed and updated through discharge.    

## 2015-07-27 NOTE — IPOC Note (Addendum)
Overall Plan of Care [facility](IPOC) Patient Details Name: [name] MRN: [id number] DOB: 09/25/1975  Admitting Diagnosis: THA  Hospital Problems: Principal Problem:   Status post total replacement of left hip Active Problems:   Osteoarthritis of right hip   Morbid obesity (HCC)     Functional Problem List: Nursing Bowel, Edema, Endurance, Medication Management, Pain, Safety, Skin Integrity  PT Balance, Endurance  OT Balance, Endurance, Safety, Perception, Pain, Motor  SLP    TR         Basic ADL's: OT Grooming, Bathing, Dressing, Toileting     Advanced  ADL's: OT Simple Meal Preparation, Laundry, Light Housekeeping     Transfers: PT Bed Mobility, Bed to Chair, Customer service managerCar  OT Toilet, Tub/Shower     Locomotion: PT Ambulation, Stairs     Additional Impairments: OT    SLP        TR      Anticipated Outcomes Item Anticipated Outcome  Self Feeding    Swallowing      Basic self-care  mod I  Toileting  mod I   Bathroom Transfers SBA with shower transfer  Bowel/Bladder  Supervision  Transfers  mod I transfers  Locomotion  mod I ambulation, S for stairs  Communication     Cognition     Pain  <4  Safety/Judgment  Mod I   Therapy Plan: PT Intensity: Minimum of 1-2 x/day ,45 to 90 minutes PT Frequency: 5 out of 7 days PT Duration Estimated Length of Stay: 7 to 10 days OT Intensity: Minimum of 1-2 x/day, 45 to 90 minutes OT Frequency: 5 out of 7 days OT Duration/Estimated Length of Stay: 7-10 days         Team Interventions: Nursing Interventions Patient/Family Education, Disease Management/Prevention, Pain Management, Medication Management, Skin Care/Wound Management, Discharge Planning, Psychosocial Support  PT interventions Ambulation/gait training, Warden/rangerBalance/vestibular training, Community reintegration, Discharge planning, Functional mobility training, Functional electrical stimulation, DME/adaptive equipment instruction, Neuromuscular re-education,  Patient/family education, Psychosocial support, Stair training, UE/LE Strength taining/ROM, Wheelchair propulsion/positioning, Therapeutic Activities, UE/LE Coordination activities, Therapeutic Exercise  OT Interventions Warden/rangerBalance/vestibular training, DME/adaptive equipment instruction, Functional mobility training, Patient/family education, Self Care/advanced ADL retraining, Therapeutic Activities, Therapeutic Exercise, UE/LE Strength taining/ROM  SLP Interventions    TR Interventions    SW/CM Interventions Discharge Planning, Psychosocial Support, Patient/Family Education    Team Discharge Planning: Destination: PT-Home ,OT- Home , SLP-  Projected Follow-up: PT-Home health PT, OT-  Home health OT, SLP-  Projected Equipment Needs: PT-To be determined, OT- None recommended by OT, SLP-  Equipment Details: PT- , OT-  Patient/family involved in discharge planning: PT- Patient,  OT-Patient, Family member/caregiver, SLP-   MD ELOS: 7-10 days Medical Rehab Prognosis:  Excellent Assessment: The patient has been admitted for CIR therapies with the diagnosis of left hip oa, s/p THA. The team will be addressing functional mobility, strength, stamina, balance, safety, adaptive techniques and equipment, self-care, bowel and bladder mgt, patient and caregiver education, ortho precautions, pain mgt, community reintegration, ego support. Goals have been set at mod I for mobility and self-care tasks.    Ranelle OysterZachary T. Swartz, MD, FAAPMR      See Team Conference Notes for weekly updates to the plan of care

## 2015-07-27 NOTE — Progress Notes (Signed)
Occupational Therapy Session Note  Patient Details  Name: [name] MRN: [id number] Date of Birth: [birth year]  Today's Date: 07/27/2015 OT Individual Time: 1300-1415 OT Individual Time Calculation (min): 75 min   Short Term Goals = Long Term Goals secondary to ELOS  Skilled Therapeutic Interventions/Progress Updates:  Pt received supine in bed, in chair like position, eating lunch. Pt reported pain as 6/10 to LLE; notified RN for scheduled pain medication after shower per patient's request. Pt engaged in bed mobility and sat EOB, HR=128 (which has been an average for him since this hospitalization). Pt stood with RW +assistance from [facility] and ambulated to w/c, seated beside sink. Pt performed grooming tasks seated at sink with set-up assist. Pt states he did not sit to perform grooming tasks PTA. After grooming tasks, pt ambulated into BR for shower stall transfer onto transfer bench. Therapist covered incision site and pt performed UB/LB bathing at a supervision level, using LH sponge to increase independence. Pt takes more than a reasonable amount of time to complete tasks, but does so safely and is able to direct & advocate for his care appropriately and prn. After bathing, pt stood to dry off. Pt ambulated back to room and sat in w/c. Pt left seated in w/c with all needs within reach. Notified RN of patient's where-a-bouts and RN to assist pt with dressing after toileting needs have been met.   Therapy Documentation Precautions:  Precautions Precautions: Fall Precaution Comments: direct anterior, no precautions Restrictions Weight Bearing Restrictions: Yes LLE Weight Bearing: Weight bearing as tolerated  See Function Navigator for Current Functional Status.  Therapy/Group: Individual Therapy  Chrys Racer , MS, OTR/L, CLT Pager: [phone]  07/27/2015, 3:06 PM

## 2015-07-27 NOTE — Progress Notes (Signed)
Occupational Therapy Session Note  Patient Details  Name: Madelin RearDarius D Liszewski MRN: [id number] Date of Birth: 01/23/1976  Today's Date: 07/27/2015 OT Individual Time: 1800-1830 OT Individual Time Calculation (min): 30 min     Skilled Therapeutic Interventions/Progress Updates: Patient eating upon approach for therapy.  Upon return to patient he concurred to work on triceps exercises (1.5 # each UE) and endurance exercises bed level.  Patient did require Min tactile cues for proper technique of the exercises. Patient required rest breaks after approximately 45 seconds of continued exercise activities but easily returned to exercises and rested when needed  His wife was present and straightening room during session.    She also brought his Adaptive Equipment from home as requested from staff.  Patient was left supine in bed with his wife and sister and call bell in place at the end of the session.     Therapy Documentation Precautions:  Precautions Precautions: Fall Precaution Comments: direct anterior, no precautions Restrictions Weight Bearing Restrictions: Yes LLE Weight Bearing: Weight bearing as tolerated  Pain:5/10 RN had given meds See Function Navigator for Current Functional Status.   Therapy/Group: Individual Therapy  Rozelle Loganickett, Maheen Cwikla Yeary 07/27/2015, 7:21 PM

## 2015-07-27 NOTE — Care Management Note (Signed)
[facility] Individual Statement of Services  Patient Name:  Madelin RearDarius D Lackman  Date:  07/27/2015  Welcome to the Inpatient Rehabilitation Center.  Our goal is to provide you with an individualized program based on your diagnosis and situation, designed to meet your specific needs.  With this comprehensive rehabilitation program, you will be expected to participate in at least 3 hours of rehabilitation therapies Monday-Friday, with modified therapy programming on the weekends.  Your rehabilitation program will include the following services:  Physical Therapy (PT), Occupational Therapy (OT), 24 hour per day rehabilitation nursing, Therapeutic Recreaction (TR), Case Management (Social Worker), Rehabilitation Medicine, Nutrition Services and Pharmacy Services  Weekly team conferences will be held on Tuesdays to discuss your progress.  Your Social Worker will talk with you frequently to get your input and to update you on team discussions.  Team conferences with you and your family in attendance may also be held.  Expected length of stay: 7-10 days  Overall anticipated outcome: modified independent  Depending on your progress and recovery, your program may change. Your Social Worker will coordinate services and will keep you informed of any changes. Your Social Worker's name and contact numbers are listed  below.  The following services may also be recommended but are not provided by the Inpatient Rehabilitation Center:   Driving Evaluations  Home Health Rehabiltiation Services  Outpatient Rehabilitation Services  Vocational Rehabilitation   Arrangements will be made to provide these services after discharge if needed.  Arrangements include referral to agencies that provide these services.  Your insurance has been verified to be:  [employer] [place] Your primary doctor is:  Toy Cookeyrnest Eason  Pertinent information will be shared with your doctor and your insurance company.  Social  Worker:  GalenaLucy Wahid [place], [place] [phone] or ([phone]) [phone]   Information discussed with and copy given to patient by: Amada JupiterHOYLE, [place] Godman, 07/27/2015, 4:22 PM

## 2015-07-27 NOTE — Progress Notes (Signed)
Physical Therapy Session Note  Patient Details  Name: Nicholas RearDarius D Zurn MRN: [id number] Date of Birth: 08/08/1975  Today's Date: 07/27/2015 PT Individual Time: 0830-0915 PT Individual Time Calculation (min): 45 min   Short Term Goals: Week 1:  PT Short Term Goal 1 (Week 1): mod I for bed mobility with rails PT Short Term Goal 2 (Week 1): S for transfers bed to chair, chair to bed PT Short Term Goal 3 (Week 1): S for ambulation with rolling walker 150 feet.  PT Short Term Goal 4 (Week 1): Ascend/descend 1curb with rolling walker and S PT Short Term Goal 5 (Week 1): ascend/descend 4 stairs with B rails and min A  Skilled Therapeutic Interventions/Progress Updates:    Patient seen for focus on ambulation and strengthening.  Seated EOB and assist to don shoes, then stood with min A and donned shorts with supervision.  Patient ambulated with Bariatric RW and supervision x 75' then propelled w/c another 100'.  Negotiated steps 4 6" and 8 3" steps with min A and bilateral rails alternating which foot to lead due to arthritic R hip and new THA L hip.  Patient seated EOB and moved to supine with min A and use of leg lifter.  Supine therex as below.  Supine to sit with lifter and min A for trunk from sidelying.  Patient ambulated back towards room 7675' S and propelled w/c remainder 100'.  [facility] and [facility] bell in reach and nursing aware.  Therapy Documentation Precautions:  Precautions Precautions: Fall Precaution Comments: direct anterior, no precautions Restrictions Weight Bearing Restrictions: Yes LLE Weight Bearing: Weight bearing as tolerated Pain: Pain Assessment Pain Assessment: 0-10 Pain Score: 6  Pain Type: Acute pain Pain Location: Hip Pain Orientation: Left Pain Descriptors / Indicators: Sore Pain Frequency: Constant Pain Onset: On-going Pain Intervention(s): Repositioned;Ambulation/increased activity Exercises: Total  Joint Exercises Short Arc Quad: Strengthening;Both;10 reps;Supine (2 sets; 1.5 # cuff weights) Hip ABduction/ADduction: Strengthening;Both;10 reps;Supine (adduction against ball between legs)  See Function Navigator for Current Functional Status.   Therapy/Group: Individual Therapy  Elray McgregorCynthia Olivette Beckmann  Lovellyndi Davonda Ausley, [place] CarolinaPT [phone] 07/27/2015  07/27/2015, 12:06 PM

## 2015-07-27 NOTE — Progress Notes (Signed)
Occupational Therapy Note  Patient Details  Name: [name] MRN: [id number] Date of Birth: 05/21/1975  Today's Date: 07/27/2015 OT Missed Time: 60 Minutes Missed Time Reason: Nursing care  Pt missed 60 mins skilled OT services.  Pt sitting on BSC upon arrival, stating he was attempting to have a bowel movement.  RN entered to assist. RN remained in room, attempting to facilitate bowel movement for patient.  OT checked back several times during scheduled therapy time but RN continued to be with patient.   [name], [name] 07/27/2015, 10:52 AM

## 2015-07-28 ENCOUNTER — Inpatient Hospital Stay (HOSPITAL_COMMUNITY): Payer: BLUE CROSS/BLUE SHIELD | Admitting: *Deleted

## 2015-07-28 ENCOUNTER — Inpatient Hospital Stay (HOSPITAL_COMMUNITY): Payer: BLUE CROSS/BLUE SHIELD | Admitting: Physical Therapy

## 2015-07-28 ENCOUNTER — Inpatient Hospital Stay (HOSPITAL_COMMUNITY): Payer: BLUE CROSS/BLUE SHIELD

## 2015-07-28 LAB — GLUCOSE, CAPILLARY
GLUCOSE-CAPILLARY: 169 mg/dL — AB (ref 65–99)
GLUCOSE-CAPILLARY: 191 mg/dL — AB (ref 65–99)
Glucose-Capillary: 124 mg/dL — ABNORMAL HIGH (ref 65–99)
Glucose-Capillary: 157 mg/dL — ABNORMAL HIGH (ref 65–99)

## 2015-07-28 NOTE — Progress Notes (Signed)
Occupational Therapy Session Note  Patient Details  Name: [name] MRN: [id number] Date of Birth: 04/22/1976  Today's Date: 07/28/2015 OT Individual Time: 1345-1430 OT Individual Time Calculation (min): 45 min make up time   Short Term Goals: Week 1:  OT Short Term Goal 1 (Week 1): STG=LTG secondary to short ELOS  Skilled Therapeutic Interventions/Progress Updates:    Pt seen for make up session with focus on LB dressing and bathroom transfers while adhering to anterior hip precautions.  Pt asleep in bed upon arrival, requiring increased time to fully arouse.  Required assist to advance LLE to EOB.  Engaged in LB dressing with focus on donning and doffing socks and shoes with use of AE.  Pt utilized reacher, long handled shoe horn and reacher to don socks and shoes with increased time, pt left shoes tied.  Ambulated approx 150 feet to ADL apt with RW with one standing break.  Problem solved tub/shower transfer with discussion of home bathroom setup and equipment.  Pt reports having tub bench and multiple grab bars.  Pt able to step over tub ledge with use of grab bar and slowly lowered self onto tub bench with steady assist.  Discussed potential of standing for bathing, discussed safety and endurance as possible recommendations for use of tub bench - to be further determined as pt progresses.  Therapy Documentation Precautions:  Precautions Precautions: Fall Precaution Comments: direct anterior, no precautions Restrictions Weight Bearing Restrictions: Yes LLE Weight Bearing: Weight bearing as tolerated Pain:  Pt reports pain 6/10 in Lt hip, premedicated  See Function Navigator for Current Functional Status.   Therapy/Group: Individual Therapy  Rosalio LoudHOXIE, Nicholas Nikolai 07/28/2015, 3:23 PM

## 2015-07-28 NOTE — Progress Notes (Signed)
Subjective:     Patient reports pain as mild.    Objective: Vital signs in last 24 hours: Temp:  [98.6 F (37 C)] 98.6 F (37 C) (03/21 0542) Pulse Rate:  [107] 107 (03/21 0542) Resp:  [18] 18 (03/21 0542) BP: (109)/(67) 109/67 mmHg (03/21 0542) SpO2:  [95 %] 95 % (03/21 0542)  Intake/Output from previous day: 03/20 0701 - 03/21 0700 In: 240 [P.O.:240] Out: 1550 [Urine:1550] Intake/Output this shift:     Recent Labs  07/26/15 0556 07/27/15 0641  HGB 9.9* 10.2*    Recent Labs  07/26/15 0556 07/27/15 0641  WBC 12.3* 10.5  RBC 3.43* 3.44*  HCT 31.1* 31.6*  PLT 471* 474*    Recent Labs  07/26/15 0556 07/27/15 0641  NA 135 137  K 3.5 3.3*  CL 94* 94*  CO2 30 32  BUN 24* 23*  CREATININE 1.18 1.05  GLUCOSE 231* 191*  CALCIUM 9.0 8.9   No results for input(s): LABPT, INR in the last 72 hours.  Left hip: Incision: scant drainage on dressing no active drainage Skin well approximated   Assessment/Plan:     Dressing changed today DO NOT remove dressing. Patient may shower with dressing intact.  Follow up with Dr. Magnus IvanBlackman in office at 2 weeks post-op  Richardean CanalCLARK, GILBERT 07/28/2015, 10:51 AM

## 2015-07-28 NOTE — Progress Notes (Signed)
Physical Therapy Session Note  Patient Details  Name: [name] MRN: [id number] Date of Birth: 08/29/1975  Today's Date: 07/28/2015 PT Individual Time: 1445-1600 PT Individual Time Calculation (min): 75 min   Short Term Goals: Week 1:  PT Short Term Goal 1 (Week 1): mod I for bed mobility with rails PT Short Term Goal 2 (Week 1): S for transfers bed to chair, chair to bed PT Short Term Goal 3 (Week 1): S for ambulation with rolling walker 150 feet.  PT Short Term Goal 4 (Week 1): Ascend/descend 1curb with rolling walker and S PT Short Term Goal 5 (Week 1): ascend/descend 4 stairs with B rails and min A  Skilled Therapeutic Interventions/Progress Updates:  Tx focused on functional mobility training, gait with RW, and therex for strengthening and ROM. Attempted raising pt's RW for improved upright posture, but he was resistant initially, eventually agreeable.   Total Joint Exercises all bil x10 with 5 sec holds as appropriate and cues for technique.  Supine: Short Arc Quad 1.5 # cuff weights Heel slides with AAROM Hip ABduction/ADduction: Seated  LAQ,  Standing L hip ABD and HS curls   Gait training 4x100' and 1x150' with close S and postural cues and full hip ext in standing, although pt unable to modify gait pattern.   Sit<>stands with S and increased time and compensatory strategies from 25" surfaces x4 Supine<>sit with S and UE for supporting LLE, using momentum x2. Pt needed bolster behind knees for back comfort. Pt noted to have significant hip ext ROM on L, not to neutral. Pt educated on importance of neutral hip alignment.    Stair training x12 with bil rails, heavy UE reliance and self-selected reciprocal or step-to pattern depending.   Pt left in bed with all needs in reach, ice provided for comfort.   Therapy Documentation Precautions:  Precautions Precautions: Fall Precaution Comments: direct anterior, no precautions Restrictions Weight Bearing Restrictions:  Yes LLE Weight Bearing: Weight bearing as tolerated General:   Vital Signs:  Pain: none reported   See Function Navigator for Current Functional Status.   Therapy/Group: Individual Therapy   Clydene Lamingole Tamiko Leopard, PT, DPT  07/28/2015, 2:48 PM

## 2015-07-28 NOTE — Progress Notes (Signed)
[facility] PHYSICAL MEDICINE & REHABILITATION     PROGRESS NOTE    Subjective/Complaints: Feeling better today. No nausea. Eating breakfast this am. Pain under control  ROS: Pt denies fever, rash , headache, blurred or double vision, nausea, vomiting, abdominal pain, diarrhea, chest pain, shortness of breath, palpitations, dysuria, dizziness, neck or back pain, bleeding, anxiety, or depression  Objective: Vital Signs: Blood pressure 109/67, pulse 107, temperature 98.6 F (37 C), temperature source Oral, resp. rate 18, height 6\' 2"  (1.88 m), SpO2 95 %. Dg Abd Portable 1v  07/27/2015  CLINICAL DATA:  Nausea. Concern for obstruction postop left hip replacement. EXAM: PORTABLE ABDOMEN - 1 VIEW COMPARISON:  07/21/2015 hip radiographs. FINDINGS: No gross intraperitoneal free air is identified on this limited portable, supine study. A small to moderate amount of stool is noted in the proximal colon. Gas is present in the nondilated left-sided colon. No dilated loops of bowel are seen to suggest obstruction. Prior lumbar spine fusion, left hip arthroplasty, and advanced right hip osteoarthrosis are noted. IMPRESSION: No evidence of bowel obstruction. Electronically Signed   By: Sebastian AcheAllen  Grady M.D.   On: 07/27/2015 16:49    Recent Labs  07/26/15 0556 07/27/15 0641  WBC 12.3* 10.5  HGB 9.9* 10.2*  HCT 31.1* 31.6*  PLT 471* 474*    Recent Labs  07/26/15 0556 07/27/15 0641  NA 135 137  K 3.5 3.3*  CL 94* 94*  GLUCOSE 231* 191*  BUN 24* 23*  CREATININE 1.18 1.05  CALCIUM 9.0 8.9   CBG (last 3)   Recent Labs  07/27/15 1645 07/27/15 2055 07/28/15 0647  GLUCAP 183* 204* 169*    Wt Readings from Last 3 Encounters:  07/21/15 155.584 kg (343 lb)  07/14/15 137.44 kg (303 lb)  01/30/14 159.666 kg (352 lb)    Physical Exam:  Constitutional: He is oriented to person, place, and time.  40 year old right-handed morbidly obese male  HENT: oral mucosa pink and moist Head:  Normocephalic.  Eyes: EOM are normal.  Neck: Normal range of motion. Neck supple. No thyromegaly present.  Cardiovascular: Normal rate and regular rhythm.  Respiratory: Effort normal and breath sounds normal. No respiratory distress.  GI: Soft. Bowel sounds are normal. He exhibits no distension.  Musculoskeletal:  Left hip limited ROM due to pain. Continued tenderness with AROM/PROM Right hip. bilateral MTP's less tender with palpation. No redness or warmth seen in the feet---may have some swelling at MTP's. Knees are unremarkable Neurological: He is alert and oriented to person, place, and time.  5/5 UE. LLE 1/5 HF, ke and 3/5 adf/pf. RLE: 2/5 hf, k3 and 3/5 adf/pf. No sensory deficits.  Skin:  Hip incision is dressed, clean without redness, drainage or warmth.  Psychiatric: He has a normal mood and affect. His behavior is normal   Assessment/Plan: 1. Functional and mobility deficits secondary to OA left hip, left THA which require 3+ hours per day of interdisciplinary therapy in a comprehensive inpatient rehab setting. Physiatrist is providing close team supervision and 24 hour management of active medical problems listed below. Physiatrist and rehab team continue to assess barriers to discharge/monitor patient progress toward functional and medical goals.  Function:  Bathing Bathing position   Position: Shower  Bathing parts Body parts bathed by patient: Right arm, Left arm, Chest, Abdomen, Right upper leg, Left upper leg, Right lower leg, Left lower leg (using LH sponge for BLE) Body parts bathed by helper: Front perineal area, Buttocks  Bathing assist Assist Level: Touching or  steadying assistance(Pt > 75%)      Upper Body Dressing/Undressing Upper body dressing   What is the patient wearing?: Pull over shirt/dress     Pull over shirt/dress - Perfomed by patient: Thread/unthread right sleeve, Thread/unthread left sleeve, Put head through opening, Pull shirt over  trunk Pull over shirt/dress - Perfomed by helper: Pull shirt over trunk        Upper body assist Assist Level: Set up   Set up : To obtain clothing/put away  Lower Body Dressing/Undressing Lower body dressing   What is the patient wearing?: Non-skid slipper socks, Pants       Pants- Performed by helper: Pull pants up/down, Thread/unthread left pants leg, Thread/unthread right pants leg   Non-skid slipper socks- Performed by helper: Don/doff left sock, Don/doff right sock                  Lower body assist        Toileting Toileting Toileting activity did not occur: N/A (did not occur) Toileting steps completed by patient: Adjust clothing prior to toileting, Adjust clothing after toileting Toileting steps completed by helper: Performs perineal hygiene Toileting Assistive Devices: Grab bar or rail  Toileting assist Assist level: Set up/obtain supplies   Transfers Chair/bed transfer   Chair/bed transfer method: Ambulatory Chair/bed transfer assist level: Supervision or verbal cues (close supervision for safety) Chair/bed transfer assistive device: Environmental consultant, Immunologist, Hospital doctor     Max distance: 75 Assist level: Supervision or verbal cues   Wheelchair   Type: Manual Max wheelchair distance: 100 Assist Level: Supervision or verbal cues  Cognition Comprehension Comprehension assist level: Follows complex conversation/direction with no assist  Expression Expression assist level: Expresses complex ideas: With extra time/assistive device  Social Interaction Social Interaction assist level: Interacts appropriately with others - No medications needed.  Problem Solving Problem solving assist level: Solves complex problems: With extra time  Memory Memory assist level: More than reasonable amount of time   Medical Problem List and Plan: 1. 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 -continue CIR therapies. Team conf today. - Weightbearing as tolerated LLE with anterior hip precautions 2. DVT Prophylaxis/Anticoagulation: Aspirin 325 mg twice a day.vasc study neg  3. Pain Management: Oxycodone and Robaxin as needed. 4. Acute blood loss anemia. Follow-up hgb 10.2 yest 5. Neuropsych: This patient is capable of making decisions on his own behalf. 6. Skin/Wound Care: Routine skin checks 7. Fluids/Electrolytes/Nutrition: Replacing potassium 8. Hypertension. Maxzide 1 tablet daily. Monitor with increased mobility 9. Diabetes mellitus of peripheral neuropathy. Hemoglobin A1c 6.6.Invokana 100 mg daily. Check blood sugars before meals and at bedtime. Diabetic teaching  -SSI at present 10. Morbid obesity. Dietary follow-up 11. Low grade temp/leukocytosis. Wound looks appropriate, chest clear  -dopplers negative  -ua neg, cx nex  -wbc's down yesterday  -likely reactive to gout flare---rx'ed with colchicine x 2 days. ua level 8.3  -constipation related? 12. Tachycardia  -likely related to ABLA and volume depletion---showing some improvemet  -continue to monitor.   -  Pushing fluids 13. Constipation  -SSE with results  -changed to senokot s  -kub with only mild residual stool  LOS (Days) 4 A FACE TO FACE EVALUATION WAS PERFORMED  SWARTZ,ZACHARY T 07/28/2015 8:17 AM

## 2015-07-28 NOTE — Progress Notes (Signed)
Social Work  Social Work Assessment and Plan  Patient Details  Name: [name] MRN: [id number] Date of Birth: 1975/05/25  Today's Date: 07/27/2015  Problem List:  Patient Active Problem List   Diagnosis Date Noted  . Osteoarthritis of right hip 07/24/2015  . Morbid obesity ([place]) 07/24/2015  . Osteoarthritis of left hip 07/21/2015  . Status post total replacement of left hip 07/21/2015  . Lumbar stenosis with neurogenic claudication 01/14/2014  . Spinal stenosis, lumbar region, with neurogenic claudication 07/30/2012   Past Medical History:  Past Medical History  Diagnosis Date  . Hypertension   . Diabetes mellitus without complication ([place])   . Arthritis   . Anemia   . Anginal pain ([place])   . Sleep apnea     wore CPAP prior to tonsillectomy  . Asthma     as a child  . H/O transfusion of packed red blood cells     with prior lumbar fusions x2   Past Surgical History:  Past Surgical History  Procedure Laterality Date  . Tonsillectomy  2005     for sleep apnea  . Wisdom tooth extraction    . Back surgery      lumbar fusion  . Total hip arthroplasty Left 07/21/2015    Procedure: LEFT TOTAL HIP ARTHROPLASTY ANTERIOR APPROACH;  Surgeon: Mcarthur Rossetti, MD;  Location: [facility];  Service: Orthopedics;  Laterality: Left;   Social History:  reports that he quit smoking about 3 years ago. His smoking use included Cigarettes. He has a 10 pack-year smoking history. He does not have any smokeless tobacco history on file. He reports that he does not drink alcohol or use illicit drugs.  Family / Support Systems Marital Status: Married How Long?: 17 yrs Patient Roles: Spouse Spouse/Significant Other: wife, Davyon Fisch @ (C) [phone] Children: No children Other Supports: Pt's father, Tiras Bianchini [facility]) Anticipated Caregiver: Spouse: Martarash Ability/Limitations of Caregiver: Spouse works M-F and is gone 12 hours at a time Careers adviser: Evenings  only Family Dynamics: Pt describes wife and father as very supportive, however, wife is away from home with work up to 12 hrs / day.  Social History Preferred language: English Religion: Baptist Cultural Background: NA Read: Yes Write: Yes Employment Status: Unemployed Date Retired/Disabled/Unemployed: since Jan 2015;  initially taken out of work due to a Workers Comp injury. Legal Hisotry/Current Legal Issues: (Worker's Comp case is now closed after he underwent two back surgeries) Guardian/Conservator: None - per MD, pt is capable of making decisions on his own behalf.   Abuse/Neglect Physical Abuse: Denies Verbal Abuse: Denies Sexual Abuse: Denies Exploitation of patient/patient's resources: Denies Self-Neglect: Denies  Emotional Status Pt's affect, behavior adn adjustment status: Pt lying in bed and reports very fatigued from full day of therapies.  Very pleasant and able to complete assessment interview without difficulty.  He denies any s/s of emotional distress and specifically no s/s of depression.  He is very realistic about his limitations and reports he will have the other hip replaced when medically cleared from this surgery.   Recent Psychosocial Issues: Financial strains due to being out of work x 2 yrs.  WC income has completed. Pyschiatric History: None Substance Abuse History: None  Patient / Family Perceptions, Expectations & Goals Pt/Family understanding of illness & functional limitations: Pt and wife with very good understanding of surgery performed and of current functional limitations/ need for CIR. Premorbid pt/family roles/activities: Pt was independent overall PTA. Was driving himself to water aerobic  classes. Anticipated changes in roles/activities/participation: Pt with mod independent goals.  Little change anticipated if goals met.  Pt reports his father can assist with daytime transportation needs. Pt/family expectations/goals: Pt hopeful to reach mod i  goals.  Community [employer] Agencies: None Premorbid Home Care/DME Agencies: Other (Comment) (HH and OP after back surgeries) Transportation available at discharge: yes  Discharge Planning Living Arrangements: Spouse/significant other Support Systems: Spouse/significant other, Parent, Friends/neighbors Type of Residence: Private residence Insurance Resources: Multimedia programmer (specify) Nurse, mental health) Financial Resources:  (Wife's income) Financial Screen Referred: No Living Expenses: Rent Money Management: Patient Does the patient have any problems obtaining your medications?: No Home Management: Pt was primary care for home Patient/Family Preliminary Plans: Pt to return to his home with wife available to assist evenings only. Social Work Anticipated Follow Up Needs: HH/OP Expected length of stay: 7-11 days   Clinical Impression Pleasant gentleman here following a THR.  Lying in bed after a full day of txs and fatigued.  Goals set for modified independent and wife is home but does work full-time days.  Pt denies any emotional distress or concerns. Will follow for support and d/c planning needs.  Zacarias Krauter 07/27/2015, 4:25 PM

## 2015-07-28 NOTE — Progress Notes (Signed)
Occupational Therapy Session Note  Patient Details  Name: [name] MRN: [id number] Date of Birth: 05/25/1975  Today's Date: 07/28/2015 OT Individual Time: 0900-1000 OT Individual Time Calculation (min): 60 min    Short Term Goals: Week 1:  OT Short Term Goal 1 (Week 1): STG=LTG secondary to short ELOS  Skilled Therapeutic Interventions/Progress Updates:    Pt engaged in BADL retraining including bathing at shower level and dressing with sit<>stand from w/c.  Pt completed all tasks at supervision level except socks, using AE appropriately.  Pt amb in room with RW to gather supplies prior to entering bathroom.  Pt requires more than a reasonable amount of time to complete all tasks with multiple rest breaks.  Focus on activity tolerance, sit<>stand, standing balance, funcitonal amb with RW, and safety awareness.  Therapy Documentation Precautions:  Precautions Precautions: Fall Precaution Comments: direct anterior, no precautions Restrictions Weight Bearing Restrictions: Yes LLE Weight Bearing: Weight bearing as tolerated  Pain: Pain Assessment Pain Assessment: 0-10 Pain Score: 7  Pain Type: Acute pain Pain Location: Hip Pain Orientation: Left Pain Descriptors / Indicators: Aching Pain Frequency: Constant Pain Onset: On-going Patients Stated Pain Goal: 4 Pain Intervention(s): Repositioned;RN made aware;Rest  See Function Navigator for Current Functional Status.   Therapy/Group: Individual Therapy  Rich BraveLanier, Greig Altergott Chappell 07/28/2015, 11:14 AM

## 2015-07-28 NOTE — Progress Notes (Signed)
Physical Therapy Session Note  Patient Details  Name: Nicholas RearDarius D Filo MRN: [id number] Date of Birth: 01/26/1976  Today's Date: 07/28/2015 PT Individual Time: 1032-1125 PT Individual Time Calculation (min): 53 min   Short Term Goals: Week 1:  PT Short Term Goal 1 (Week 1): mod I for bed mobility with rails PT Short Term Goal 2 (Week 1): S for transfers bed to chair, chair to bed PT Short Term Goal 3 (Week 1): S for ambulation with rolling walker 150 feet.  PT Short Term Goal 4 (Week 1): Ascend/descend 1curb with rolling walker and S PT Short Term Goal 5 (Week 1): ascend/descend 4 stairs with B rails and min A  Skilled Therapeutic Interventions/Progress Updates:   Patient in bed, PA-C departing after changing dressing. With increased time, patient transferred edge of bed using hospital bed functions and rail with supervision and sat EOB to don socks/shoes using sock aide and LH shoe horn with assist for donning L shoe/tying laces. Patient transferred sit <> stand from tall surfaces using RW with increased time and effort, supervision. Gait training using RW 2 x 170 ft to and from gym with close supervision and 1 standing rest break each way, forward flexed posture and antalgic gait pattern despite cues for upright posture. Patient required prolonged seated rest break to recover from ambulation, patient requested to monitor seated HR = 118 which he reports is normal since surgery. Engaged in discharge planning discussion and requested that patient's wife brings in measurements for seat height of furniture, bed, and car seat as he declined to practice sit <> stand transfers from lower surfaces. Stair training up/down 4 (6") stairs using 2 rails with heavy dependence on BUE, trialing leading with both R and L LE ascending/descending with patient reporting about equal pain due to R hip OA and gout in knees, supervision. Patient ambulated back to room and [facility] with call bell in reach, RN  notified of patient position.   Therapy Documentation Precautions:  Precautions Precautions: Fall Precaution Comments: direct anterior, no precautions Restrictions Weight Bearing Restrictions: Yes LLE Weight Bearing: Weight bearing as tolerated Pain: Pain Assessment Pain Assessment: 0-10 Pain Score: 7  Pain Type: Acute pain Pain Location: Hip Pain Orientation: Left Pain Descriptors / Indicators: Aching Pain Frequency: Constant Pain Onset: On-going Patients Stated Pain Goal: 4 Pain Intervention(s): Repositioned;RN made aware;Rest   See Function Navigator for Current Functional Status.   Therapy/Group: Individual Therapy  Kerney ElbeVarner, Zenola Dezarn A 07/28/2015, 11:29 AM

## 2015-07-29 ENCOUNTER — Inpatient Hospital Stay (HOSPITAL_COMMUNITY): Payer: Self-pay

## 2015-07-29 ENCOUNTER — Inpatient Hospital Stay (HOSPITAL_COMMUNITY): Payer: BLUE CROSS/BLUE SHIELD | Admitting: Physical Therapy

## 2015-07-29 ENCOUNTER — Inpatient Hospital Stay (HOSPITAL_COMMUNITY): Payer: BLUE CROSS/BLUE SHIELD

## 2015-07-29 LAB — GLUCOSE, CAPILLARY
GLUCOSE-CAPILLARY: 145 mg/dL — AB (ref 65–99)
GLUCOSE-CAPILLARY: 123 mg/dL — AB (ref 65–99)
Glucose-Capillary: 165 mg/dL — ABNORMAL HIGH (ref 65–99)
Glucose-Capillary: 155 mg/dL — ABNORMAL HIGH (ref 65–99)
Glucose-Capillary: 205 mg/dL — ABNORMAL HIGH (ref 65–99)

## 2015-07-29 NOTE — Progress Notes (Signed)
Physical Therapy Session Note  Patient Details  Name: Nicholas RearDarius D Sedlar MRN: [id number] Date of Birth: 10/08/1975  Today's Date: 07/29/2015 PT Individual Time: 1445-1530 PT Individual Time Calculation (min): 45 min   Short Term Goals: Week 1:  PT Short Term Goal 1 (Week 1): mod I for bed mobility with rails PT Short Term Goal 2 (Week 1): S for transfers bed to chair, chair to bed PT Short Term Goal 3 (Week 1): S for ambulation with rolling walker 150 feet.  PT Short Term Goal 4 (Week 1): Ascend/descend 1curb with rolling walker and S PT Short Term Goal 5 (Week 1): ascend/descend 4 stairs with B rails and min A  Skilled Therapeutic Interventions/Progress Updates:   Patient in gym following OT session. Instructed in TUG using RW, average of 3 trials (43, 36, 33 sec) = 37.3 seconds. Patient transferred sit <> supine on mat table with increased time and cues for breathing technique, pillow between knees when returning to sit to maintain precautions. Performed supine BLE therex for strengthening and ROM x10 each LE with 5 sec holds as appropriate and cues for technique: Short arc quad over bolster, heel slides with AAROM LLE, hip abduction RLE only, patient unable to tolerate AAROM LLE. Patient reported need for bathroom, ambulated back to room using RW with distant supervision and left sitting on commode, verbalized understanding to call for assistance when finished.   Therapy Documentation Precautions:  Precautions Precautions: Anterior Hip, Fall Precaution Comments: direct anterior, no precautions Restrictions Weight Bearing Restrictions: Yes LLE Weight Bearing: Weight bearing as tolerated Pain: Pain Assessment Pain Assessment: 0-10 Pain Score: 6  Pain Type: Acute pain Pain Location: Hip Pain Orientation: Left Pain Descriptors / Indicators: Aching Pain Onset: On-going Pain Intervention(s): Repositioned;Ambulation/increased activity;Rest   See Function Navigator for Current Functional  Status.   Therapy/Group: Individual Therapy  Kerney ElbeVarner, Deborh Pense A 07/29/2015, 3:34 PM

## 2015-07-29 NOTE — Progress Notes (Signed)
Occupational Therapy Session Note  Patient Details  Name: [name] MRN: [id number] Date of Birth: 03/10/1976  Today's Date: 07/29/2015 OT Individual Time: 1000-1115 OT Individual Time Calculation (min): 75 min    Short Term Goals: Week 1:  OT Short Term Goal 1 (Week 1): STG=LTG secondary to short ELOS  Skilled Therapeutic Interventions/Progress Updates:    Pt resting in bed upon arrival.  Pt stated he didn't "feel well" this morning but agreeable to engaging in BADLs in hopes of helping him to get going and feeling better.  Pt performed grooming tasks at sink prior to shower, including shaving, shaving head, and brushing teeth.  Pt stated that normally he stands to complete tasks but his sink at home was taller.  Pt amb with RW into bathroom to complete bathing tasks with sit<>stand from tub bench.  Pt completed dressing tasks after session ended.  Pt requires more than a reasonable amount of time to complete tasks and time constraints prohibited pt completing dressing tasks during allotted time.  Pt completed bathing tasks with supervision.  Focus on activity tolerance, standing balance, functional amb with RW, and safety awareness to increase independence with BADLs.  Therapy Documentation Precautions:  Precautions Precautions: Fall Precaution Comments: direct anterior, no precautions Restrictions Weight Bearing Restrictions: Yes LLE Weight Bearing: Weight bearing as tolerated Pain: Pain Assessment Pain Assessment: 0-10 Pain Score: 3  Pain Type: Surgical pain Pain Location: Hip Pain Orientation: Left Pain Descriptors / Indicators: Aching Pain Frequency: Constant Pain Onset: On-going Patients Stated Pain Goal: 2 Pain Intervention(s): ;Repositioned;Rest Multiple Pain Sites: No  See Function Navigator for Current Functional Status.   Therapy/Group: Individual Therapy  Rich BraveLanier, Blakelee Allington Chappell 07/29/2015, 12:12 PM

## 2015-07-29 NOTE — Progress Notes (Signed)
[place] PHYSICAL MEDICINE & REHABILITATION     PROGRESS NOTE    Subjective/Complaints: Feels a little queezy this morning. Thinks he slept "wrong". Moved bowels twice yesterday  ROS: Pt denies fever, rash , headache, blurred or double vision,   vomiting, abdominal pain, diarrhea, chest pain, shortness of breath, palpitations, dysuria, dizziness, neck or back pain, bleeding, anxiety, or depression  Objective: Vital Signs: Blood pressure 119/67, pulse 100, temperature 98.2 F (36.8 C), temperature source Oral, resp. rate 20, height  (1.88 m), SpO2 95 %. Dg Abd Portable 1v  07/27/2015  CLINICAL DATA:  Nausea. Concern for obstruction postop left hip replacement. EXAM: PORTABLE ABDOMEN - 1 VIEW COMPARISON:  07/21/2015 hip radiographs. FINDINGS: No gross intraperitoneal free air is identified on this limited portable, supine study. A small to moderate amount of stool is noted in the proximal colon. Gas is present in the nondilated left-sided colon. No dilated loops of bowel are seen to suggest obstruction. Prior lumbar spine fusion, left hip arthroplasty, and advanced right hip osteoarthrosis are noted. IMPRESSION: No evidence of bowel obstruction. Electronically Signed   By: Sebastian Ache M.D.   On: 07/27/2015 16:49    Recent Labs  07/27/15 0641  WBC 10.5  HGB 10.2*  HCT 31.6*  PLT 474*    Recent Labs  07/27/15 0641  NA 137  K 3.3*  CL 94*  GLUCOSE 191*  BUN 23*  CREATININE 1.05  CALCIUM 8.9   CBG (last 3)   Recent Labs  07/28/15 2027 07/29/15 0639 07/29/15 0833  GLUCAP 157* 145* 123*    Wt Readings from Last 3 Encounters:  07/21/15 155.584 kg (343 lb)  07/14/15 137.44 kg (303 lb)  01/30/14 159.666 kg (352 lb)    Physical Exam:  Constitutional: He is oriented to person, place, and time.  40 year old right-handed morbidly obese male  HENT: oral mucosa pink and moist Head: Normocephalic.  Eyes: EOM are normal.  Neck: Normal range of motion. Neck  supple. No thyromegaly present.  Cardiovascular: Normal rate and regular rhythm.  Respiratory: Effort normal and breath sounds normal. No respiratory distress.  GI: Soft. Bowel sounds are normal. He exhibits no distension. No tenderness Musculoskeletal:  Left hip limited ROM due to pain. Continued tenderness with AROM/PROM Right hip. bilateral MTP's less tender with palpation. No redness or warmth seen in the feet---may have some swelling at MTP's. Knees are unremarkable Neurological: He is alert and oriented to person, place, and time.  5/5 UE. LLE 1/5 HF, ke and 3/5 adf/pf. RLE: 2/5 hf, k3 and 3/5 adf/pf. No sensory deficits.  Skin:  Hip incision is dressed, clean without redness, drainage or warmth.  Psychiatric: He has a normal mood and affect. His behavior is normal   Assessment/Plan: 1. Functional and mobility deficits secondary to OA left hip, left THA which require 3+ hours per day of interdisciplinary therapy in a comprehensive inpatient rehab setting. Physiatrist is providing close team supervision and 24 hour management of active medical problems listed below. Physiatrist and rehab team continue to assess barriers to discharge/monitor patient progress toward functional and medical goals.  Function:  Bathing Bathing position   Position: Shower  Bathing parts Body parts bathed by patient: Right arm, Left arm, Chest, Abdomen, Right upper leg, Left upper leg, Right lower leg, Left lower leg, Front perineal area, Buttocks Body parts bathed by helper: Front perineal area, Buttocks  Bathing assist Assist Level: Supervision or verbal cues      Upper Body Dressing/Undressing Upper body  dressing   What is the patient wearing?: Pull over shirt/dress     Pull over shirt/dress - Perfomed by patient: Thread/unthread right sleeve, Thread/unthread left sleeve, Put head through opening, Pull shirt over trunk Pull over shirt/dress - Perfomed by helper: Pull shirt over trunk         Upper body assist Assist Level: No help, No cues   Set up : To obtain clothing/put away  Lower Body Dressing/Undressing Lower body dressing   What is the patient wearing?: Socks, Shoes Underwear - Performed by patient: Thread/unthread right underwear leg, Thread/unthread left underwear leg, Pull underwear up/down   Pants- Performed by patient: Thread/unthread right pants leg, Thread/unthread left pants leg, Pull pants up/down, Fasten/unfasten pants Pants- Performed by helper: Pull pants up/down, Thread/unthread left pants leg, Thread/unthread right pants leg   Non-skid slipper socks- Performed by helper: Don/doff right sock, Don/doff left sock Socks - Performed by patient: Don/doff right sock, Don/doff left sock   Shoes - Performed by patient: Don/doff right shoe, Don/doff left shoe            Lower body assist Assist for lower body dressing: Assistive device, Set up, Supervision or verbal cues Assistive Device Comment: long handled shoe horn, reacher, sock aid Set up : To obtain clothing/put away  Toileting Toileting Toileting activity did not occur: N/A (did not occur) Toileting steps completed by patient: Adjust clothing prior to toileting, Adjust clothing after toileting Toileting steps completed by helper: Performs perineal hygiene Toileting Assistive Devices: Grab bar or rail  Toileting assist Assist level: Set up/obtain supplies   Transfers Chair/bed transfer   Chair/bed transfer method: Ambulatory Chair/bed transfer assist level: Supervision or verbal cues Chair/bed transfer assistive device: Armrests, Patent attorneyWalker     Locomotion Ambulation     Max distance: 170 Assist level: Supervision or verbal cues   Wheelchair   Type: Manual Max wheelchair distance: 100 Assist Level: Supervision or verbal cues  Cognition Comprehension Comprehension assist level: Follows complex conversation/direction with no assist  Expression Expression assist level: Expresses complex  ideas: With extra time/assistive device  Social Interaction Social Interaction assist level: Interacts appropriately with others - No medications needed.  Problem Solving Problem solving assist level: Solves complex problems: With extra time  Memory Memory assist level: More than reasonable amount of time   Medical Problem List and Plan: 1. 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 -continue CIR therapies.  . - Weightbearing as tolerated LLE with anterior hip precautions 2. DVT Prophylaxis/Anticoagulation: Aspirin 325 mg twice a day.vasc study neg  3. Pain Management: Oxycodone and Robaxin as needed. 4. Acute blood loss anemia. Follow-up hgb 10.2 yest 5. Neuropsych: This patient is capable of making decisions on his own behalf. 6. Skin/Wound Care: Routine skin checks 7. Fluids/Electrolytes/Nutrition: Replacing potassium 8. Hypertension. Maxzide 1 tablet daily. Monitor with increased mobility 9. Diabetes mellitus of peripheral neuropathy. Hemoglobin A1c 6.6.Invokana 100 mg daily. Check blood sugars before meals and at bedtime. Diabetic teaching  -SSI at present 10. Morbid obesity. Dietary follow-up 11. Low grade temp/leukocytosis. Wound looks appropriate, chest clear  -dopplers negative  -ua neg, cx nex  -wbc's down to 10.5  -likely reactive to gout flare---rx'ed with colchicine x 2 days. ua level 8.3    12. Tachycardia  -likely related to ABLA and volume depletion---improving  -continue to monitor.   -  Pushing fluids 13. Constipation/nausea  -SSE with results  -changed to senokot s  -had  two bm's yesterday  LOS (Days) 5 A FACE TO FACE EVALUATION WAS PERFORMED  SWARTZ,ZACHARY T 07/29/2015 8:41 AM

## 2015-07-29 NOTE — Progress Notes (Signed)
Occupational Therapy Note  Patient Details  Name: Madelin RearDarius D Kipp MRN: [id number] Date of Birth: 08/10/1975  Today's Date: 07/29/2015 OT Individual Time: 1345-1430 OT Individual Time Calculation (min): 45 min   Pt c/o 4/10 pain in right hip/groin; repositioned, rest Individual Therapy  Pt resting in w/c upon arrival.  Pt transitioned to main entrance of hospital and engaged in functional amb with RW on uneven surface to simulate environment after discharge.  Pt stopped for standing rest breaks appropriately. Discussed discharge plans, home safety recommendations, kitchen safety, and RW safety.  Pt issued RW bag and reacher bag for use at home.  Focus on activity tolerance, standing balance, functional amb with RW, and safety awareness to increased independence with BADLs and IADLs.   Lavone NeriLanier, Dajanay Northrup [facility] 07/29/2015, 2:33 PM

## 2015-07-29 NOTE — Progress Notes (Signed)
Physical Therapy Session Note  Patient Details  Name: [name] MRN: [id number] Date of Birth: 07/06/1975  Today's Date: 07/29/2015 PT Individual Time: 3086-57840845-0915 PT Individual Time Calculation (min): 30 min   Short Term Goals: Week 1:  PT Short Term Goal 1 (Week 1): mod I for bed mobility with rails PT Short Term Goal 2 (Week 1): S for transfers bed to chair, chair to bed PT Short Term Goal 3 (Week 1): S for ambulation with rolling walker 150 feet.  PT Short Term Goal 4 (Week 1): Ascend/descend 1curb with rolling walker and S PT Short Term Goal 5 (Week 1): ascend/descend 4 stairs with B rails and min A  Skilled Therapeutic Interventions/Progress Updates:   Pt agreeable to attempt therapy but reports not feeling well this morning. Engaged in self dressing from seated position in w/c with adaptive equipment with extra time overall and set-up assist for obtaining clothing. Pt requires supervision for standing balance and sit <> stands with cues for safety as pt tendency to lean forward flexed with elbows on RW for support. Pt performed short gait within room but expressed desire to return back to bed due to not feeling well. Transferred with supervision and positioned in R sidelying at supervision level and extra time for all movement tasks. Pt missed 15 min of skilled PT session due to not feeling well but unable to verbalize exactly what felt "off" today. RN aware.  Therapy Documentation Precautions:  Precautions Precautions: Fall Precaution Comments: direct anterior, no precautions Restrictions Weight Bearing Restrictions: Yes LLE Weight Bearing: Weight bearing as tolerated  Pain: Reports pain is per usual but c/o overall just not feeling well this morning. RN reports vitals were WFL.  See Function Navigator for Current Functional Status.   Therapy/Group: Individual Therapy  Karolee StampsGray, Benelli Winther Darrol PokeBrescia  [name], PT, DPT  07/29/2015, 10:15 AM

## 2015-07-30 ENCOUNTER — Inpatient Hospital Stay (HOSPITAL_COMMUNITY): Payer: BLUE CROSS/BLUE SHIELD | Admitting: Physical Therapy

## 2015-07-30 ENCOUNTER — Inpatient Hospital Stay (HOSPITAL_COMMUNITY): Payer: BLUE CROSS/BLUE SHIELD

## 2015-07-30 DIAGNOSIS — M1651 Unilateral post-traumatic osteoarthritis, right hip: Secondary | ICD-10-CM

## 2015-07-30 LAB — GLUCOSE, CAPILLARY
GLUCOSE-CAPILLARY: 221 mg/dL — AB (ref 65–99)
Glucose-Capillary: 152 mg/dL — ABNORMAL HIGH (ref 65–99)
Glucose-Capillary: 146 mg/dL — ABNORMAL HIGH (ref 65–99)
Glucose-Capillary: 132 mg/dL — ABNORMAL HIGH (ref 65–99)

## 2015-07-30 NOTE — Progress Notes (Signed)
[place] PHYSICAL MEDICINE & REHABILITATION     PROGRESS NOTE    Subjective/Complaints: Feels well today. Moved bowels twice yesterday. Appetite good. Pain controlled. Making progress in therapies  ROS: Pt denies fever, rash , headache, blurred or double vision,   vomiting, abdominal pain, diarrhea, chest pain, shortness of breath, palpitations, dysuria, dizziness, neck or back pain, bleeding, anxiety, or depression  Objective: Vital Signs: Blood pressure 104/66, pulse 103, temperature 98.3 F (36.8 C), temperature source Oral, resp. rate 18, height 6\' 2"  (1.88 m), weight 146.875 kg (323 lb 12.8 oz), SpO2 98 %. No results found. No results for input(s): WBC, HGB, HCT, PLT in the last 72 hours. No results for input(s): NA, K, CL, GLUCOSE, BUN, CREATININE, CALCIUM in the last 72 hours.  Invalid input(s): CO CBG (last 3)   Recent Labs  07/29/15 1657 07/29/15 2110 07/30/15 0641  GLUCAP 155* 205* 152*    Wt Readings from Last 3 Encounters:  07/29/15 146.875 kg (323 lb 12.8 oz)  07/21/15 155.584 kg (343 lb)  07/14/15 137.44 kg (303 lb)    Physical Exam:  Constitutional: He is oriented to person, place, and time.  40 year old right-handed morbidly obese male  HENT: oral mucosa pink and moist Head: Normocephalic.  Eyes: EOM are normal.  Neck: Normal range of motion. Neck supple. No thyromegaly present.  Cardiovascular: Normal rate and regular rhythm.  Respiratory: Effort normal and breath sounds normal. No respiratory distress.  GI: Soft. Bowel sounds are normal. He exhibits no distension. No tenderness Musculoskeletal:  Left hip limited ROM due to pain. Continued tenderness with AROM/PROM Right hip. bilateral MTP's less tender with palpation. No redness or warmth seen in the feet---may have some swelling at MTP's. Knees are unremarkable Neurological: He is alert and oriented to person, place, and time.  5/5 UE. LLE 1/5 HF, ke and 3/5 adf/pf. RLE: 2/5 hf, k3 and  3/5 adf/pf. No sensory deficits.  Skin:  Hip incision is dressed, clean without redness, drainage or warmth.  Psychiatric: He has a normal mood and affect. His behavior is normal   Assessment/Plan: 1. Functional and mobility deficits secondary to OA left hip, left THA which require 3+ hours per day of interdisciplinary therapy in a comprehensive inpatient rehab setting. Physiatrist is providing close team supervision and 24 hour management of active medical problems listed below. Physiatrist and rehab team continue to assess barriers to discharge/monitor patient progress toward functional and medical goals.  Function:  Bathing Bathing position   Position: Shower  Bathing parts Body parts bathed by patient: Right arm, Left arm, Chest, Abdomen, Right upper leg, Left upper leg, Right lower leg, Left lower leg, Front perineal area, Buttocks, Back Body parts bathed by helper: Front perineal area, Buttocks  Bathing assist Assist Level: Supervision or verbal cues      Upper Body Dressing/Undressing Upper body dressing   What is the patient wearing?: Pull over shirt/dress     Pull over shirt/dress - Perfomed by patient: Thread/unthread right sleeve, Thread/unthread left sleeve, Put head through opening, Pull shirt over trunk Pull over shirt/dress - Perfomed by helper: Pull shirt over trunk        Upper body assist Assist Level: No help, No cues   Set up : To obtain clothing/put away  Lower Body Dressing/Undressing Lower body dressing Lower body dressing/undressing activity did not occur: N/A (pt completed task with nursing staff, secondary to time constraints) What is the patient wearing?: Socks, Shoes Underwear - Performed by patient: Thread/unthread right underwear  leg, Thread/unthread left underwear leg, Pull underwear up/down   Pants- Performed by patient: Thread/unthread right pants leg, Thread/unthread left pants leg, Pull pants up/down, Fasten/unfasten pants Pants- Performed  by helper: Pull pants up/down, Thread/unthread left pants leg, Thread/unthread right pants leg   Non-skid slipper socks- Performed by helper: Don/doff right sock, Don/doff left sock Socks - Performed by patient: Don/doff right sock, Don/doff left sock   Shoes - Performed by patient: Don/doff right shoe, Don/doff left shoe            Lower body assist Assist for lower body dressing: Assistive device, Set up Assistive Device Comment: long handled shoe horn, reacher, sock aid Set up : To obtain clothing/put away  Toileting Toileting Toileting activity did not occur: N/A (did not occur) Toileting steps completed by patient: Adjust clothing prior to toileting, Performs perineal hygiene, Adjust clothing after toileting Toileting steps completed by helper: Performs perineal hygiene Toileting Assistive Devices: Grab bar or rail  Toileting assist Assist level: Supervision or verbal cues   Transfers Chair/bed transfer   Chair/bed transfer method: Ambulatory Chair/bed transfer assist level: Supervision or verbal cues Chair/bed transfer assistive device: Armrests, Patent attorney     Max distance: 170 Assist level: Supervision or verbal cues   Wheelchair   Type: Manual Max wheelchair distance: 100 Assist Level: Supervision or verbal cues  Cognition Comprehension Comprehension assist level: Follows complex conversation/direction with no assist  Expression Expression assist level: Expresses complex 90% of the time/cues < 10% of the time  Social Interaction Social Interaction assist level: Interacts appropriately with others - No medications needed.  Problem Solving Problem solving assist level: Solves complex problems: With extra time  Memory Memory assist level: Complete Independence: No helper   Medical Problem List and Plan: 1. 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 -continue CIR therapies.  . - Weightbearing as tolerated LLE with anterior hip precautions 2. DVT Prophylaxis/Anticoagulation: Aspirin 325 mg twice a day.vasc study neg  3. Pain Management: Oxycodone and Robaxin as needed. 4. Acute blood loss anemia. Follow-up hgb 10.2 yest 5. Neuropsych: This patient is capable of making decisions on his own behalf. 6. Skin/Wound Care: Routine skin checks 7. Fluids/Electrolytes/Nutrition: Replacing potassium 8. Hypertension. Maxzide 1 tablet daily. Monitor with increased mobility 9. Diabetes mellitus of peripheral neuropathy. Hemoglobin A1c 6.6.Invokana 100 mg daily. Check blood sugars before meals and at bedtime. Diabetic teaching  -SSI at present  -hold on any changes at present 10. Morbid obesity. Dietary follow-up 11. Low grade temp/leukocytosis. Wound looks appropriate, chest clear  -dopplers negative  -ua neg, cx nex  -wbc's down to 10.5  -likely reactive to gout flare---rx'ed with colchicine x 2 days. ua level 8.3    12. Tachycardia  -likely related to ABLA and volume depletion---slowly improving  -continue to monitor.   -  Pushing fluids 13. Constipation/nausea  -moving bowels daily now  -sx improved LOS (Days) 6 A FACE TO FACE EVALUATION WAS PERFORMED  Ashlei Chinchilla T 07/30/2015 8:22 AM

## 2015-07-30 NOTE — Progress Notes (Signed)
Physical Therapy Session Note  Patient Details  Name: [name] MRN: [id number] Date of Birth: Feb 12, 1976  Today's Date: 07/30/2015 PT Individual Time: 1015-1110 PT Individual Time Calculation (min): 55 min   Short Term Goals: Week 1:  PT Short Term Goal 1 (Week 1): mod I for bed mobility with rails PT Short Term Goal 2 (Week 1): S for transfers bed to chair, chair to bed PT Short Term Goal 3 (Week 1): S for ambulation with rolling walker 150 feet.  PT Short Term Goal 4 (Week 1): Ascend/descend 1curb with rolling walker and S PT Short Term Goal 5 (Week 1): ascend/descend 4 stairs with B rails and min A Week 2:     Skilled Therapeutic Interventions/Progress Updates:    Patient received sitting up in [facility] and agreeable to PT. Patient performed gait training with RW and supervision A for 129f with one standing rest break, cues for improved posture and increased hip extension.   Supine therex.  SAQ. 2x 10  Gluet sets x 20  LLE AAROM SLR x 7  RLE SLR x 5  Attempted bridges x 5, unable to lift off table.   Cues from PT for improve quality of movement, proper lumbar positioning, and increased ROM for improved strength throughout movement, moderate response to cue noted.   Sit to stand from elevated seat height with push from mat table, cues for keep BLE even to encourage equal weight bearing.    Stair training on 6 inch and 4 inch steps x 8 at each height With supervision A and cues for improved posture and proper UE support. .   Gait training in hall for 190 ft with RW and supervision A back to room, Verbal and tactile instruction for improved posture and increased step length, mild improvement note in posture with cues that was not maintained for more than 10 steps.   [facility] supervision A and min cues for proper UE placement.  Throughout treatment session, patient performed sit to stand x 10 with supervision A with RW and  cues for even foot placement to improve equal weight bearing.  Patient was left in room in call bell within reach and all other needs met.   Therapy Documentation Precautions:  Precautions Precautions: Anterior Hip, Fall Precaution Comments:   Restrictions Weight Bearing Restrictions: Yes LLE Weight Bearing: Weight bearing as tolerated General:   Vital Signs:  Pain: Pain Assessment Pain Assessment: 0-10 Pain Score: 5  Pain Location: Hip Pain Orientation: Left Pain Descriptors / Indicators: Aching Pain Onset: On-going Patients Stated Pain Goal: 3 Pain Intervention(s): Other (Comment) (Reports RN administered pain meds 30 min prior to Treatment. )   See Function Navigator for Current Functional Status.   Therapy/Group: Individual Therapy  ALorie Phenix3/23/2017, 11:15 AM

## 2015-07-30 NOTE — Progress Notes (Signed)
Social Work Patient ID: [name], male   DOB: 09/13/1975, 40 y.o.   MRN: [id number]   Team conference information reviewed with pt. He is aware and agreeable with targeted d/c date of 3/28 but notes he has an appt already set with Dr. Magnus IvanBlackman on that day.  Per pt request, I have left a message with that office to see if appt can be changed or we might need to consider if pt could d/c end of day on 3/27 - will keep team posted.    Nicholas Pellecchia, LCSW

## 2015-07-30 NOTE — Progress Notes (Signed)
Occupational Therapy Session Note  Patient Details  Name: Nicholas RearDarius D Flink MRN: [id number] Date of Birth: 03/09/1976  Today's Date: 07/30/2015 OT Individual Time: 7829-56210815-0930 OT Individual Time Calculation (min): 75 min    Short Term Goals: Week 1:  OT Short Term Goal 1 (Week 1): STG=LTG secondary to short ELOS  Skilled Therapeutic Interventions/Progress Updates:    Pt resting in bed upon arrival eating breakfast.  Pt elected to bathe at sink this morning vs shower.  Pt amb in room to gather clothing prior to standing at sink to brush teeth and bathe periarea (approx 20 mins) before sitting in w/c to complete bathing tasks.  Pt requires more than a reasonable amount of time to complete tasks and is very deliberate and methodical.  Pt completed dressing tasks using AE appropriately for LB dressing tasks.  Pt required assistance with donning shoe on left foot.  Focus on continued activity tolerance, standing balance, functional amb with RW, and safety awareness to increase independence with BADLs.  Therapy Documentation Precautions:  Precautions Precautions: Anterior Hip, Fall Precaution Comments:   Restrictions Weight Bearing Restrictions: Yes LLE Weight Bearing: Weight bearing as tolerated  Pain: Pain Assessment Pain Assessment: 0-10 Pain Score: 5  Pain Location: Hip Pain Orientation: Left Pain Descriptors / Indicators: Aching Pain Onset: On-going Patients Stated Pain Goal: 3 Pain Intervention(s): Other (Comment) (Reports RN administered pain meds 30 min prior to Treatment. )  See Function Navigator for Current Functional Status.   Therapy/Group: Individual Therapy  Rich BraveLanier, Keen Ewalt Chappell 07/30/2015, 10:51 AM

## 2015-07-30 NOTE — Progress Notes (Signed)
Physical Therapy Session Note  Patient Details  Name: Nicholas RearDarius D Giuliano MRN: [id number] Date of Birth: 05/26/1975  Today's Date: 07/30/2015 PT Individual Time: 4098-11911515-1545 PT Individual Time Calculation (min): 30 min   Short Term Goals: Week 1:  PT Short Term Goal 1 (Week 1): mod I for bed mobility with rails PT Short Term Goal 2 (Week 1): S for transfers bed to chair, chair to bed PT Short Term Goal 3 (Week 1): S for ambulation with rolling walker 150 feet.  PT Short Term Goal 4 (Week 1): Ascend/descend 1curb with rolling walker and S PT Short Term Goal 5 (Week 1): ascend/descend 4 stairs with B rails and min A  Skilled Therapeutic Interventions/Progress Updates:   Patient in wheelchair, performed sit <> stand with increased time using RW from recliner and 22" mat table and ambulated to and from therapy gym using RW 2 x 170 ft with supervision. Instructed in BLE therex for strengthening and ROM using RW for UE support in standing: seated LAQ 2 x 10 each LE, standing marching 2 x 10 each LE, standing heel raises x 20, standing hip abduction 2 x 10 each LE. Patient left sitting in recliner with all needs in reach.   Therapy Documentation Precautions:  Precautions Precautions: Anterior Hip, Fall Precaution Comments:   Restrictions Weight Bearing Restrictions: Yes LLE Weight Bearing: Weight bearing as tolerated Pain: Pain Assessment Pain Assessment: 0-10 Pain Score: 5  Pain Type: Acute pain Pain Location: Foot (gout pain per patient) Pain Orientation: Left Pain Descriptors / Indicators: Aching Pain Onset: On-going Pain Intervention(s): Repositioned;Rest  See Function Navigator for Current Functional Status.   Therapy/Group: Individual Therapy  Kerney ElbeVarner, Aquan Kope A 07/30/2015, 3:49 PM

## 2015-07-30 NOTE — Progress Notes (Signed)
Occupational Therapy Note  Patient Details  Name: [name] MRN: [id number] Date of Birth: 03/02/1976  Today's Date: 07/30/2015 OT Individual Time: 1330-1400 OT Individual Time Calculation (min): 30 min   Pt c/o 4/10 pain in left hip; rest and repositioned Individual therapy  Pt resting in recliner upon arrival.  Pt engaged in bed mobility with mat set at height to approximate height of bed at home (supervision). Pt engaged in functional amb with RW for simple home mgmt tasks.  Pt required standing rest breaks X 2 during activities but remained standing/ambulating for approx 20 mins without sitting down.  Pt returned to room and recliner with all needs within reach.  Focus on safety awareness, activity tolerance, standing balance, and functional amb with RW.    Lavone NeriLanier, Andreya Lacks Jackson SouthChappell 07/30/2015, 2:46 PM

## 2015-07-30 NOTE — Patient Care Conference (Signed)
Inpatient RehabilitationTeam Conference and Plan of Care Update Date: 07/28/2015   Time: 2:45 PM    Patient Name: [name]      Medical Record Number: [id number]  Date of Birth: 12/27/1975 Sex: Male         Room/Bed: 4M06C/4M06C-01 Payor Info: Payor: BLUE CR[employer] BLUE [employer]IELD / Plan: [employer] OTHER / Product Type: *No Product type* /    Admitting Diagnosis: THA  Admit Date/Time:  07/24/2015  5:54 PM Admission Comments: No comment available   Primary Diagnosis:  Status post total replacement of left hip Principal Problem: Status post total replacement of left hip  Patient Active Problem List   Diagnosis Date Noted  . Osteoarthritis of right hip 07/24/2015  . Morbid obesity (HCC) 07/24/2015  . Osteoarthritis of left hip 07/21/2015  . Status post total replacement of left hip 07/21/2015  . Lumbar stenosis with neurogenic claudication 01/14/2014  . Spinal stenosis, lumbar region, with neurogenic claudication 07/30/2012    Expected Discharge Date: Expected Discharge Date: 08/04/15  Team Members Present: Physician leading conference: Dr. Faith RogueZachary Swartz Social Worker Present: Amada JupiterLucy Jeyson Deshotel, LCSW Nurse Present: Chana Bodeeborah Sharp, RN PT Present: Bayard Huggerebecca Varner, PT OT Present: Ardis Rowanom Lanier, COTA;Jennifer Katrinka BlazingSmith, OT SLP Present: Feliberto Gottronourtney Payne, SLP PPS Coordinator present : Tora DuckMarie Noel, RN, CRRN     Current Status/Progress Goal Weekly Team Focus  Medical   left tha, morbidly obese. constipation. abla  improve pain control,   fever, constipation, pain   Bowel/Bladder   continent of B&B, lbm 3/19  remain continent with regular bms  continue with medications, report constipation   Swallow/Nutrition/ Hydration             ADL's   functional transfers-steady A; UB bathing/dressing-supervision; LB bathing/dressing-mod A/max A; decreased activity tolerance  mod I overall, shower transfer-supervision  funcitonal transfers/ambulation; LB bathing/dressing; activity tolerance   Mobility   supervision   mod I except supervision stairs  functional mobility training, strengthening, activity tolerance, pt education   Communication             Safety/Cognition/ Behavioral Observations            Pain   left hip(surgical pain), Oxy prn & robaxin prn  pain will be adequately controlled  assess pain frequently & offer prn pain meds    Skin   skin intact except for left hip surgical incision  will remain free of skin breakdown  assess skin, monitor surgical site, encourage patient to report any unusual sensation to area    Rehab Goals Patient on target to meet rehab goals: Yes *See Care Plan and progress notes for long and short-term goals.  Barriers to Discharge: morbid obesity, arthritic right hip    Possible Resolutions to Barriers:  adaptive techniques, measures, pain control    Discharge Planning/Teaching Needs:  Home with wife who works Mon - Fri full-time.  Only intermittent support available.  TBD as pt with mod ind goals.   Team Discussion:  Pt making good gains with tx and anticipate mod i goals at d/c.  No concerns.  Revisions to Treatment Plan:  None   Continued Need for Acute Rehabilitation Level of Care: The patient requires daily medical management by a physician with specialized training in physical medicine and rehabilitation for the following conditions: Daily direction of a multidisciplinary physical rehabilitation program to ensure safe treatment while eliciting the highest outcome that is of practical value to the patient.: Yes Daily medical management of patient stability for increased activity during participation  in an intensive rehabilitation regime.: Yes Daily analysis of laboratory values and/or radiology reports with any subsequent need for medication adjustment of medical intervention for : Post surgical problems;Other  Kirat Mezquita 07/30/2015, 5:16 PM

## 2015-07-31 ENCOUNTER — Inpatient Hospital Stay (HOSPITAL_COMMUNITY): Payer: BLUE CROSS/BLUE SHIELD | Admitting: Physical Therapy

## 2015-07-31 ENCOUNTER — Inpatient Hospital Stay (HOSPITAL_COMMUNITY): Payer: BLUE CROSS/BLUE SHIELD | Admitting: Occupational Therapy

## 2015-07-31 LAB — GLUCOSE, CAPILLARY
GLUCOSE-CAPILLARY: 191 mg/dL — AB (ref 65–99)
GLUCOSE-CAPILLARY: 207 mg/dL — AB (ref 65–99)
Glucose-Capillary: 132 mg/dL — ABNORMAL HIGH (ref 65–99)
Glucose-Capillary: 121 mg/dL — ABNORMAL HIGH (ref 65–99)

## 2015-07-31 NOTE — Progress Notes (Signed)
PHYSICAL MEDICINE & REHABILITATION     PROGRESS NOTE    Subjective/Complaints: Looks a little flat today. States that he's just anxious to go home. Otherwise doing ok  ROS: Pt denies fever, rash , headache, blurred or double vision,   vomiting, abdominal pain, diarrhea, chest pain, shortness of breath, palpitations, dysuria, dizziness, neck or back pain, bleeding, anxiety, or depression  Objective: Vital Signs: Blood pressure 111/71, pulse 105, temperature 98.2 F (36.8 C), temperature source Oral, resp. rate 20, height  (1.88 m), weight 146.875 kg (323 lb 12.8 oz), SpO2 97 %. No results found. No results for input(s): WBC, HGB, HCT, PLT in the last 72 hours. No results for input(s): NA, K, CL, GLUCOSE, BUN, CREATININE, CALCIUM in the last 72 hours.  Invalid input(s): CO CBG (last 3)   Recent Labs  07/30/15 1650 07/30/15 2045 07/31/15 0648  GLUCAP 132* 221* 132*    Wt Readings from Last 3 Encounters:  07/29/15 146.875 kg (323 lb 12.8 oz)  07/21/15 155.584 kg (343 lb)  07/14/15 137.44 kg (303 lb)    Physical Exam:  Constitutional: He is oriented to person, place, and time.  40 year old right-handed morbidly obese male  HENT: oral mucosa pink and moist Head: Normocephalic.  Eyes: EOM are normal.  Neck: Normal range of motion. Neck supple. No thyromegaly present.  Cardiovascular: Normal rate and regular rhythm.  Respiratory: Effort normal and breath sounds normal. No respiratory distress.  GI: Soft. Bowel sounds are normal. He exhibits no distension. No tenderness Musculoskeletal:  Left hip limited ROM due to pain. Continued tenderness with AROM/PROM Right hip. bilateral MTP's less tender with palpation. No redness or warmth seen in the feet---may have some swelling at MTP's. Knees are unremarkable Neurological: He is alert and oriented to person, place, and time.  5/5 UE. LLE 1/5 HF, ke and 3/5 adf/pf. RLE: 2/5 hf, k3 and 3/5 adf/pf. No sensory  deficits.  Skin:  Hip incision is dressed, clean without  drainage or warmth.  Psychiatric: He has a normal mood and affect. His behavior is normal   Assessment/Plan: 1. Functional and mobility deficits secondary to OA left hip, left THA which require 3+ hours per day of interdisciplinary therapy in a comprehensive inpatient rehab setting. Physiatrist is providing close team supervision and 24 hour management of active medical problems listed below. Physiatrist and rehab team continue to assess barriers to discharge/monitor patient progress toward functional and medical goals.  Function:  Bathing Bathing position   Position: Wheelchair/chair at sink  Bathing parts Body parts bathed by patient: Right arm, Left arm, Chest, Abdomen, Right upper leg, Left upper leg, Front perineal area, Buttocks Body parts bathed by helper: Front perineal area, Buttocks  Bathing assist Assist Level: Supervision or verbal cues      Upper Body Dressing/Undressing Upper body dressing   What is the patient wearing?: Pull over shirt/dress     Pull over shirt/dress - Perfomed by patient: Thread/unthread right sleeve, Thread/unthread left sleeve, Put head through opening, Pull shirt over trunk Pull over shirt/dress - Perfomed by helper: Pull shirt over trunk        Upper body assist Assist Level: No help, No cues   Set up : To obtain clothing/put away  Lower Body Dressing/Undressing Lower body dressing Lower body dressing/undressing activity did not occur: N/A (pt completed task with nursing staff, secondary to time constraints) What is the patient wearing?: Underwear, Pants, Socks, Shoes Underwear - Performed by patient: Thread/unthread right underwear leg, Thread/unthread  left underwear leg, Pull underwear up/down   Pants- Performed by patient: Thread/unthread right pants leg, Thread/unthread left pants leg, Pull pants up/down, Fasten/unfasten pants Pants- Performed by helper: Pull pants up/down,  Thread/unthread left pants leg, Thread/unthread right pants leg   Non-skid slipper socks- Performed by helper: Don/doff right sock, Don/doff left sock Socks - Performed by patient: Don/doff right sock, Don/doff left sock   Shoes - Performed by patient: Don/doff left shoe Shoes - Performed by helper: Don/doff right shoe          Lower body assist Assist for lower body dressing: Touching or steadying assistance (Pt > 75%) Assistive Device Comment: long handled shoe horn, reacher, sock aid Set up : To obtain clothing/put away  Toileting Toileting Toileting activity did not occur: N/A (did not occur) Toileting steps completed by patient: Adjust clothing prior to toileting, Performs perineal hygiene, Adjust clothing after toileting Toileting steps completed by helper: Performs perineal hygiene Toileting Assistive Devices: Grab bar or rail  Toileting assist Assist level: Supervision or verbal cues   Transfers Chair/bed transfer   Chair/bed transfer method: Ambulatory Chair/bed transfer assist level: Supervision or verbal cues Chair/bed transfer assistive device: Armrests, Patent attorneyWalker     Locomotion Ambulation     Max distance: 170 Assist level: Supervision or verbal cues   Wheelchair   Type: Manual Max wheelchair distance: 100 Assist Level: Supervision or verbal cues  Cognition Comprehension Comprehension assist level: Follows complex conversation/direction with no assist  Expression Expression assist level: Expresses complex ideas: With extra time/assistive device  Social Interaction Social Interaction assist level: Interacts appropriately with others - No medications needed.  Problem Solving Problem solving assist level: Solves complex problems: With extra time  Memory Memory assist level: Complete Independence: No helper   Medical Problem List and Plan: 1. 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 -continue CIR therapies.  Encouraged him to speak with therapists regarding how he's doing in meeting dc goals - Weightbearing as tolerated LLE with anterior hip precautions 2. DVT Prophylaxis/Anticoagulation: Aspirin 325 mg twice a day.vasc study neg  3. Pain Management: Oxycodone and Robaxin as needed. 4. Acute blood loss anemia. Follow-up hgb 10.2   5. Neuropsych: This patient is capable of making decisions on his own behalf. 6. Skin/Wound Care: Routine skin checks 7. Fluids/Electrolytes/Nutrition: Replacing potassium 8. Hypertension. Maxzide 1 tablet daily. Monitor with increased mobility 9. Diabetes mellitus of peripheral neuropathy. Hemoglobin A1c 6.6.Invokana 100 mg daily. Check blood sugars before meals and at bedtime. Diabetic teaching  -SSI at present  -good control at present 10. Morbid obesity. Dietary follow-up 11. Low grade temp/leukocytosis. Wound looks appropriate, chest clear  -dopplers negative  -ua neg, cx nex  -wbc's down to 10.5  -likely reactive to gout flare---rx'ed with colchicine x 2 days. ua level 8.3    12. Tachycardia  -likely related to ABLA and volume depletion---slowly improving  -continue to monitor.   -  Pushing fluids 13. Constipation/nausea  -moving bowels daily now  -sx improved LOS (Days) 7 A FACE TO FACE EVALUATION WAS PERFORMED  [name] T 07/31/2015 9:00 AM

## 2015-07-31 NOTE — Progress Notes (Signed)
Occupational Therapy Session Note  Patient Details  Name: [name] MRN: [id number] Date of Birth: 02/10/1976  Today's Date: 07/31/2015 OT Individual Time:  -   0930-1045  (75 min)  1st session                                           1415- 1535  (80 min)  2nd session      Short Term Goals: Week 1:  OT Short Term Goal 1 (Week 1): STG=LTG secondary to short ELOS      Skilled Therapeutic Interventions/Progress Updates:    1st session:  sitting in wc upon arrival.  PPt agreed to shower.  Ambulated to toilet with RW with SBA.  ABle to clean peri area after toileting.  Pt needed increased time for toileting.  Ambulated to shower with RW and SBA.  Pt bathed with no assistance.  He ambulated out to bed and completed dressing at SBA level.    Pt requires more than a reasonable amount of time to complete all tasks due to being thorough and methodical. Pt completed dressing tasks using AE appropriately for LB dressing tasks. Pt required no assistance with donning shower shoes.  Focus on continued activity tolerance, standing balance, functional amb with RW, and safety awareness to increase independence with BADLs.  2nd session:  Pt agreed to do laundry and then do functional mobility outside.  Ppt propelled wc to laundry area but unable to complete task due to washing machine in use.  Propelled wc outside to gift shop.  Ambulated on sidewalk with 5 degrees incline with RW and close supervision for 225 feet before taking rest break.  Pt. Ambulated another 100 feet after rest break.  Propelled wc to atrium.  OT propelled wc up to room.  Ppt ambulated to bathroom with RW and performed toileting with distance supervision.  Therapy Documentation Precautions:  Precautions Precautions: Anterior Hip, Fall Precaution Comments:   Restrictions Weight Bearing Restrictions: Yes LLE Weight Bearing: Weight bearing as tolerated   :           See Function Navigator for Current Functional  Status.   Therapy/Group: Individual Therapy  [name], [name] 07/31/2015, 7:58 AM

## 2015-07-31 NOTE — Progress Notes (Signed)
Physical Therapy Weekly Progress Note  Patient Details  Name: [name] MRN: [id number] Date of Birth: Jul 10, 1975  Beginning of progress report period: July 25, 2015 End of progress report period: July 31, 2015  Today's Date: 07/31/2015 PT Individual Time: 1100-1200 PT Individual Time Calculation (min): 60 min   Patient has met 5 of 5 short term goals. Patient making steady progress this week but remains limited by premorbid gout pain in BLE and L hip pain.   Patient continues to demonstrate the following deficits: pain, decreased strength, decreased ROM, and decreased activity tolerance and therefore will continue to benefit from skilled PT intervention to enhance overall performance with activity tolerance, balance, functional use of  right lower extremity and left lower extremity and knowledge of precautions.  Patient progressing toward long term goals..  Continue plan of care.  PT Short Term Goals Week 1:  PT Short Term Goal 1 (Week 1): mod I for bed mobility with rails PT Short Term Goal 1 - Progress (Week 1): Met PT Short Term Goal 2 (Week 1): S for transfers bed to chair, chair to bed PT Short Term Goal 2 - Progress (Week 1): Met PT Short Term Goal 3 (Week 1): S for ambulation with rolling walker 150 feet.  PT Short Term Goal 3 - Progress (Week 1): Met PT Short Term Goal 4 (Week 1): Ascend/descend 1curb with rolling walker and S PT Short Term Goal 4 - Progress (Week 1): Met PT Short Term Goal 5 (Week 1): ascend/descend 4 stairs with B rails and min A PT Short Term Goal 5 - Progress (Week 1): Met Week 2:  PT Short Term Goal 1 (Week 2): = LTGs due to anticipated LOS  Skilled Therapeutic Interventions/Progress Updates:   Patient sitting in wheelchair, reporting increased hip stiffness this AM. Session focused on functional transfers, gait, strengthening, AROM, and activity tolerance. Gait training using RW 2 x 170 ft with mod I, slightly improved upright posture noted  compared to previous sessions. Performed simulated car transfer to sedan height using RW with greatly increased time and effort and supervision. Performed sit <> supine with pillow between knees to maintain precautions with mod I on mat table. Patient negotiated up/down curb step using RW with supervision and min cues for technique.   BLE therex for strengthening and ROM: seated LAQ 2 x 10 each LE, ankle pumps x 20, supine heel slides x 20 each LE, SAQ over bolster x 20 each LE, glute sets x 20, supine hip abduction/adduction to neutral with maxislide x 20 each LE within small ROM LLE.   BUE therex with 1.5 lb weights x 20 each exercise: chest fly, chest press, wrist flexion curls, hammer curl, tricep press.     Patient made mod I in controlled environment of room, demonstrating good safety awareness of when to call for assistance as needed.   Therapy Documentation Precautions:  Precautions Precautions: Anterior Hip, Fall Precaution Comments:   Restrictions Weight Bearing Restrictions: Yes LLE Weight Bearing: Weight bearing as tolerated Pain: Pain Assessment Pain Assessment: 0-10 Pain Score: 7  Pain Type: Acute pain;Surgical pain Pain Location: Hip Pain Orientation: Left Pain Descriptors / Indicators: Aching;Guarding;Discomfort;Grimacing Pain Onset: With Activity Pain Intervention(s): Rest;Repositioned   See Function Navigator for Current Functional Status.  Therapy/Group: Individual Therapy  Laretta Alstrom 07/31/2015, 12:10 PM

## 2015-08-01 ENCOUNTER — Inpatient Hospital Stay (HOSPITAL_COMMUNITY): Payer: BLUE CROSS/BLUE SHIELD | Admitting: Occupational Therapy

## 2015-08-01 DIAGNOSIS — E118 Type 2 diabetes mellitus with unspecified complications: Secondary | ICD-10-CM

## 2015-08-01 LAB — GLUCOSE, CAPILLARY
GLUCOSE-CAPILLARY: 138 mg/dL — AB (ref 65–99)
Glucose-Capillary: 212 mg/dL — ABNORMAL HIGH (ref 65–99)
Glucose-Capillary: 104 mg/dL — ABNORMAL HIGH (ref 65–99)
Glucose-Capillary: 200 mg/dL — ABNORMAL HIGH (ref 65–99)

## 2015-08-01 MED ORDER — OXYCODONE HCL 5 MG PO TABS
5.0000 mg | ORAL_TABLET | ORAL | Status: DC | PRN
  Administered 2015-08-01 – 2015-08-03 (×6): 15 mg via ORAL
  Filled 2015-08-01 (×6): qty 3

## 2015-08-01 NOTE — Progress Notes (Signed)
[name] is a 40 y.o. male [birth year] [id number]  Subjective: No new complaints. No new problems. Slept well. Feeling OK.  Objective: Vital signs in last 24 hours: Temp:  [98.1 F (36.7 C)] 98.1 F (36.7 C) (03/25 0630) Pulse Rate:  [93-101] 93 (03/25 0630) Resp:  [18-19] 18 (03/25 0630) BP: (100-119)/(71-73) 119/73 mmHg (03/25 0630) SpO2:  [95 %-100 %] 100 % (03/25 0630) Weight change:  Last BM Date: 07/31/15  Intake/Output from previous day: 03/24 0701 - 03/25 0700 In: 480 [P.O.:480] Out: -  Last cbgs: CBG (last 3)   Recent Labs  07/31/15 2100 08/01/15 0635 08/01/15 1126  GLUCAP 207* 138* 212*     Physical Exam General: No apparent distress   HEENT: not dry Lungs: Normal effort. Lungs clear to auscultation, no crackles or wheezes. Cardiovascular: Regular rate and rhythm, no edema Abdomen: S/NT/ND; BS(+) Musculoskeletal:  unchanged Neurological: No new neurological deficits Wounds: clean    Skin: clear  Aging changes Mental state: Alert, oriented, cooperative In a w/c    Lab Results: BMET    Component Value Date/Time   NA 137 07/27/2015 0641   NA 133* 09/18/2011 1355   K 3.3* 07/27/2015 0641   K 4.0 09/18/2011 1355   CL 94* 07/27/2015 0641   CL 98 09/18/2011 1355   CO2 32 07/27/2015 0641   CO2 19* 09/18/2011 1355   GLUCOSE 191* 07/27/2015 0641   GLUCOSE 503* 09/18/2011 1355   BUN 23* 07/27/2015 0641   BUN 18 09/18/2011 1355   CREATININE 1.05 07/27/2015 0641   CREATININE 1.06 09/18/2011 1355   CALCIUM 8.9 07/27/2015 0641   CALCIUM 8.4* 09/18/2011 1355   GFRNONAA >60 07/27/2015 0641   GFRNONAA >60 09/18/2011 1355   GFRAA >60 07/27/2015 0641   GFRAA >60 09/18/2011 1355   CBC    Component Value Date/Time   WBC 10.5 07/27/2015 0641   RBC 3.44* 07/27/2015 0641   HGB 10.2* 07/27/2015 0641   HCT 31.6* 07/27/2015 0641   PLT 474* 07/27/2015 0641   MCV 91.9 07/27/2015 0641   MCH 29.7 07/27/2015 0641   MCHC 32.3 07/27/2015 0641   RDW 13.2  07/27/2015 0641   LYMPHSABS 3.3 01/14/2014 0653   MONOABS 0.7 01/14/2014 0653   EOSABS 0.4 01/14/2014 0653   BASOSABS 0.1 01/14/2014 0653    Studies/Results: No results found.  Medications: I have reviewed the patient's current medications.  Assessment/Plan:   1. 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 -continue CIR therapies. Encouraged him to speak with therapists regarding how he's doing in meeting dc goals - Weightbearing as tolerated LLE with anterior hip precautions 2. DVT Prophylaxis/Anticoagulation: Aspirin 325 mg twice a day.  3. Pain Management: Oxycodone and Robaxin as needed. 4. Acute blood loss anemia. Follow-up hgb 10.2  5. Neuropsych: This patient is capable of making decisions on his own behalf. 6. Skin/Wound Care: Routine skin checks 7. Fluids/Electrolytes/Nutrition: Replacing potassium 8. Hypertension. Maxzide 1 tablet daily. Monitor with increased mobility 9. Diabetes mellitus of peripheral neuropathy. Hemoglobin A1c 6.6 Invokana 100 mg daily. Check blood sugars before meals and at bedtime. Diabetic teaching -SSI at present -good control at present 10. Morbid obesity. Dietary follow-up 11. Low grade temp/leukocytosis. Wound looks appropriate, chest clear -dopplers negative -ua neg, cx nex -wbc's down to 10.5 -likely reactive to gout flare---rx'ed with colchicine  12. Tachycardia -likely related to ABLA and volume depletion---slowly improving -continue to monitor.  - Pushing fluids  13. Constipation/nausea -moving bowels daily now -sx improved Length of stay, days: 8  Sonda PrimesAlex Plotnikov , MD 08/01/2015, 12:26 PM

## 2015-08-02 ENCOUNTER — Inpatient Hospital Stay (HOSPITAL_COMMUNITY): Payer: Self-pay | Admitting: Physical Therapy

## 2015-08-02 LAB — GLUCOSE, CAPILLARY
GLUCOSE-CAPILLARY: 186 mg/dL — AB (ref 65–99)
GLUCOSE-CAPILLARY: 119 mg/dL — AB (ref 65–99)
GLUCOSE-CAPILLARY: 183 mg/dL — AB (ref 65–99)
Glucose-Capillary: 156 mg/dL — ABNORMAL HIGH (ref 65–99)

## 2015-08-02 NOTE — Discharge Summary (Signed)
NAMMarland Kitchen:  [name], [name]               ACCOUNT NO.:  [id number]  MEDICAL RECORD NO.:  [id number]  LOCATION:  4M06C                        FACILITY:  [facility]  PHYSICIAN:  Ranelle OysterZachary T. Swartz, M.D.DATE OF BIRTH:  [birth year]  DATE OF ADMISSION:  07/23/2015 DATE OF DISCHARGE:  08/03/2015                              DISCHARGE SUMMARY   DISCHARGE DIAGNOSES: 1. Functional immobility secondary to advanced osteoarthritis of the     left hip status post left total hip arthroplasty on July 21, 2015,     with morbid obesity. 2. Aspirin 325 mg twice daily for deep venous thrombosis prophylaxis. 3. Pain management. 4. Acute blood loss anemia. 5. Hypertension. 6. Diabetes mellitus with peripheral neuropathy. 7. Morbid obesity.  HISTORY OF PRESENT ILLNESS:  This is a 40 year old right-handed male, history of morbid obesity, 343 pounds, lives with his wife, independent prior to admission using a walker.  One level apartment with level entry.  Presented on July 21, 2015, with functional deficits secondary to left hip pain due to severe osteoarthritis.  No change with conservative care.  Underwent left total hip arthroplasty anterior approach on July 21, 2015, per Dr. Magnus IvanBlackman.  Weightbearing as tolerated with anterior hip precautions.  Hospital course, pain management.  Acute blood loss anemia 10.2 and monitored.  Aspirin therapy 325 mg twice daily for DVT prophylaxis.  Physical and occupational therapy ongoing.  The patient was admitted for comprehensive rehab program.  PAST MEDICAL HISTORY:  See discharge diagnoses.  SOCIAL HISTORY:  He lives with spouse, used a cane walker prior to admission.  Functional status upon admission to Rehab Services, minimal assist, ambulate 80 feet rolling walker; moderate assist, sit to stand; mod max assist, activities of daily living.  PHYSICAL EXAMINATION:  VITAL SIGNS:  Blood pressure 110/80, pulse 120, temperature 99, respirations 18. GENERAL:  This was an  alert male, in no acute distress, oriented x3. LUNGS:  Clear to auscultation without wheeze. CARDIAC:  Regular rate and rhythm.  No murmur. ABDOMEN:  Soft, nontender.  Good bowel sounds. PELVIC:  Hip incision clean and dry, appropriately tender.  REHABILITATION HOSPITAL COURSE:  The patient was admitted to Inpatient Rehab Services with therapies initiated on a 3-hour daily basis, consisting of physical therapy, occupational therapy, and rehabilitation nursing.  The following issues were addressed during patient's rehabilitation stay.  Pertaining to [name]'s left hip total hip arthroplasty, surgical site healing nicely, weightbearing as tolerated, and anterior hip precautions, he would follow up Orthopedic Service, Dr. Magnus IvanBlackman.  Aspirin therapy 325 mg twice daily for DVT prophylaxis. Venous Doppler study is negative.  Pain management with the use of oxycodone and Robaxin with good results.  Acute blood loss anemia stable.  No bleeding episodes.  Blood pressure monitored with Maxzide. He would follow up with his primary care physician.  Diabetes mellitus, peripheral neuropathy.  Hemoglobin A1c of 6.6.  Blood sugars remained within acceptable limits.  Full diabetic teaching completed.  Morbid obesity, 343 pounds, and received instructions from dietary services on weight loss diet.  Bouts of constipation resolved with laxative assistance.  The patient received weekly collaborative interdisciplinary team conferences to discuss estimated length of stay, family teaching, and any barriers to his  discharge.  He is ambulating 170 feet rolling walker, x2 modified independent.  Performed simulated car transfers using rolling walker with some increased time to complete task and supervision.  Performed sit to supine with pillow between his knees to maintain hip precautions, modified independence.  He could gather his belongings for activities of daily living and homemaking.  Ambulated to the  shower with rolling walker and standby assistance.  Required some assistance for lower body dressing.  Full family teaching was completed and plan, discharge to home.  DISCHARGE MEDICATIONS:  Included: 1. Aspirin 325 mg p.o. twice daily. 2. Invokana 100 mg p.o. daily. 3. Robaxin 500 mg p.o. every 6 hours as needed muscle spasms, dispense     of 90 tablets. 4. Oxycodone immediate release 5-15 mg every 4 hours as needed pain,     dispense of 90 tablets. 5. MiraLAX 17 g p.o. daily, hold for loose stools. 6. Maxzide 75-50 mg 1 tablet daily.  FOLLOWUP INSTRUCTIONS:  The patient would follow up Vanita Panda. Magnus Ivan, M.D. of Orthopedic Services, call for appointment 2 weeks; Toy Cookey, MD, medical management 2 weeks; Ranelle Oyster, M.D. at the Outpatient Rehab Service office as needed.  DIET:  The patient's diet was regular.  INSTRUCTIONS:  He would continue to maintain anterior hip precautions, weightbearing as tolerated.     Mariam Dollar, P.A.   ______________________________ Ranelle Oyster, M.D.    DA/MEDQ  D:  08/02/2015  T:  08/02/2015  Job:  [id number]  cc:   Vanita Panda. Magnus Ivan, M.D. Toy Cookey, MD

## 2015-08-02 NOTE — Discharge Summary (Signed)
discharge summary job # [id number]

## 2015-08-02 NOTE — Progress Notes (Signed)
Physical Therapy Session Note  Patient Details  Name: [name] MRN: [id number] Date of Birth: Feb 17, 1976  Today's Date: 08/02/2015 PT Individual Time: 1500-1530 PT Individual Time Calculation (min): 30 min  and Today's Date: 08/02/2015    Short Term Goals: Week 1:  PT Short Term Goal 1 (Week 1): mod I for bed mobility with rails PT Short Term Goal 1 - Progress (Week 1): Met PT Short Term Goal 2 (Week 1): S for transfers bed to chair, chair to bed PT Short Term Goal 2 - Progress (Week 1): Met PT Short Term Goal 3 (Week 1): S for ambulation with rolling walker 150 feet.  PT Short Term Goal 3 - Progress (Week 1): Met PT Short Term Goal 4 (Week 1): Ascend/descend 1curb with rolling walker and S PT Short Term Goal 4 - Progress (Week 1): Met PT Short Term Goal 5 (Week 1): ascend/descend 4 stairs with B rails and min A PT Short Term Goal 5 - Progress (Week 1): Met Week 2:  PT Short Term Goal 1 (Week 2): = LTGs due to anticipated LOS  Skilled Therapeutic Interventions/Progress Updates:   Pt benefits from IT band release for improved pain in session, so pt educated on self release. Pt with difficulty carrying over cues for upright posture in session though able to partially attain in static standing. Pt would continue to benefit from skilled PT services to increase functional mobility.  Therapy Documentation Precautions:  Precautions Precautions: Anterior Hip, Fall Precaution Comments:   Restrictions Weight Bearing Restrictions: Yes LLE Weight Bearing: Weight bearing as tolerated Vital Signs: Therapy Vitals Temp: 98 F (36.7 C) Temp Source: Oral Pulse Rate: (!) 103 Resp: 18 BP: 122/75 mmHg Patient Position (if appropriate): Sitting Oxygen Therapy SpO2: 98 % O2 Device: Not Delivered Pain: Pain Assessment Pain Assessment: 0-10 Pain Score: 5  Pain Location: Hip Mobility:  SBA with cues for weight shift Locomotion :   Mod I with RW Other Treatments:  IT band release  performed by therapist followed by self golf ball release by pt. Pt performs standing marching, heel raises, seated knee flexion, standing weight shifts B/L and hip add isometrics 2x10. Static standing 1'x2. Pt educated on hip precautions, rehab plan, and rehab timeline. Facilitation of upright posture in static standing.   See Function Navigator for Current Functional Status.   Therapy/Group: Individual Therapy  Monia Pouch 08/02/2015, 4:15 PM

## 2015-08-02 NOTE — Progress Notes (Signed)
[name] is a 40 y.o. male 03/23/1976 [id number]  Subjective: No new complaints. No new problems. Slept well. Pain is better Objective: Vital signs in last 24 hours: Temp:  [98.6 F (37 C)-98.7 F (37.1 C)] 98.7 F (37.1 C) (03/26 0555) Pulse Rate:  [97-98] 98 (03/26 0555) Resp:  [18] 18 (03/26 0555) BP: (105-109)/(65) 105/65 mmHg (03/26 0555) SpO2:  [97 %-98 %] 98 % (03/26 0555) Weight change:  Last BM Date: 08/01/15  Intake/Output from previous day: 03/25 0701 - 03/26 0700 In: 1200 [P.O.:1200] Out: 1075 [Urine:1075] Last cbgs: CBG (last 3)   Recent Labs  08/01/15 1627 08/01/15 2112 08/02/15 0708  GLUCAP 104* 200* 156*     Physical Exam General: NAD   HEENT: not dry Lungs: Normal effort. Lungs clear to auscultation, no crackles or wheezes. Cardiovascular: Regular rate and rhythm, no edema Abdomen: S/NT/ND; BS(+) Musculoskeletal:  unchanged Neurological: No new neurological deficits Wounds: clean    Skin: clear   Mental state: Alert, oriented, cooperative In a w/c    Lab Results: BMET    Component Value Date/Time   NA 137 07/27/2015 0641   NA 133* 09/18/2011 1355   K 3.3* 07/27/2015 0641   K 4.0 09/18/2011 1355   CL 94* 07/27/2015 0641   CL 98 09/18/2011 1355   CO2 32 07/27/2015 0641   CO2 19* 09/18/2011 1355   GLUCOSE 191* 07/27/2015 0641   GLUCOSE 503* 09/18/2011 1355   BUN 23* 07/27/2015 0641   BUN 18 09/18/2011 1355   CREATININE 1.05 07/27/2015 0641   CREATININE 1.06 09/18/2011 1355   CALCIUM 8.9 07/27/2015 0641   CALCIUM 8.4* 09/18/2011 1355   GFRNONAA >60 07/27/2015 0641   GFRNONAA >60 09/18/2011 1355   GFRAA >60 07/27/2015 0641   GFRAA >60 09/18/2011 1355   CBC    Component Value Date/Time   WBC 10.5 07/27/2015 0641   RBC 3.44* 07/27/2015 0641   HGB 10.2* 07/27/2015 0641   HCT 31.6* 07/27/2015 0641   PLT 474* 07/27/2015 0641   MCV 91.9 07/27/2015 0641   MCH 29.7 07/27/2015 0641   MCHC 32.3 07/27/2015 0641   RDW 13.2  07/27/2015 0641   LYMPHSABS 3.3 01/14/2014 0653   MONOABS 0.7 01/14/2014 0653   EOSABS 0.4 01/14/2014 0653   BASOSABS 0.1 01/14/2014 0653    Studies/Results: No results found.  Medications: I have reviewed the patient's current medications.  Assessment/Plan:   1. 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 -continue CIR therapies. Encouraged him to speak with therapists regarding how he's doing in meeting dc goals - Weight bearing as tolerated LLE with anterior hip precautions 2. DVT Prophylaxis/Anticoagulation: Aspirin 325 mg twice a day.  3. Pain Management: Oxycodone and Robaxin as needed. 4. Acute blood loss anemia. Follow-up hgb 10.2  5. Neuropsych: This patient is capable of making decisions on his own behalf. 6. Skin/Wound Care: Routine skin checks 7. Fluids/Electrolytes/Nutrition: Replacing potassium 8. Hypertension. Maxzide 1 tablet daily. Monitor with increased mobility 9. Diabetes mellitus of peripheral neuropathy. Hemoglobin A1c 6.6 Invokana 100 mg daily. Check blood sugars before meals and at bedtime. Diabetic teaching -SSI at present -good control at present 10. Morbid obesity. Dietary follow-up 11. Low grade temp/leukocytosis. Wound looks appropriate, chest clear -dopplers negative -ua neg, cx nex -wbc's down to 10.5 -likely reactive to gout flare---rx'ed with colchicine  12. Tachycardia -likely related to ABLA and volume depletion---slowly improving -continue to monitor.  - Pushing  fluids 13. Constipation/nausea -moving bowels daily now -sx improved Length of stay, days: 9   Cont current Rx  Sonda Primes , MD 08/02/2015, 11:05 AM

## 2015-08-03 ENCOUNTER — Inpatient Hospital Stay (HOSPITAL_COMMUNITY): Payer: BLUE CROSS/BLUE SHIELD | Admitting: Physical Therapy

## 2015-08-03 ENCOUNTER — Inpatient Hospital Stay (HOSPITAL_COMMUNITY): Payer: Self-pay

## 2015-08-03 LAB — GLUCOSE, CAPILLARY
GLUCOSE-CAPILLARY: 182 mg/dL — AB (ref 65–99)
Glucose-Capillary: 132 mg/dL — ABNORMAL HIGH (ref 65–99)

## 2015-08-03 MED ORDER — OXYCODONE HCL 5 MG PO TABS: 5.0000 mg | ORAL_TABLET | ORAL | Status: DC | PRN

## 2015-08-03 MED ORDER — TRIAMTERENE-HCTZ 75-50 MG PO TABS: 1.0000 | ORAL_TABLET | Freq: Every day | ORAL | Status: DC

## 2015-08-03 MED ORDER — CANAGLIFLOZIN 100 MG PO TABS: 100.0000 mg | ORAL_TABLET | Freq: Every day | ORAL | Status: DC

## 2015-08-03 MED ORDER — POLYETHYLENE GLYCOL 3350 17 G PO PACK: 17.0000 g | PACK | Freq: Every day | ORAL | Status: DC

## 2015-08-03 MED ORDER — METHOCARBAMOL 500 MG PO TABS: 500.0000 mg | ORAL_TABLET | Freq: Four times a day (QID) | ORAL | Status: DC | PRN

## 2015-08-03 NOTE — Progress Notes (Signed)
Social Work  Discharge Note  The overall goal for the admission was met for:   Discharge location: Yes - home with wife who can provide intermittent assist  Length of Stay: Yes - 10 days  Discharge activity level: Yes - modified independent  Home/community participation: Yes  Services provided included: MD, RD, PT, OT, RN, TR, Pharmacy and SW  Financial Services: Private Insurance: [employer] [place]  Follow-up services arranged: Home Health: PT via [facility] and Patient/Family has no preference for HH/DME agencies  Comments (or additional information):  Patient/Family verbalized understanding of follow-up arrangements: Yes  Individual responsible for coordination of the follow-up plan: pt  Confirmed correct DME delivered: NA -had all needed DME    Meloney Feld

## 2015-08-03 NOTE — Progress Notes (Signed)
Occupational Therapy Session Note  Patient Details  Name: Nicholas RearDarius D Mccaffery MRN: [id number] Date of Birth: 01/11/1976  Today's Date: 08/03/2015 OT Individual Time: 0900-1000 OT Individual Time Calculation (min): 60 min    Short Term Goals: Week 1:  OT Short Term Goal 1 (Week 1): STG=LTG secondary to short ELOS  Skilled Therapeutic Interventions/Progress Updates:    Pt all BADLs (toilet transfer, toileting, shower transfer, bathing, and dressing) this morning at mod I level.  Pt gathered all clothing and supplies prior to engaging in BADLs. Pt uses AE appropriately to assist with LB bathing and dressing tasks.  Pt completed grooming tasks while standing at sink.  Continued discharge planning.  Pt owns all necessary equipment (BSC and tub transfer bench).  Therapy Documentation Precautions:  Precautions Precautions: Anterior Hip Precaution Comments:   Restrictions Weight Bearing Restrictions: Yes LLE Weight Bearing: Weight bearing as tolerated   Pain: Pain Assessment Pain Assessment: No/denies pain Pain Score: 5  Pain Type: Acute pain;Surgical pain Pain Location: Hip Pain Orientation: Left Pain Descriptors / Indicators: Aching Pain Frequency: Intermittent Pain Onset: On-going Pain Intervention(s): Medication (See eMAR) Multiple Pain Sites: No  See Function Navigator for Current Functional Status.   Therapy/Group: Individual Therapy  Rich BraveLanier, Kristen Fromm Chappell 08/03/2015, 10:05 AM

## 2015-08-03 NOTE — Progress Notes (Signed)
Physical Therapy Discharge Summary  Patient Details  Name: [name] MRN: [id number] Date of Birth: [birth year]  Today's Date: 08/03/2015 PT Individual Time: 1000-1100 PT Individual Time Calculation (min): 60 min    Patient has met 7 of 7 long term goals due to improved activity tolerance, improved balance, improved postural control, increased strength, increased range of motion, decreased pain and ability to compensate for deficits.  Patient to discharge at an ambulatory level Modified Independent. Patient's care partner is independent to provide the necessary physical assistance at discharge.  Reasons goals not met: NA  Recommendation:  Patient will benefit from ongoing skilled PT services in home health setting to continue to advance safe functional mobility, address ongoing impairments in strength, ROM, activity tolerance, and minimize fall risk.  Equipment: No equipment provided-patient already owns heavy duty RW  Reasons for discharge: treatment goals met and discharge from hospital  Patient/family agrees with progress made and goals achieved: Yes  Skilled Therapeutic Intervention Patient mod I for gait, bed mobility, transfers, and simulated car transfer using RW and supervision for stair negotiation using 2 rails and curb negotiation using RW. Patient performed supine HEP with handout provided: ankle pumps x 20, heel slides x 20 each LE, isometric hip adduction squeezes x 20, AROM RLE/AAROM LLE supine hip abduction/adduction to neutral, assisted L SLR, SAQ over bolster x 20 each LE. Patient with no further questions/concerns regarding discharge home, left mod I in room with needs in reach.   PT Discharge Precautions/Restrictions Precautions Precautions: Anterior Hip Restrictions Weight Bearing Restrictions: Yes LLE Weight Bearing: Weight bearing as tolerated Pain Pain Assessment Pain Assessment: 0-10 Pain Score: 6  Pain Type: Acute pain;Surgical pain Pain Location:  Hip Pain Orientation: Left Pain Descriptors / Indicators: Aching Pain Onset: On-going Pain Intervention(s): Repositioned;Ambulation/increased activity;Rest Vision/Perception   No change from baseline  Cognition Overall Cognitive Status: Within Functional Limits for tasks assessed Arousal/Alertness: Awake/alert Orientation Level: Oriented X4 Attention: Selective Selective Attention: Appears intact Memory: Appears intact Awareness: Appears intact Problem Solving: Appears intact Safety/Judgment: Appears intact Sensation Sensation Light Touch: Appears Intact Hot/Cold: Appears Intact Proprioception: Appears Intact Coordination Gross Motor Movements are Fluid and Coordinated: Yes Fine Motor Movements are Fluid and Coordinated: Yes Motor  Motor Motor: Within Functional Limits  Mobility Bed Mobility Bed Mobility: Rolling Right;Sit to Supine;Supine to Sit Rolling Right: 6: Modified independent (Device/Increase time) Supine to Sit: 6: Modified independent (Device/Increase time) Sit to Supine: 6: Modified independent (Device/Increase time) Transfers Transfers: Yes Sit to Stand: 6: Modified independent (Device/Increase time);With upper extremity assist Stand to Sit: 6: Modified independent (Device/Increase time);With upper extremity assist Stand Pivot Transfers: 6: Modified independent (Device/Increase time);With armrests Locomotion  Ambulation Ambulation: Yes Ambulation/Gait Assistance: 6: Modified independent (Device/Increase time) Ambulation Distance (Feet): 170 Feet Assistive device: Rolling walker Gait Gait: Yes Gait Pattern: Impaired Gait Pattern: Trunk flexed;Decreased stride length;Decreased trunk rotation Stairs / Additional Locomotion Stairs: Yes Stairs Assistance: 5: Supervision Stair Management Technique: Two rails Number of Stairs: 12 Height of Stairs: 3 (8 3", 4 6") Ramp: 6: Modified independent (Device) Curb: 5: Building services engineer  Mobility: No  Trunk/Postural Assessment  Cervical Assessment Cervical Assessment: Within Functional Limits Thoracic Assessment Thoracic Assessment: Within Functional Limits Lumbar Assessment Lumbar Assessment: Exceptions to [facility] Postural Control Postural Control: Within Functional Limits  Balance Balance Balance Assessed: Yes Static Standing Balance Static Standing - Balance Support: During functional activity Static Standing - Level of Assistance: 6: Modified independent (Device/Increase time) Dynamic Standing Balance Dynamic Standing - Balance Support: During  functional activity Dynamic Standing - Level of Assistance: 6: Modified independent (Device/Increase time) Extremity Assessment  RUE Assessment RUE Assessment: Within Functional Limits LUE Assessment LUE Assessment: Within Functional Limits RLE Assessment RLE Assessment: Exceptions to [facility] RLE Strength RLE Overall Strength: Deficits;Due to pain LLE Assessment LLE Assessment: Exceptions to WFL LLE PROM (degrees) Overall PROM Left Lower Extremity: Deficits;Due to pain;Due to precautions LLE Strength LLE Overall Strength: Deficits;Due to pain;Due to precautions   See Function Navigator for Current Functional Status.  Carney Living A 08/03/2015, 7:57 AM

## 2015-08-03 NOTE — Progress Notes (Signed)
Occupational Therapy Discharge Summary  Patient Details  Name: [name] MRN: [id number] Date of Birth: 1976/03/06  Patient has met 10 of 10 long term goals due to improved activity tolerance, improved balance and ability to compensate for deficits.  Pt made excellent progress with BADLs and IADLs during this admission.  Pt is mod I for all BADLs and IADLs (simple meal prep and laundry).  Pt uses AE appropriately and adheres to anterior hip precautions.  Pt's wife has not been present for therapy.Patient to discharge at overall Modified Independent level and Supervision tub/shower transfers.  Patient's care partner is independent to provide the necessary physical assistance at discharge.      Recommendation:  Patient will not require follow up OT at this time.  Equipment: No equipment providedPt owns necessary equipment purchased during earlier hospitalizaiton  Reasons for discharge: treatment goals met and discharge from hospital  Patient/family agrees with progress made and goals achieved: Yes  OT Discharge   Vision/Perception  Vision- History Baseline Vision/History: Wears glasses Wears Glasses: At all times Vision- Assessment Vision Assessment?: No apparent visual deficits  Cognition Overall Cognitive Status: Within Functional Limits for tasks assessed Arousal/Alertness: Awake/alert Orientation Level: Oriented X4 Attention: Selective Selective Attention: Appears intact Memory: Appears intact Awareness: Appears intact Problem Solving: Appears intact Safety/Judgment: Appears intact Sensation Sensation Light Touch: Appears Intact Hot/Cold: Appears Intact Proprioception: Appears Intact Coordination Gross Motor Movements are Fluid and Coordinated: Yes Fine Motor Movements are Fluid and Coordinated: Yes Motor  Motor Motor: Within Functional Limits Trunk/Postural Assessment  Cervical Assessment Cervical Assessment: Within Functional Limits Thoracic  Assessment Thoracic Assessment: Within Functional Limits Lumbar Assessment Lumbar Assessment: Exceptions to [facility] Postural Control Postural Control: Within Functional Limits  Balance Static Sitting Balance Static Sitting - Balance Support: Feet supported Static Sitting - Level of Assistance: 7: Independent Extremity/Trunk Assessment RUE Assessment RUE Assessment: Within Functional Limits LUE Assessment LUE Assessment: Within Functional Limits   See Function Navigator for Current Functional Status.  Leotis Shames [facility] 08/03/2015, 3:18 PM

## 2015-08-03 NOTE — Discharge Instructions (Signed)
Inpatient Rehab Discharge Instructions  Nicholas CongerDarius D [name] Discharge date and time: No discharge date for patient encounter.   Activities/Precautions/ Functional Status: Activity: weight bearing as tolerated anterior hip preautions Diet: diabetic diet Wound Care: keep wound clean and dry Functional status:  ___ No restrictions     ___ Walk up steps independently ___ 24/7 supervision/assistance   ___ Walk up steps with assistance ___ Intermittent supervision/assistance  ___ Bathe/dress independently ___ Walk with walker     ___ Bathe/dress with assistance ___ Walk Independently    ___ Shower independently _x__ Walk with assistance    ___ Shower with assistance ___ No alcohol     ___ Return to work/school ________   COMMUNITY REFERRALS UPON DISCHARGE:    Home Health:   PT                     Agency[facility]  [facility] Phone: [phone]         Special Instructions:    My questions have been answered and I understand these instructions. I will adhere to these goals and the provided educational materials after my discharge from the hospital.  Patient/Caregiver Signature _______________________________ Date __________  Clinician Signature _______________________________________ Date __________  Please bring this form and your medication list with you to all your follow-up doctor's appointments.

## 2015-08-03 NOTE — Progress Notes (Signed)
[facility] PHYSICAL MEDICINE & REHABILITATION     PROGRESS NOTE    Subjective/Complaints: Good weekend. Mod I in room. No complaints.   ROS: Pt denies fever, rash , headache, blurred or double vision,   vomiting, abdominal pain, diarrhea, chest pain, shortness of breath, palpitations, dysuria, dizziness, neck or back pain, bleeding, anxiety, or depression  Objective: Vital Signs: Blood pressure 115/62, pulse 96, temperature 98.1 F (36.7 C), temperature source Oral, resp. rate 18, height  (1.88 m), weight 146.875 kg (323 lb 12.8 oz), SpO2 99 %. No results found. No results for input(s): WBC, HGB, HCT, PLT in the last 72 hours. No results for input(s): NA, K, CL, GLUCOSE, BUN, CREATININE, CALCIUM in the last 72 hours.  Invalid input(s): CO CBG (last 3)   Recent Labs  08/02/15 1624 08/02/15 2116 08/03/15 0638  GLUCAP 119* 183* 132*    Wt Readings from Last 3 Encounters:  07/29/15 146.875 kg (323 lb 12.8 oz)  07/21/15 155.584 kg (343 lb)  07/14/15 137.44 kg (303 lb)    Physical Exam:  Constitutional: He is oriented to person, place, and time.  40 year old right-handed morbidly obese male  HENT: oral mucosa pink and moist Head: Normocephalic.  Eyes: EOM are normal.  Neck: Normal range of motion. Neck supple. No thyromegaly present.  Cardiovascular: Normal rate and regular rhythm.  Respiratory: Effort normal and breath sounds normal. No respiratory distress.  GI: Soft. Bowel sounds are normal. He exhibits no distension. No tenderness Musculoskeletal:  Left hip limited ROM due to pain. Continued tenderness with AROM/PROM Right hip. bilateral MTP's less tender with palpation. No redness or warmth seen in the feet---may have some swelling at MTP's. Knees are unremarkable Neurological: He is alert and oriented to person, place, and time.  5/5 UE. LLE 1/5 HF, ke and 3/5 adf/pf. RLE: 2/5 hf, k3 and 3/5 adf/pf. No sensory deficits.  Skin:  Hip incision is  dressed, clean without  drainage or warmth.  Psychiatric: He has a normal mood and affect. His behavior is normal   Assessment/Plan: 1. Functional and mobility deficits secondary to OA left hip, left THA which require 3+ hours per day of interdisciplinary therapy in a comprehensive inpatient rehab setting. Physiatrist is providing close team supervision and 24 hour management of active medical problems listed below. Physiatrist and rehab team continue to assess barriers to discharge/monitor patient progress toward functional and medical goals.  Function:  Bathing Bathing position   Position: Shower  Bathing parts Body parts bathed by patient: Right arm, Left arm, Chest, Abdomen, Right upper leg, Left upper leg, Front perineal area, Buttocks, Right lower leg, Left lower leg Body parts bathed by helper: Front perineal area, Buttocks  Bathing assist Assist Level: Assistive device      Upper Body Dressing/Undressing Upper body dressing   What is the patient wearing?: Pull over shirt/dress     Pull over shirt/dress - Perfomed by patient: Thread/unthread right sleeve, Thread/unthread left sleeve, Put head through opening, Pull shirt over trunk Pull over shirt/dress - Perfomed by helper: Pull shirt over trunk        Upper body assist Assist Level: No help, No cues   Set up : To obtain clothing/put away  Lower Body Dressing/Undressing Lower body dressing Lower body dressing/undressing activity did not occur: N/A (pt completed task with nursing staff, secondary to time constraints) What is the patient wearing?: Underwear, Pants, Socks, Shoes Underwear - Performed by patient: Thread/unthread right underwear leg, Thread/unthread left underwear leg, Pull underwear  up/down   Pants- Performed by patient: Thread/unthread right pants leg, Thread/unthread left pants leg, Pull pants up/down, Fasten/unfasten pants Pants- Performed by helper: Pull pants up/down, Thread/unthread left pants leg,  Thread/unthread right pants leg   Non-skid slipper socks- Performed by helper: Don/doff right sock, Don/doff left sock Socks - Performed by patient: Don/doff right sock, Don/doff left sock   Shoes - Performed by patient: Don/doff left shoe, Don/doff right shoe Shoes - Performed by helper: Don/doff right shoe          Lower body assist Assist for lower body dressing: No Help, No cues Assistive Device Comment: long handled shoe horn, reacher, sock aid Set up : To obtain clothing/put away  Toileting Toileting Toileting activity did not occur:  (modified independent in room) Toileting steps completed by patient: Adjust clothing prior to toileting, Performs perineal hygiene, Adjust clothing after toileting Toileting steps completed by helper: Performs perineal hygiene Toileting Assistive Devices: Grab bar or rail  Toileting assist Assist level: No help/no cues   Transfers Chair/bed transfer   Chair/bed transfer method: Ambulatory Chair/bed transfer assist level: Supervision or verbal cues Chair/bed transfer assistive device: Armrests, Patent attorneyWalker     Locomotion Ambulation     Max distance: 170 Assist level: No help, No cues, assistive device, takes more than a reasonable amount of time   Wheelchair   Type: Manual Max wheelchair distance: 100 Assist Level: Supervision or verbal cues  Cognition Comprehension Comprehension assist level: Follows complex conversation/direction with no assist  Expression Expression assist level: Expresses complex ideas: With extra time/assistive device  Social Interaction Social Interaction assist level: Interacts appropriately with others - No medications needed.  Problem Solving Problem solving assist level: Solves complex problems: With extra time  Memory Memory assist level: Complete Independence: No helper   Medical Problem List and Plan: 1. 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 -dc home today with follow up arranged  -no follow up with me. Needs ortho and pcp follow up - Weightbearing as tolerated LLE with anterior hip precautions 2. DVT Prophylaxis/Anticoagulation: Aspirin 325 mg twice a day.vasc study neg  3. Pain Management: Oxycodone and Robaxin as needed. 4. Acute blood loss anemia. Follow-up hgb 10.2   5. Neuropsych: This patient is capable of making decisions on his own behalf. 6. Skin/Wound Care: Routine skin checks 7. Fluids/Electrolytes/Nutrition: Replacing potassium 8. Hypertension. Maxzide 1 tablet daily. Monitor with increased mobility 9. Diabetes mellitus of peripheral neuropathy. Hemoglobin A1c 6.6.Invokana 100 mg daily. Check blood sugars before meals and at bedtime. Diabetic teaching  -SSI at present  -good control at present 10. Morbid obesity. Dietary follow-up 11. Low grade temp/leukocytosis. resolved   12. Tachycardia  -likely related to ABLA and volume depletion---slowly improving  -continue to monitor.   -  Pushing fluids 13. Constipation/nausea  -moving bowels daily now  -sx improved LOS (Days) 10 A FACE TO FACE EVALUATION WAS PERFORMED  Orine Goga T 08/03/2015 9:00 AM

## 2015-08-03 NOTE — Progress Notes (Signed)
Pt discharged home with family. Discharge instructions provided by Harvel Ricksan Anguilli, PA. All questions answered, pt verbalized understanding. Pt escorted off unit in w/c with personal belonging by Kenney Housemananya, NT

## 2015-08-10 ENCOUNTER — Telehealth: Payer: Self-pay

## 2015-08-10 NOTE — Telephone Encounter (Signed)
Nicholas DuosKelly Carter- [name] stating that [name] does not qualify for home health [name] due to not being homebound. If [name] needed, will need to place order for out-patient [name]. Please advise?

## 2015-08-11 NOTE — Telephone Encounter (Signed)
He will need to discuss outpatient therapy with his orthopedic surgeon

## 2015-08-12 NOTE — Telephone Encounter (Signed)
Left message for Mr [name], as the PT service was not seeing pt.

## 2015-09-15 ENCOUNTER — Other Ambulatory Visit: Payer: Self-pay | Admitting: Physician Assistant

## 2015-09-15 ENCOUNTER — Other Ambulatory Visit (HOSPITAL_COMMUNITY): Payer: Self-pay | Admitting: *Deleted

## 2015-09-15 NOTE — Patient Instructions (Signed)
[name]  09/15/2015   Your procedure is scheduled on: 09-25-15  [facility]  [facility] to 3rd floor to  Short Stay Center at 1020 AM.  Call this number if you have problems the morning of surgery [phone]   Remember: ONLY 1 PERSON MAY GO WITH YOU TO SHORT STAY TO GET  READY MORNING OF YOUR SURGERY.  Do not eat food or drink liquids :After Midnight.     Take these medicines the morning of surgery with A SIP OF WATER: cyclobenzaprine (flexeril), oxycodone if needed DO NOT TAKE ANY DIABETIC MEDICATIONS DAY OF YOUR SURGERY                               You may not have any metal on your body including hair pins and              piercings  Do not wear jewelry, make-up, lotions, powders or perfumes, deodorant             Do not wear nail polish.  Do not shave  48 hours prior to surgery.              Men may shave face and neck.   Do not bring valuables to the hospital. [facility] IS NOT             RESPONSIBLE   FOR VALUABLES.  Contacts, dentures or bridgework may not be worn into surgery.  Leave suitcase in the car. After surgery it may be brought to your room.     Patients discharged the day of surgery will not be allowed to drive home.  Name and phone number of your driver:  Special Instructions: N/A              Please read over the following fact sheets you were given: _____________________________________________________________________             [facility] - Preparing for [facility] Before surgery, you can play an important role.  Because skin is not sterile, your skin needs to be as free of germs as possible.  You can reduce the number of germs on your skin by washing with CHG (chlorahexidine gluconate) soap before surgery.  CHG is an antiseptic cleaner which kills germs and bonds with the skin to continue killing germs even after washing. Please DO NOT use if you have an allergy to CHG or antibacterial  soaps.  If your skin becomes reddened/irritated stop using the CHG and inform your nurse when you arrive at Short Stay. Do not shave (including legs and underarms) for at least 48 hours prior to the first CHG shower.  You may shave your face/neck. Please follow these instructions carefully:  1.  Shower with CHG Soap the night before surgery and the  morning of Surgery.  2.  If you choose to wash your hair, wash your hair first as usual with your  normal  shampoo.  3.  After you shampoo, rinse your hair and body thoroughly to remove the  shampoo.                           4.  Use CHG as you would any other liquid soap.  You can apply chg directly  to the skin  and wash                       Gently with a scrungie or clean washcloth.  5.  Apply the CHG Soap to your body ONLY FROM THE NECK DOWN.   Do not use on face/ open                           Wound or open sores. Avoid contact with eyes, ears mouth and genitals (private parts).                       Wash face,  Genitals (private parts) with your normal soap.             6.  Wash thoroughly, paying special attention to the area where your surgery  will be performed.  7.  Thoroughly rinse your body with warm water from the neck down.  8.  DO NOT shower/wash with your normal soap after using and rinsing off  the CHG Soap.                9.  Pat yourself dry with a clean towel.            10.  Wear clean pajamas.            11.  Place clean sheets on your bed the night of your first shower and do not  sleep with pets. Day of Surgery : Do not apply any lotions/deodorants the morning of surgery.  Please wear clean clothes to the hospital/surgery center.  FAILURE TO FOLLOW THESE INSTRUCTIONS MAY RESULT IN THE CANCELLATION OF YOUR SURGERY PATIENT SIGNATURE_________________________________  NURSE SIGNATURE__________________________________  ________________________________________________________________________   [name]  An  incentive spirometer is a tool that can help keep your lungs clear and active. This tool measures how well you are filling your lungs with each breath. Taking long deep breaths may help reverse or decrease the chance of developing breathing (pulmonary) problems (especially infection) following:  A long period of time when you are unable to move or be active. BEFORE THE PROCEDURE   If the spirometer includes an indicator to show your best effort, your nurse or respiratory therapist will set it to a desired goal.  If possible, sit up straight or lean slightly forward. Try not to slouch.  Hold the incentive spirometer in an upright position. INSTRUCTIONS FOR USE   Sit on the edge of your bed if possible, or sit up as far as you can in bed or on a chair.  Hold the incentive spirometer in an upright position.  Breathe out normally.  Place the mouthpiece in your mouth and seal your lips tightly around it.  Breathe in slowly and as deeply as possible, raising the piston or the ball toward the top of the column.  Hold your breath for 3-5 seconds or for as long as possible. Allow the piston or ball to fall to the bottom of the column.  Remove the mouthpiece from your mouth and breathe out normally.  Rest for a few seconds and repeat Steps 1 through 7 at least 10 times every 1-2 hours when you are awake. Take your time and take a few normal breaths between deep breaths.  The spirometer may include an indicator to show your best effort. Use the indicator as a goal to work toward during each repetition.  After each set of 10  deep breaths, practice coughing to be sure your lungs are clear. If you have an incision (the cut made at the time of surgery), support your incision when coughing by placing a pillow or rolled up towels firmly against it. Once you are able to get out of bed, walk around indoors and cough well. You may stop using the incentive spirometer when instructed by your caregiver.   RISKS AND COMPLICATIONS  Take your time so you do not get dizzy or light-headed.  If you are in pain, you may need to take or ask for pain medication before doing incentive spirometry. It is harder to take a deep breath if you are having pain. AFTER USE  Rest and breathe slowly and easily.  It can be helpful to keep track of a log of your progress. Your caregiver can provide you with a simple table to help with this. If you are using the spirometer at home, follow these instructions: [place] IF:   You are having difficultly using the spirometer.  You have trouble using the spirometer as often as instructed.  Your pain medication is not giving enough relief while using the spirometer.  You develop fever of 100.5 F (38.1 C) or higher. SEEK IMMEDIATE MEDICAL CARE IF:   You cough up bloody sputum that had not been present before.  You develop fever of 102 F (38.9 C) or greater.  You develop worsening pain at or near the incision site. MAKE SURE YOU:   Understand these instructions.  Will watch your condition.  Will get help right away if you are not doing well or get worse. Document Released: 09/05/2006 Document Revised: 07/18/2011 Document Reviewed: 11/06/2006 Exit[employer] Patient Information 2014 Exit[employer], [place].   ________________________________________________________________________  WHAT IS A BLOOD TRANSFUSION? Blood Transfusion Information  A transfusion is the replacement of blood or some of its parts. Blood is made up of multiple cells which provide different functions.  Red blood cells carry oxygen and are used for blood loss replacement.  White blood cells fight against infection.  Platelets control bleeding.  Plasma helps clot blood.  Other blood products are available for specialized needs, such as hemophilia or other clotting disorders. BEFORE THE TRANSFUSION  Who gives blood for transfusions?   Healthy volunteers who are fully evaluated  to make sure their blood is safe. This is blood bank blood. Transfusion therapy is the safest it has ever been in the practice of medicine. Before blood is taken from a donor, a complete history is taken to make sure that person has no history of diseases nor engages in risky social behavior (examples are intravenous drug use or sexual activity with multiple partners). The donor's travel history is screened to minimize risk of transmitting infections, such as malaria. The donated blood is tested for signs of infectious diseases, such as HIV and hepatitis. The blood is then tested to be sure it is compatible with you in order to minimize the chance of a transfusion reaction. If you or a relative donates blood, this is often done in anticipation of surgery and is not appropriate for emergency situations. It takes many days to process the donated blood. RISKS AND COMPLICATIONS Although transfusion therapy is very safe and saves many lives, the main dangers of transfusion include:   Getting an infectious disease.  Developing a transfusion reaction. This is an allergic reaction to something in the blood you were given. Every precaution is taken to prevent this. The decision to have a blood transfusion  has been considered carefully by your caregiver before blood is given. Blood is not given unless the benefits outweigh the risks. AFTER THE TRANSFUSION  Right after receiving a blood transfusion, you will usually feel much better and more energetic. This is especially true if your red blood cells have gotten low (anemic). The transfusion raises the level of the red blood cells which carry oxygen, and this usually causes an energy increase.  The nurse administering the transfusion will monitor you carefully for complications. HOME CARE INSTRUCTIONS  No special instructions are needed after a transfusion. You may find your energy is better. Speak with your caregiver about any limitations on activity for  underlying diseases you may have. SEEK MEDICAL CARE IF:   Your condition is not improving after your transfusion.  You develop redness or irritation at the intravenous (IV) site. SEEK IMMEDIATE MEDICAL CARE IF:  Any of the following symptoms occur over the next 12 hours:  Shaking chills.  You have a temperature by mouth above 102 F (38.9 C), not controlled by medicine.  Chest, back, or muscle pain.  People around you feel you are not acting correctly or are confused.  Shortness of breath or difficulty breathing.  Dizziness and fainting.  You get a rash or develop hives.  You have a decrease in urine output.  Your urine turns a dark color or changes to pink, red, or brown. Any of the following symptoms occur over the next 10 days:  You have a temperature by mouth above 102 F (38.9 C), not controlled by medicine.  Shortness of breath.  Weakness after normal activity.  The white part of the eye turns yellow (jaundice).  You have a decrease in the amount of urine or are urinating less often.  Your urine turns a dark color or changes to pink, red, or brown. Document Released: 04/22/2000 Document Revised: 07/18/2011 Document Reviewed: 12/10/2007 [facility] Patient Information 2014 [place], [place].  _______________________________________________________________________

## 2015-09-15 NOTE — Progress Notes (Addendum)
ekg 07-14-15 epic

## 2015-09-17 ENCOUNTER — Encounter (HOSPITAL_COMMUNITY)
Admission: RE | Admit: 2015-09-17 | Discharge: 2015-09-17 | Disposition: A | Discharge status: Home / Self Care | Payer: BLUE CROSS/BLUE SHIELD | Source: Ambulatory Visit | Attending: Orthopaedic Surgery | Admitting: Orthopaedic Surgery

## 2015-09-17 ENCOUNTER — Encounter (HOSPITAL_COMMUNITY): Payer: Self-pay

## 2015-09-17 DIAGNOSIS — Z01812 Encounter for preprocedural laboratory examination: Secondary | ICD-10-CM | POA: Diagnosis present

## 2015-09-17 LAB — CBC
HEMATOCRIT: 39.2 % (ref 39.0–52.0)
HEMOGLOBIN: 12.4 g/dL — AB (ref 13.0–17.0)
MCH: 28.4 pg (ref 26.0–34.0)
MCHC: 31.6 g/dL (ref 30.0–36.0)
MCV: 89.7 fL (ref 78.0–100.0)
Platelets: 348 10*3/uL (ref 150–400)
RBC: 4.37 MIL/uL (ref 4.22–5.81)
RDW: 14.7 % (ref 11.5–15.5)
WBC: 8.6 10*3/uL (ref 4.0–10.5)

## 2015-09-17 LAB — BASIC METABOLIC PANEL
ANION GAP: 11 (ref 5–15)
BUN: 22 mg/dL — ABNORMAL HIGH (ref 6–20)
CALCIUM: 9.5 mg/dL (ref 8.9–10.3)
CHLORIDE: 105 mmol/L (ref 101–111)
CO2: 27 mmol/L (ref 22–32)
Creatinine, Ser: 1.35 mg/dL — ABNORMAL HIGH (ref 0.61–1.24)
GFR calc non Af Amer: >60 mL/min (ref >60)
GLUCOSE: 119 mg/dL — AB (ref 65–99)
POTASSIUM: 4.4 mmol/L (ref 3.5–5.1)
Sodium: 143 mmol/L (ref 135–145)

## 2015-09-17 LAB — SURGICAL PCR SCREEN
MRSA, PCR: NEGATIVE
Staphylococcus aureus: POSITIVE — AB

## 2015-09-17 LAB — ABO/RH: ABO/RH(D): O POS

## 2015-09-18 LAB — HEMOGLOBIN A1C
Hgb A1c MFr Bld: 7.2 % — ABNORMAL HIGH (ref 4.8–5.6)
Mean Plasma Glucose: 160 mg/dL

## 2015-09-24 MED ORDER — DEXTROSE 5 % IV SOLN
3.0000 g | INTRAVENOUS | Status: AC
  Administered 2015-09-25: 3 g via INTRAVENOUS
  Filled 2015-09-24: qty 3

## 2015-09-25 ENCOUNTER — Inpatient Hospital Stay (HOSPITAL_COMMUNITY): Payer: BLUE CROSS/BLUE SHIELD

## 2015-09-25 ENCOUNTER — Encounter (HOSPITAL_COMMUNITY): Payer: Self-pay

## 2015-09-25 ENCOUNTER — Inpatient Hospital Stay (HOSPITAL_COMMUNITY)
Admission: RE | Admit: 2015-09-25 | Discharge: 2015-09-28 | DRG: 470 | Disposition: A | Discharge status: Home Health | Payer: BLUE CROSS/BLUE SHIELD | Source: Ambulatory Visit | Attending: Orthopaedic Surgery | Admitting: Orthopaedic Surgery

## 2015-09-25 ENCOUNTER — Encounter (HOSPITAL_COMMUNITY)
Admission: RE | Disposition: A | Discharge status: Home Health | Payer: Self-pay | Source: Ambulatory Visit | Attending: Orthopaedic Surgery

## 2015-09-25 ENCOUNTER — Inpatient Hospital Stay (HOSPITAL_COMMUNITY): Payer: BLUE CROSS/BLUE SHIELD | Admitting: Anesthesiology

## 2015-09-25 DIAGNOSIS — J45909 Unspecified asthma, uncomplicated: Secondary | ICD-10-CM | POA: Diagnosis present

## 2015-09-25 DIAGNOSIS — M25551 Pain in right hip: Secondary | ICD-10-CM | POA: Diagnosis present

## 2015-09-25 DIAGNOSIS — Z96642 Presence of left artificial hip joint: Secondary | ICD-10-CM | POA: Diagnosis present

## 2015-09-25 DIAGNOSIS — Z87891 Personal history of nicotine dependence: Secondary | ICD-10-CM

## 2015-09-25 DIAGNOSIS — M1611 Unilateral primary osteoarthritis, right hip: Principal | ICD-10-CM | POA: Diagnosis present

## 2015-09-25 DIAGNOSIS — Z981 Arthrodesis status: Secondary | ICD-10-CM | POA: Diagnosis not present

## 2015-09-25 DIAGNOSIS — I10 Essential (primary) hypertension: Secondary | ICD-10-CM | POA: Diagnosis present

## 2015-09-25 DIAGNOSIS — Z419 Encounter for procedure for purposes other than remedying health state, unspecified: Secondary | ICD-10-CM

## 2015-09-25 DIAGNOSIS — Z96641 Presence of right artificial hip joint: Secondary | ICD-10-CM

## 2015-09-25 DIAGNOSIS — E119 Type 2 diabetes mellitus without complications: Secondary | ICD-10-CM | POA: Diagnosis present

## 2015-09-25 DIAGNOSIS — Z6841 Body Mass Index (BMI) 40.0 and over, adult: Secondary | ICD-10-CM

## 2015-09-25 HISTORY — PX: TOTAL HIP ARTHROPLASTY: SHX124

## 2015-09-25 LAB — GLUCOSE, CAPILLARY
GLUCOSE-CAPILLARY: 147 mg/dL — AB (ref 65–99)
GLUCOSE-CAPILLARY: 158 mg/dL — AB (ref 65–99)
GLUCOSE-CAPILLARY: 154 mg/dL — AB (ref 65–99)
Glucose-Capillary: 134 mg/dL — ABNORMAL HIGH (ref 65–99)

## 2015-09-25 LAB — TYPE AND SCREEN
ABO/RH(D): O POS
Antibody Screen: NEGATIVE

## 2015-09-25 SURGERY — ARTHROPLASTY, HIP, TOTAL, ANTERIOR APPROACH
Anesthesia: General | Site: Hip | Laterality: Right

## 2015-09-25 MED ORDER — ROCURONIUM BROMIDE 50 MG/5ML IV SOLN
INTRAVENOUS | Status: AC
  Filled 2015-09-25: qty 1

## 2015-09-25 MED ORDER — HYDROMORPHONE HCL 1 MG/ML IJ SOLN
INTRAMUSCULAR | Status: DC | PRN
  Administered 2015-09-25 (×2): 1 mg via INTRAVENOUS

## 2015-09-25 MED ORDER — LIDOCAINE HCL (CARDIAC) 20 MG/ML IV SOLN
INTRAVENOUS | Status: DC | PRN
  Administered 2015-09-25: 100 mg via INTRAVENOUS

## 2015-09-25 MED ORDER — TRIAMTERENE-HCTZ 75-50 MG PO TABS
1.0000 | ORAL_TABLET | Freq: Every day | ORAL | Status: DC
  Administered 2015-09-25 – 2015-09-28 (×4): 1 via ORAL
  Filled 2015-09-25 (×4): qty 1

## 2015-09-25 MED ORDER — MENTHOL 3 MG MT LOZG: 1.0000 | LOZENGE | OROMUCOSAL | Status: DC | PRN

## 2015-09-25 MED ORDER — SODIUM CHLORIDE 0.9 % IV SOLN
INTRAVENOUS | Status: DC
  Administered 2015-09-25: 19:00:00 via INTRAVENOUS

## 2015-09-25 MED ORDER — SODIUM CHLORIDE 0.9 % IR SOLN
Status: DC | PRN
  Administered 2015-09-25: 1000 mL

## 2015-09-25 MED ORDER — ASPIRIN EC 325 MG PO TBEC
325.0000 mg | DELAYED_RELEASE_TABLET | Freq: Two times a day (BID) | ORAL | Status: DC
  Administered 2015-09-26 – 2015-09-28 (×5): 325 mg via ORAL
  Filled 2015-09-25 (×5): qty 1

## 2015-09-25 MED ORDER — STERILE WATER FOR IRRIGATION IR SOLN
Status: DC | PRN
  Administered 2015-09-25: 3000 mL

## 2015-09-25 MED ORDER — FENTANYL CITRATE (PF) 100 MCG/2ML IJ SOLN
INTRAMUSCULAR | Status: DC | PRN
  Administered 2015-09-25 (×2): 100 ug via INTRAVENOUS
  Administered 2015-09-25: 50 ug via INTRAVENOUS
  Administered 2015-09-25: 100 ug via INTRAVENOUS

## 2015-09-25 MED ORDER — ESMOLOL HCL 100 MG/10ML IV SOLN
INTRAVENOUS | Status: AC
  Filled 2015-09-25: qty 10

## 2015-09-25 MED ORDER — METOCLOPRAMIDE HCL 5 MG/ML IJ SOLN: 5.0000 mg | Freq: Three times a day (TID) | INTRAMUSCULAR | Status: DC | PRN

## 2015-09-25 MED ORDER — CEFAZOLIN SODIUM-DEXTROSE 2-4 GM/100ML-% IV SOLN
2.0000 g | Freq: Four times a day (QID) | INTRAVENOUS | Status: AC
  Administered 2015-09-25 (×2): 2 g via INTRAVENOUS
  Filled 2015-09-25 (×2): qty 100

## 2015-09-25 MED ORDER — HYDROMORPHONE HCL 1 MG/ML IJ SOLN
0.2500 mg | INTRAMUSCULAR | Status: DC | PRN
  Administered 2015-09-25 (×4): 0.5 mg via INTRAVENOUS

## 2015-09-25 MED ORDER — LABETALOL HCL 5 MG/ML IV SOLN
INTRAVENOUS | Status: AC
  Filled 2015-09-25: qty 4

## 2015-09-25 MED ORDER — INSULIN ASPART 100 UNIT/ML ~~LOC~~ SOLN: 0.0000 [IU] | Freq: Every day | SUBCUTANEOUS | Status: DC

## 2015-09-25 MED ORDER — HYDROMORPHONE HCL 1 MG/ML IJ SOLN: 1.0000 mg | INTRAMUSCULAR | Status: DC | PRN

## 2015-09-25 MED ORDER — SUGAMMADEX SODIUM 500 MG/5ML IV SOLN
INTRAVENOUS | Status: AC
  Filled 2015-09-25: qty 5

## 2015-09-25 MED ORDER — ROCURONIUM BROMIDE 100 MG/10ML IV SOLN
INTRAVENOUS | Status: DC | PRN
  Administered 2015-09-25: 50 mg via INTRAVENOUS
  Administered 2015-09-25: 20 mg via INTRAVENOUS

## 2015-09-25 MED ORDER — MIDAZOLAM HCL 2 MG/2ML IJ SOLN
INTRAMUSCULAR | Status: AC
  Filled 2015-09-25: qty 2

## 2015-09-25 MED ORDER — PHENOL 1.4 % MT LIQD
1.0000 | OROMUCOSAL | Status: DC | PRN
Start: 2015-09-25 — End: 2015-09-28

## 2015-09-25 MED ORDER — FENTANYL CITRATE (PF) 250 MCG/5ML IJ SOLN
INTRAMUSCULAR | Status: AC
  Filled 2015-09-25: qty 5

## 2015-09-25 MED ORDER — KETOROLAC TROMETHAMINE 15 MG/ML IJ SOLN
7.5000 mg | Freq: Four times a day (QID) | INTRAMUSCULAR | Status: AC
  Administered 2015-09-25 – 2015-09-26 (×4): 7.5 mg via INTRAVENOUS
  Filled 2015-09-25 (×4): qty 1

## 2015-09-25 MED ORDER — ACETAMINOPHEN 325 MG PO TABS: 650.0000 mg | ORAL_TABLET | Freq: Four times a day (QID) | ORAL | Status: DC | PRN

## 2015-09-25 MED ORDER — SUGAMMADEX SODIUM 200 MG/2ML IV SOLN
INTRAVENOUS | Status: DC | PRN
  Administered 2015-09-25: 500 mg via INTRAVENOUS

## 2015-09-25 MED ORDER — FENTANYL CITRATE (PF) 100 MCG/2ML IJ SOLN
INTRAMUSCULAR | Status: AC
  Filled 2015-09-25: qty 2

## 2015-09-25 MED ORDER — ZOLPIDEM TARTRATE 5 MG PO TABS: 5.0000 mg | ORAL_TABLET | Freq: Every evening | ORAL | Status: DC | PRN

## 2015-09-25 MED ORDER — CHLORHEXIDINE GLUCONATE 4 % EX LIQD: 60.0000 mL | Freq: Once | CUTANEOUS | Status: DC

## 2015-09-25 MED ORDER — METFORMIN HCL 500 MG PO TABS
1000.0000 mg | ORAL_TABLET | Freq: Two times a day (BID) | ORAL | Status: DC
  Administered 2015-09-25 – 2015-09-28 (×6): 1000 mg via ORAL
  Filled 2015-09-25 (×6): qty 2

## 2015-09-25 MED ORDER — PROPOFOL 10 MG/ML IV BOLUS
INTRAVENOUS | Status: DC | PRN
  Administered 2015-09-25: 400 mg via INTRAVENOUS

## 2015-09-25 MED ORDER — ONDANSETRON HCL 4 MG/2ML IJ SOLN: 4.0000 mg | Freq: Four times a day (QID) | INTRAMUSCULAR | Status: DC | PRN

## 2015-09-25 MED ORDER — DIPHENHYDRAMINE HCL 12.5 MG/5ML PO ELIX: 12.5000 mg | ORAL_SOLUTION | ORAL | Status: DC | PRN

## 2015-09-25 MED ORDER — OXYCODONE HCL 5 MG PO TABS
5.0000 mg | ORAL_TABLET | ORAL | Status: DC | PRN
  Administered 2015-09-25: 5 mg via ORAL
  Administered 2015-09-25: 15 mg via ORAL
  Administered 2015-09-25 – 2015-09-27 (×4): 10 mg via ORAL
  Administered 2015-09-28: 15 mg via ORAL
  Filled 2015-09-25: qty 2
  Filled 2015-09-25: qty 3
  Filled 2015-09-25 (×2): qty 2
  Filled 2015-09-25: qty 3
  Filled 2015-09-25: qty 1
  Filled 2015-09-25: qty 2

## 2015-09-25 MED ORDER — HYDROMORPHONE HCL 2 MG/ML IJ SOLN
INTRAMUSCULAR | Status: AC
  Filled 2015-09-25: qty 1

## 2015-09-25 MED ORDER — POLYETHYLENE GLYCOL 3350 17 G PO PACK
17.0000 g | PACK | Freq: Every day | ORAL | Status: DC | PRN
  Administered 2015-09-26: 17 g via ORAL
  Filled 2015-09-25: qty 1

## 2015-09-25 MED ORDER — HYDROMORPHONE HCL 1 MG/ML IJ SOLN
INTRAMUSCULAR | Status: AC
  Filled 2015-09-25: qty 1

## 2015-09-25 MED ORDER — METOCLOPRAMIDE HCL 5 MG PO TABS: 5.0000 mg | ORAL_TABLET | Freq: Three times a day (TID) | ORAL | Status: DC | PRN

## 2015-09-25 MED ORDER — ALUM & MAG HYDROXIDE-SIMETH 200-200-20 MG/5ML PO SUSP: 30.0000 mL | ORAL | Status: DC | PRN

## 2015-09-25 MED ORDER — INSULIN ASPART 100 UNIT/ML ~~LOC~~ SOLN
0.0000 [IU] | Freq: Three times a day (TID) | SUBCUTANEOUS | Status: DC
  Administered 2015-09-25: 4 [IU] via SUBCUTANEOUS
  Administered 2015-09-26 – 2015-09-27 (×4): 3 [IU] via SUBCUTANEOUS
  Administered 2015-09-27: 4 [IU] via SUBCUTANEOUS
  Administered 2015-09-28: 3 [IU] via SUBCUTANEOUS
  Filled 2015-09-25 (×2): qty 1

## 2015-09-25 MED ORDER — OXYCODONE HCL ER 20 MG PO T12A
20.0000 mg | EXTENDED_RELEASE_TABLET | Freq: Two times a day (BID) | ORAL | Status: DC
  Administered 2015-09-25 – 2015-09-28 (×6): 20 mg via ORAL
  Filled 2015-09-25 (×6): qty 1

## 2015-09-25 MED ORDER — TRANEXAMIC ACID 1000 MG/10ML IV SOLN
1000.0000 mg | INTRAVENOUS | Status: AC
  Administered 2015-09-25: 1000 mg via INTRAVENOUS
  Filled 2015-09-25: qty 10

## 2015-09-25 MED ORDER — ONDANSETRON HCL 4 MG PO TABS: 4.0000 mg | ORAL_TABLET | Freq: Four times a day (QID) | ORAL | Status: DC | PRN

## 2015-09-25 MED ORDER — LACTATED RINGERS IV SOLN
INTRAVENOUS | Status: DC
  Administered 2015-09-25 (×2): via INTRAVENOUS

## 2015-09-25 MED ORDER — DOCUSATE SODIUM 100 MG PO CAPS
100.0000 mg | ORAL_CAPSULE | Freq: Two times a day (BID) | ORAL | Status: DC
  Administered 2015-09-25 – 2015-09-28 (×7): 100 mg via ORAL
  Filled 2015-09-25 (×7): qty 1

## 2015-09-25 MED ORDER — LABETALOL HCL 5 MG/ML IV SOLN
INTRAVENOUS | Status: DC | PRN
  Administered 2015-09-25: 5 mg via INTRAVENOUS

## 2015-09-25 MED ORDER — CYCLOBENZAPRINE HCL 10 MG PO TABS
10.0000 mg | ORAL_TABLET | Freq: Three times a day (TID) | ORAL | Status: DC | PRN
  Administered 2015-09-25 – 2015-09-26 (×2): 10 mg via ORAL
  Filled 2015-09-25 (×2): qty 1

## 2015-09-25 MED ORDER — ACETAMINOPHEN 650 MG RE SUPP: 650.0000 mg | Freq: Four times a day (QID) | RECTAL | Status: DC | PRN

## 2015-09-25 MED ORDER — ONDANSETRON HCL 4 MG/2ML IJ SOLN
INTRAMUSCULAR | Status: DC | PRN
  Administered 2015-09-25: 4 mg via INTRAVENOUS

## 2015-09-25 MED ORDER — SUCCINYLCHOLINE CHLORIDE 20 MG/ML IJ SOLN
INTRAMUSCULAR | Status: DC | PRN
  Administered 2015-09-25: 200 mg via INTRAVENOUS

## 2015-09-25 MED ORDER — ONDANSETRON HCL 4 MG/2ML IJ SOLN
INTRAMUSCULAR | Status: AC
  Filled 2015-09-25: qty 2

## 2015-09-25 MED ORDER — LIDOCAINE HCL (CARDIAC) 20 MG/ML IV SOLN
INTRAVENOUS | Status: AC
  Filled 2015-09-25: qty 5

## 2015-09-25 MED ORDER — ESMOLOL HCL 100 MG/10ML IV SOLN
INTRAVENOUS | Status: DC | PRN
  Administered 2015-09-25 (×2): 20 mg via INTRAVENOUS

## 2015-09-25 MED ORDER — PROPOFOL 10 MG/ML IV BOLUS
INTRAVENOUS | Status: AC
  Filled 2015-09-25: qty 40

## 2015-09-25 MED ORDER — MIDAZOLAM HCL 5 MG/5ML IJ SOLN
INTRAMUSCULAR | Status: DC | PRN
  Administered 2015-09-25: 2 mg via INTRAVENOUS

## 2015-09-25 SURGICAL SUPPLY — 40 items
APL SKNCLS STERI-STRIP NONHPOA (GAUZE/BANDAGES/DRESSINGS)
BAG SPEC THK2 15X12 ZIP CLS (MISCELLANEOUS)
BAG ZIPLOCK 12X15 (MISCELLANEOUS) IMPLANT
BENZOIN TINCTURE PRP APPL 2/3 (GAUZE/BANDAGES/DRESSINGS) IMPLANT
BLADE SAW SGTL 18X1.27X75 (BLADE) ×2 IMPLANT
BLADE SAW SGTL 18X1.27X75MM (BLADE) ×1
CAPT HIP TOTAL 2 ×2 IMPLANT
CELLS DAT CNTRL 66122 CELL SVR (MISCELLANEOUS) ×1 IMPLANT
CLOSURE WOUND 1/2 X4 (GAUZE/BANDAGES/DRESSINGS)
CLOTH BEACON ORANGE TIMEOUT ST (SAFETY) ×3 IMPLANT
DRAPE STERI IOBAN 125X83 (DRAPES) ×3 IMPLANT
DRAPE U-SHAPE 47X51 STRL (DRAPES) ×6 IMPLANT
DRSG AQUACEL AG ADV 3.5X10 (GAUZE/BANDAGES/DRESSINGS) ×3 IMPLANT
DURAPREP 26ML APPLICATOR (WOUND CARE) ×3 IMPLANT
ELECT REM PT RETURN 9FT ADLT (ELECTROSURGICAL) ×3
ELECTRODE REM PT RTRN 9FT ADLT (ELECTROSURGICAL) ×1 IMPLANT
GAUZE XEROFORM 1X8 LF (GAUZE/BANDAGES/DRESSINGS) ×2 IMPLANT
GLOVE BIO SURGEON STRL SZ7.5 (GLOVE) ×3 IMPLANT
GLOVE BIOGEL PI IND STRL 7.5 (GLOVE) IMPLANT
GLOVE BIOGEL PI IND STRL 8 (GLOVE) ×2 IMPLANT
GLOVE BIOGEL PI INDICATOR 7.5 (GLOVE) ×8
GLOVE BIOGEL PI INDICATOR 8 (GLOVE) ×8
GLOVE ECLIPSE 8.0 STRL XLNG CF (GLOVE) ×3 IMPLANT
GOWN STRL REUS W/TWL XL LVL3 (GOWN DISPOSABLE) ×10 IMPLANT
HANDPIECE INTERPULSE COAX TIP (DISPOSABLE) ×3
HOLDER FOLEY CATH W/STRAP (MISCELLANEOUS) ×3 IMPLANT
PACK ANTERIOR HIP CUSTOM (KITS) ×3 IMPLANT
RETRACTOR WND ALEXIS 18 MED (MISCELLANEOUS) ×1 IMPLANT
RTRCTR WOUND ALEXIS 18CM MED (MISCELLANEOUS) ×3
SET HNDPC FAN SPRY TIP SCT (DISPOSABLE) ×1 IMPLANT
STAPLER VISISTAT 35W (STAPLE) ×2 IMPLANT
STRIP CLOSURE SKIN 1/2X4 (GAUZE/BANDAGES/DRESSINGS) IMPLANT
SUT ETHIBOND NAB CT1 #1 30IN (SUTURE) ×3 IMPLANT
SUT MNCRL AB 4-0 PS2 18 (SUTURE) IMPLANT
SUT VIC AB 0 CT1 36 (SUTURE) ×3 IMPLANT
SUT VIC AB 1 CT1 36 (SUTURE) ×3 IMPLANT
SUT VIC AB 2-0 CT1 27 (SUTURE) ×6
SUT VIC AB 2-0 CT1 TAPERPNT 27 (SUTURE) ×2 IMPLANT
TRAY FOLEY W/METER SILVER 14FR (SET/KITS/TRAYS/PACK) ×1 IMPLANT
TRAY FOLEY W/METER SILVER 16FR (SET/KITS/TRAYS/PACK) ×1 IMPLANT

## 2015-09-25 NOTE — Brief Op Note (Signed)
09/25/2015  1:25 PM  PATIENT:  Joedy D Pola  40 y.o. male  PRE-OPERATIVE DIAGNOSIS:  severe osteoarthritis right hip  POST-OPERATIVE DIAGNOSIS:  severe osteoarthritis right hip  PROCEDURE:  Procedure(s): RIGHT TOTAL HIP ARTHROPLASTY ANTERIOR APPROACH (Right)  SURGEON:  Surgeon(s) and Role:    * Kathryne Hitchhristopher Y Blackman, MD - Primary  PHYSICIAN ASSISTANT: Rexene EdisonGil Clark, PA-C  ANESTHESIA:   general  EBL:  Total I/O In: 1000 [I.V.:1000] Out: 350 [Blood:350]  COUNTS:  YES  TOURNIQUET:  * No tourniquets in log *  DICTATION: .Other Dictation: Dictation Number [id number]  PLAN OF CARE: Admit to inpatient   PATIENT DISPOSITION:  PACU - hemodynamically stable.   Delay start of Pharmacological VTE agent (>24hrs) due to surgical blood loss or risk of bleeding: no

## 2015-09-25 NOTE — H&P (Signed)
TOTAL HIP ADMISSION H&P  Patient is admitted for right total hip arthroplasty.  Subjective:  Chief Complaint: right hip pain  HPI: [name], 40 y.o. male, has a history of pain and functional disability in the right hip(s) due to arthritis and patient has failed non-surgical conservative treatments for greater than 12 weeks to include NSAID's and/or analgesics, flexibility and strengthening excercises, supervised PT with diminished ADL's post treatment, use of assistive devices, weight reduction as appropriate and activity modification.  Onset of symptoms was gradual starting 4 years ago with gradually worsening course since that time.The patient noted no past surgery on the right hip(s).  Patient currently rates pain in the right hip at 10 out of 10 with activity. Patient has night pain, worsening of pain with activity and weight bearing, trendelenberg gait, pain that interfers with activities of daily living, pain with passive range of motion and crepitus. Patient has evidence of subchondral cysts, subchondral sclerosis, periarticular osteophytes and joint space narrowing by imaging studies. This condition presents safety issues increasing the risk of falls.  There is no current active infection.  Patient Active Problem List   Diagnosis Date Noted  . Osteoarthritis of right hip 07/24/2015  . Morbid obesity (HCC) 07/24/2015  . Osteoarthritis of left hip 07/21/2015  . Status post total replacement of left hip 07/21/2015  . Lumbar stenosis with neurogenic claudication 01/14/2014  . Spinal stenosis, lumbar region, with neurogenic claudication 07/30/2012   Past Medical History  Diagnosis Date  . Hypertension   . Diabetes mellitus without complication (HCC)   . Arthritis   . Anemia   . Asthma     as a child  . H/O transfusion of packed red blood cells     with prior lumbar fusions x2  . Anginal pain (HCC) several yrs ago  . Sleep apnea     wore CPAP prior to tonsillectomy    Past  Surgical History  Procedure Laterality Date  . Wisdom tooth extraction    . Total hip arthroplasty Left 07/21/2015    Procedure: LEFT TOTAL HIP ARTHROPLASTY ANTERIOR APPROACH;  Surgeon: Kathryne Hitch, MD;  Location: [facility] OR;  Service: Orthopedics;  Laterality: Left;  . Back surgery  march 2014 and sept 2015    lumbar fusion mrach L 3 to L 4 to L 5, sept L 2 to L 3  . Tonsillectomy  2005 march    for sleep apnea    No prescriptions prior to admission   Allergies  Allergen Reactions  . Bee Venom Anaphylaxis, Swelling and Other (See Comments)    Swelling primarily at sting site, but can be all over.  Epi pen prn    Social History  Substance Use Topics  . Smoking status: Former Smoker -- 0.50 packs/day for 20 years    Types: Cigarettes    Quit date: 07/09/2012  . Smokeless tobacco: Never Used  . Alcohol Use: No    No family history on file.   Review of Systems  Musculoskeletal: Positive for back pain and joint pain.  All other systems reviewed and are negative.   Objective:  Physical Exam  Constitutional: He is oriented to person, place, and time. He appears well-developed and well-nourished.  HENT:  Head: Normocephalic and atraumatic.  Eyes: EOM are normal. Pupils are equal, round, and reactive to light.  Neck: Normal range of motion. Neck supple.  Cardiovascular: Normal rate and regular rhythm.   Respiratory: Effort normal and breath sounds normal.  GI: Soft. Bowel  sounds are normal.  Musculoskeletal:       Right hip: He exhibits decreased range of motion, decreased strength, tenderness and bony tenderness.  Neurological: He is alert and oriented to person, place, and time.  Skin: Skin is warm and dry.  Psychiatric: He has a normal mood and affect.    Vital signs in last 24 hours:    Labs:   Estimated body mass index is 41.56 kg/(m^2) as calculated from the following:   Height as of 07/24/15: 6\' 2"  (1.88 m).   Weight as of 07/24/15: 146.875 kg (323 lb 12.8  oz).   Imaging Review Plain radiographs demonstrate severe degenerative joint disease of the right hip(s). The bone quality appears to be good for age and reported activity level.  Assessment/Plan:  End stage arthritis, right hip(s)  The patient history, physical examination, clinical judgement of the provider and imaging studies are consistent with end stage degenerative joint disease of the right hip(s) and total hip arthroplasty is deemed medically necessary. The treatment options including medical management, injection therapy, arthroscopy and arthroplasty were discussed at length. The risks and benefits of total hip arthroplasty were presented and reviewed. The risks due to aseptic loosening, infection, stiffness, dislocation/subluxation,  thromboembolic complications and other imponderables were discussed.  The patient acknowledged the explanation, agreed to proceed with the plan and consent was signed. Patient is being admitted for inpatient treatment for surgery, pain control, PT, OT, prophylactic antibiotics, VTE prophylaxis, progressive ambulation and ADL's and discharge planning.The patient is planning to be discharged home with home health services

## 2015-09-25 NOTE — Anesthesia Preprocedure Evaluation (Addendum)
Anesthesia Evaluation  Patient identified by MRN, date of birth, ID band Patient awake    Reviewed: Allergy & Precautions, NPO status   Airway Mallampati: II  TM Distance: >3 FB Neck ROM: Full    Dental   Pulmonary asthma , sleep apnea , former smoker,    breath sounds clear to auscultation       Cardiovascular hypertension, + angina  Rhythm:Regular Rate:Normal     Neuro/Psych    GI/Hepatic negative GI ROS, Neg liver ROS,   Endo/Other  diabetes  Renal/GU negative Renal ROS     Musculoskeletal  (+) Arthritis ,   Abdominal   Peds  Hematology   Anesthesia Other Findings   Reproductive/Obstetrics                            Anesthesia Physical Anesthesia Plan  ASA: III  Anesthesia Plan: General   Post-op Pain Management:    Induction: Intravenous  Airway Management Planned: Oral ETT  Additional Equipment:   Intra-op Plan:   Post-operative Plan: Extubation in OR  Informed Consent: I have reviewed the patients History and Physical, chart, labs and discussed the procedure including the risks, benefits and alternatives for the proposed anesthesia with the patient or authorized representative who has indicated his/her understanding and acceptance.   Dental advisory given  Plan Discussed with: CRNA and Anesthesiologist  Anesthesia Plan Comments:         Anesthesia Quick Evaluation

## 2015-09-25 NOTE — Transfer of Care (Signed)
Immediate Anesthesia Transfer of Care Note  Patient: [name]  Procedure(s) Performed: Procedure(s): RIGHT TOTAL HIP ARTHROPLASTY ANTERIOR APPROACH (Right)  Patient Location: PACU  Anesthesia Type:General  Level of Consciousness: sedated  Airway & Oxygen Therapy: Patient Spontanous Breathing and Patient connected to face mask oxygen  Post-op Assessment: Report given to RN and Post -op Vital signs reviewed and stable  Post vital signs: Reviewed and stable  Last Vitals:  Filed Vitals:   09/25/15 1057  BP: 138/94  Pulse: 103  Temp: 37.3 C  Resp: 18    Last Pain:  Filed Vitals:   09/25/15 1116  PainSc: 5          Complications: No apparent anesthesia complications

## 2015-09-25 NOTE — Anesthesia Procedure Notes (Signed)
Procedure Name: Intubation Date/Time: 09/25/2015 12:05 PM Performed by: Doran ClayALDAY, Nicholas Cartier R Pre-anesthesia Checklist: Patient identified, Timeout performed, Emergency Drugs available, Suction available and Patient being monitored Patient Re-evaluated:Patient Re-evaluated prior to inductionOxygen Delivery Method: Circle system utilized Preoxygenation: Pre-oxygenation with 100% oxygen Intubation Type: IV induction Laryngoscope Size: Mac, 4 and Glidescope Grade View: Grade I Tube size: 7.5 mm Number of attempts: 1 Airway Equipment and Method: Stylet and Video-laryngoscopy Placement Confirmation: ETT inserted through vocal cords under direct vision,  breath sounds checked- equal and bilateral and positive ETCO2 Secured at: 23 cm Tube secured with: Tape Dental Injury: Teeth and Oropharynx as per pre-operative assessment

## 2015-09-25 NOTE — Anesthesia Postprocedure Evaluation (Signed)
Anesthesia Post Note  Patient: [name]  Procedure(s) Performed: Procedure(s) (LRB): RIGHT TOTAL HIP ARTHROPLASTY ANTERIOR APPROACH (Right)  Patient location during evaluation: PACU Anesthesia Type: General Level of consciousness: awake Pain management: pain level controlled Vital Signs Assessment: post-procedure vital signs reviewed and stable Respiratory status: spontaneous breathing Cardiovascular status: stable Anesthetic complications: no    Last Vitals:  Filed Vitals:   09/25/15 1436 09/25/15 1448  BP:  143/94  Pulse: 109 109  Temp: 36.6 C   Resp: 14 16    Last Pain:  Filed Vitals:   09/25/15 1449  PainSc: 7                  EDWARDS,Charm Stenner

## 2015-09-26 LAB — CBC
HEMATOCRIT: 34.2 % — AB (ref 39.0–52.0)
HEMOGLOBIN: 11 g/dL — AB (ref 13.0–17.0)
MCH: 28.9 pg (ref 26.0–34.0)
MCHC: 32.2 g/dL (ref 30.0–36.0)
MCV: 89.8 fL (ref 78.0–100.0)
Platelets: 313 10*3/uL (ref 150–400)
RBC: 3.81 MIL/uL — ABNORMAL LOW (ref 4.22–5.81)
RDW: 15.1 % (ref 11.5–15.5)
WBC: 9.7 10*3/uL (ref 4.0–10.5)

## 2015-09-26 LAB — GLUCOSE, CAPILLARY
GLUCOSE-CAPILLARY: 145 mg/dL — AB (ref 65–99)
GLUCOSE-CAPILLARY: 125 mg/dL — AB (ref 65–99)
GLUCOSE-CAPILLARY: 130 mg/dL — AB (ref 65–99)
Glucose-Capillary: 126 mg/dL — ABNORMAL HIGH (ref 65–99)

## 2015-09-26 LAB — BASIC METABOLIC PANEL
ANION GAP: 9 (ref 5–15)
BUN: 18 mg/dL (ref 6–20)
CALCIUM: 7.9 mg/dL — AB (ref 8.9–10.3)
CO2: 27 mmol/L (ref 22–32)
CREATININE: 1.14 mg/dL (ref 0.61–1.24)
Chloride: 101 mmol/L (ref 101–111)
GFR calc non Af Amer: >60 mL/min (ref >60)
Glucose, Bld: 138 mg/dL — ABNORMAL HIGH (ref 65–99)
Potassium: 3.5 mmol/L (ref 3.5–5.1)
SODIUM: 137 mmol/L (ref 135–145)

## 2015-09-26 NOTE — Progress Notes (Signed)
Occupational Therapy Evaluation Patient Details Name: [name] MRN: [id number] DOB: 11/07/1975 Today's Date: 09/26/2015    History of Present Illness 40 yo male s/p R THA (anterior approach). PMH includes: L THR (3/17),HTN, DM, OSA, anemia, asthma (as a child). Former smoker. BMI 39. S/p lumbar fusion 01/14/14 and 07/30/12.    Clinical Impression   Patient recalls all OT techniques from previous hip replacement in March 2017. No further OT needs at this time. Will sign off.    Follow Up Recommendations  No OT follow up;Supervision - Intermittent    Equipment Recommendations  None recommended by OT    Recommendations for Other Services       Precautions / Restrictions Precautions Precautions: Fall Restrictions Weight Bearing Restrictions: No Other Position/Activity Restrictions: WBAT      Mobility Bed Mobility Overal bed mobility: Needs Assistance Bed Mobility: Supine to Sit     Supine to sit: Min assist;HOB elevated     General bed mobility comments: Min assist with R LE.  Pt utilizing bed rail and HOB at 30 degrees  Transfers Overall transfer level: Needs assistance Equipment used: Rolling walker (2 wheeled) Transfers: Sit to/from Stand Sit to Stand: Min guard;From elevated surface         General transfer comment: min cues for use of UEs to self assist    Balance                                            ADL Overall ADL's : At baseline                                       General ADL Comments: Patient reports he recalls all ADL techniques from prior hip replacement in March. He has all necessary DME/AE for home. No further OT needs identified.     Vision     Perception     Praxis      Pertinent Vitals/Pain Pain Assessment: 0-10 Pain Score: 6  Pain Location: R hip Pain Descriptors / Indicators: Aching;Sore Pain Intervention(s): Limited activity within patient's tolerance;Monitored during  session;Premedicated before session     Hand Dominance Right   Extremity/Trunk Assessment Upper Extremity Assessment Upper Extremity Assessment: Overall WFL for tasks assessed   Lower Extremity Assessment Lower Extremity Assessment: Defer to PT evaluation RLE Deficits / Details: Strength at hip 2+/5 with AAROM at hip to 75 flex and 15 abd   Cervical / Trunk Assessment Cervical / Trunk Assessment: Normal   Communication Communication Communication: No difficulties   Cognition Arousal/Alertness: Awake/alert Behavior During Therapy: WFL for tasks assessed/performed Overall Cognitive Status: Within Functional Limits for tasks assessed                     General Comments       Exercises      Shoulder Instructions      Home Living Family/patient expects to be discharged to:: Private residence Living Arrangements: Spouse/significant other Available Help at Discharge: Family;Available PRN/intermittently Type of Home: Apartment Home Access: Level entry     Home Layout: One level     Bathroom Shower/Tub: Tub/shower unit;Curtain   Bathroom Toilet: Handicapped height Bathroom Accessibility: Yes How Accessible: Accessible via wheelchair;Accessible via walker Home Equipment: Walker - 4 wheels;Bedside commode;Cane - single point;Tub bench;Grab  bars - toilet;Grab bars - tub/shower   Additional Comments: also has reacher, sock aide, long shoe horn      Prior Functioning/Environment Level of Independence: Independent with assistive device(s);Independent             OT Diagnosis: Acute pain   OT Problem List: Decreased strength;Decreased range of motion;Pain   OT Treatment/Interventions:      OT Goals(Current goals can be found in the care plan section) Acute Rehab OT Goals Patient Stated Goal: Regain IND OT Goal Formulation: All assessment and education complete, DC therapy  OT Frequency:     Barriers to D/C:            Co-evaluation PT/OT/SLP  Co-Evaluation/Treatment: Yes Reason for Co-Treatment: For patient/therapist safety PT goals addressed during session: Mobility/safety with mobility OT goals addressed during session: ADL's and self-care      End of Session Equipment Utilized During Treatment: Rolling walker Nurse Communication: Mobility status  Activity Tolerance: Patient tolerated treatment well Patient left: in chair;with call bell/phone within reach;with chair alarm set   Time: 0981-1914 OT Time Calculation (min): 14 min Charges:  OT General Charges $OT Visit: 1 Procedure OT Evaluation $OT Eval Low Complexity: 1 Procedure G-Codes:    [name] A 2015-10-13, 1:23 PM

## 2015-09-26 NOTE — Op Note (Signed)
NAMMarland Kitchen:  Electa SniffORAIN, Roan               ACCOUNT NO.:  [id number]  MEDICAL RECORD NO.:  [id number]  LOCATION:  1618                         [facility]  PHYSICIAN:  Vanita PandaChristopher Y. Magnus IvanBlackman, M.D.DATE OF BIRTH:  Feb 24, 1976  DATE OF PROCEDURE:  09/25/2015 DATE OF DISCHARGE:                              OPERATIVE REPORT   PREOPERATIVE DIAGNOSIS:  Severe osteoarthritis and degenerative joint disease, right hip.  POSTOPERATIVE DIAGNOSIS:  Severe osteoarthritis and degenerative joint disease, right hip.  PROCEDURE:  Right total hip arthroplasty through direct anterior approach.  IMPLANTS:  DePuy Sector Gription acetabular component, size 56 with a single screw, size 36+ 4 polyethylene liner, size 13 Corail femoral component with varus offset (KLA), size 36+ 1.5 ceramic hip ball.  SURGEON:  Vanita PandaChristopher Y. Magnus IvanBlackman, M.D.  ASSISTANT:  Richardean CanalGilbert Clark, PA-C.  ANESTHESIA:  General.  ANTIBIOTICS:  3 g of IV Ancef.  BLOOD LOSS:  350 mL.  COMPLICATIONS:  None.  INDICATIONS:  Nicholas CurtDarius is a 40 year old morbidly obese individual who has severe debilitating arthritis of both his hips.  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  Less than 2 months ago, he had a successful left total hip arthroplasty through direct anterior approach and this has changed his life.  His right hip is severely arthritic as well.  Given his recent weight loss, his increasing activities of daily living, his mobility has gotten better, he wishes to proceed with a total hip arthroplasty on his right hip.  His disease is superior on that side. He understands the risk of acute blood loss anemia, nerve and vessel injury, fracture, infection, dislocation, DVT.  He understands our goals are decreased pain, improved mobility, and overall improved quality of life.  PROCEDURE DESCRIPTION:  After informed consent was obtained, appropriate right hip was marked.  He was brought to the operating room, general anesthesia was obtained  while he was on his stretcher.  Traction boots were placed on both of his feet.  Next, he was placed supine on the Hana fracture table with the perineal post in place and both legs in inline skeletal traction devices, but no traction applied.  His right operative hip was then prepped and draped with DuraPrep and sterile drapes.  A time-out was called and he was identified as correct patient and correct right hip.  I then made an incision inferior and posterior to the anterior superior iliac spine and carried this obliquely down the leg, dissected down the tensor fascia lata muscle and tensor fascia was then divided longitudinally, so we could proceed with direct anterior approach to the hip.  We identified and cauterized the lateral femoral circumflex vessels and then identified the hip capsule.  I opened up the hip capsule in an L-type format finding severe disease in his let hip. I placed Cobra retractors within the joint capsule and then made our femoral neck cut with an oscillating saw proximal to the lesser trochanter and completed this with an osteotome.  We placed a corkscrew guide in the femoral head and removed the femoral head in its entirety and found it to be completely devoid of cartilage.  I then cleaned the acetabulum and remnants of the acetabular labrum and other  debris deep in the acetabulum.  I placed a bent Hohmann over the medial acetabular rim and released the transverse acetabular ligament.  I then began reaming under direct visualization from a size 42 reamer all the way to size 56 and stepwise increments with the last reamer under direct fluoroscopy as well as direct visualization so I could obtain my depth of reaming, my inclination and anteversion.  Once I was pleased with this, I placed the real DePuy Sector Gription acetabular component size 56, a single screw, and a 36+ 4 polyethylene liner for that size acetabular component.  Attention was then turned to the  femur.  With the leg externally rotated to 120 degrees, extended and adducted, I was able to place a Mueller retractor medially and a Hohmann retractor behind the greater trochanter.  We released the joint capsule and used a box cutting osteotome to enter the femoral canal and a rongeur to lateralize.  We then began broaching from size 8, broach up to a size 13.  With the size 13 in place, we trialed a varus offset femoral neck and a 36+ 1.5 hip ball.  We reduced this in the acetabulum and we were pleased with leg length, offset, and stability as well as range of motion.  We then dislocated the hip and removed the trial components. We were able to then place the real Corail femoral component with varus offset size 13 and the real 36+ 1.5 ceramic hip ball and reduced this back in the acetabulum and we were pleased with stability.  We then irrigated the soft tissue with normal saline solution using pulsatile lavage.  We closed the joint capsule with interrupted #1 Ethibond suture followed by running #1 Vicryl in the tensor fascia, 0 Vicryl in the deep tissue, 2-0 Vicryl in the subcutaneous tissue, staples on the skin. Xeroform and Aquacel dressing were applied.  He was then taken off the Hana table, awakened, extubated and taken to the recovery room in stable condition.  All final counts were correct.  There were no complications noted.  Of note, Richardean Canal, PA-C assisted in the entire case.  His assistance was crucial for facilitating all aspects of this case.     Vanita Panda. Magnus Ivan, M.D.     CYB/MEDQ  D:  09/25/2015  T:  09/26/2015  Job:  [id number]

## 2015-09-26 NOTE — Discharge Instructions (Signed)

## 2015-09-26 NOTE — Progress Notes (Signed)
Physical Therapy Treatment Patient Details Name: [name] MRN: [id number] DOB: 08/08/1975 Today's Date: 09/26/2015    History of Present Illness 40 yo male s/p R THA (anterior approach). PMH includes: L THR (3/17),HTN, DM, OSA, anemia, asthma (as a child). Former smoker. BMI 39. S/p lumbar fusion 01/14/14 and 07/30/12.     PT Comments    Pt progressing well with mobility.  Follow Up Recommendations  Home health PT     Equipment Recommendations  None recommended by PT    Recommendations for Other Services OT consult     Precautions / Restrictions Precautions Precautions: Fall Restrictions Weight Bearing Restrictions: No Other Position/Activity Restrictions: WBAT    Mobility  Bed Mobility                  Transfers Overall transfer level: Needs assistance Equipment used: Rolling walker (2 wheeled) Transfers: Sit to/from Stand Sit to Stand: Min guard;From elevated surface         General transfer comment: min cues for use of UEs to self assist  Ambulation/Gait Ambulation/Gait assistance: Min guard;Supervision Ambulation Distance (Feet): 80 Feet (twice) Assistive device: Rolling walker (2 wheeled) Gait Pattern/deviations: Step-to pattern;Step-through pattern;Decreased step length - right;Decreased step length - left;Shuffle;Trunk flexed Gait velocity: decr Gait velocity interpretation: Below normal speed for age/gender General Gait Details: min cues for posture and position from [employer]   Stairs            Wheelchair Mobility    Modified Rankin (Stroke Patients Only)       Balance                                    Cognition Arousal/Alertness: Awake/alert Behavior During Therapy: WFL for tasks assessed/performed Overall Cognitive Status: Within Functional Limits for tasks assessed                      Exercises      General Comments        Pertinent Vitals/Pain Pain Assessment: 0-10 Pain Score: 6  Pain  Location: R hip Pain Descriptors / Indicators: Aching;Sore Pain Intervention(s): Monitored during session;Limited activity within patient's tolerance;Premedicated before session;Ice applied    Home Living Family/patient expects to be discharged to:: Private residence Living Arrangements: Spouse/significant other Available Help at Discharge: Family;Available PRN/intermittently Type of Home: Apartment Home Access: Level entry   Home Layout: One level Home Equipment: Walker - 4 wheels;Bedside commode;Cane - single point;Tub bench;Grab bars - toilet;Grab bars - tub/shower Additional Comments: also has reacher, sock aide, long shoe horn    Prior Function Level of Independence: Independent with assistive device(s);Independent          PT Goals (current goals can now be found in the care plan section) Acute Rehab PT Goals Patient Stated Goal: Regain IND PT Goal Formulation: With patient Time For Goal Achievement: 09/29/15 Potential to Achieve Goals: Good Progress towards PT goals: Progressing toward goals    Frequency  7X/week    PT Plan Current plan remains appropriate    Co-evaluation   Reason for Co-Treatment: For patient/therapist safety PT goals addressed during session: Mobility/safety with mobility OT goals addressed during session: ADL's and self-care     End of Session   Activity Tolerance: Patient tolerated treatment well Patient left: in chair;with call bell/phone within reach;with family/visitor present     Time: 4098-11911502-1528 PT Time Calculation (min) (ACUTE ONLY): 26 min  Charges:  $  Gait Training: 23-37 mins                    G Codes:      [name] [name] 11, 2017, 4:29 PM

## 2015-09-26 NOTE — Progress Notes (Signed)
Subjective: 1 Day Post-Op Procedure(s) (LRB): RIGHT TOTAL HIP ARTHROPLASTY ANTERIOR APPROACH (Right) Patient reports pain as moderate.    Objective: Vital signs in last 24 hours: Temp:  [97.8 F (36.6 C)-99.2 F (37.3 C)] 99 F (37.2 C) (05/20 0529) Pulse Rate:  [103-113] 113 (05/20 0529) Resp:  [14-25] 16 (05/20 0529) BP: (112-148)/(75-98) 114/83 mmHg (05/20 0529) SpO2:  [90 %-100 %] 97 % (05/20 0529) Weight:  [147.929 kg (326 lb 2 oz)] 147.929 kg (326 lb 2 oz) (05/19 1048)  Intake/Output from previous day: 05/19 0701 - 05/20 0700 In: 2741.3 [P.O.:960; I.V.:1781.3] Out: 1425 [Urine:1075; Blood:350] Intake/Output this shift:     Recent Labs  09/26/15 0354  HGB 11.0*    Recent Labs  09/26/15 0354  WBC 9.7  RBC 3.81*  HCT 34.2*  PLT 313    Recent Labs  09/26/15 0354  NA 137  K 3.5  CL 101  CO2 27  BUN 18  CREATININE 1.14  GLUCOSE 138*  CALCIUM 7.9*   No results for input(s): LABPT, INR in the last 72 hours.  Sensation intact distally Intact pulses distally Dorsiflexion/Plantar flexion intact Incision: scant drainage  Assessment/Plan: 1 Day Post-Op Procedure(s) (LRB): RIGHT TOTAL HIP ARTHROPLASTY ANTERIOR APPROACH (Right) Up with therapy Discharge home with home health - Likely Monday  Kathryne HitchBLACKMAN,CHRISTOPHER Y 09/26/2015, 10:11 AM

## 2015-09-26 NOTE — Evaluation (Signed)
Physical Therapy Evaluation Patient Details Name: [name] MRN: [id number] DOB: 1976/05/07 Today's Date: 09/26/2015   History of Present Illness  40 yo male s/p R THA (anterior approach). PMH includes: L THR (3/17),HTN, DM, OSA, anemia, asthma (as a child). Former smoker. BMI 39. S/p lumbar fusion 01/14/14 and 07/30/12.   Clinical Impression  Pt s/p R THR presents with decreased R LE strength/ROM, post op pain and obesity limiting functional mobility .  Pt should progress to dc home with family assist and HHPT follow up.    Follow Up Recommendations Home health PT    Equipment Recommendations  None recommended by PT    Recommendations for Other Services OT consult     Precautions / Restrictions Precautions Precautions: Fall Restrictions Weight Bearing Restrictions: No Other Position/Activity Restrictions: WBAT      Mobility  Bed Mobility Overal bed mobility: Needs Assistance Bed Mobility: Supine to Sit     Supine to sit: Min assist;HOB elevated     General bed mobility comments: Min assist with R LE.  Pt utilizing bed rail and HOB at 30 degrees  Transfers Overall transfer level: Needs assistance Equipment used: Rolling walker (2 wheeled) Transfers: Sit to/from Stand Sit to Stand: From elevated surface;Min assist         General transfer comment: min cues for use of UEs to self assist  Ambulation/Gait Ambulation/Gait assistance: Min guard Ambulation Distance (Feet): 78 Feet Assistive device: Rolling walker (2 wheeled) Gait Pattern/deviations: Step-to pattern;Step-through pattern;Decreased step length - right;Decreased step length - left;Shuffle;Trunk flexed Gait velocity: decr Gait velocity interpretation: Below normal speed for age/gender General Gait Details: min cues for posture and position from [employer]            Wheelchair Mobility    Modified Rankin (Stroke Patients Only)       Balance                                              Pertinent Vitals/Pain Pain Assessment: 0-10 Pain Score: 6  Pain Location: R hip Pain Descriptors / Indicators: Aching;Sore Pain Intervention(s): Limited activity within patient's tolerance;Monitored during session;Premedicated before session    Home Living Family/patient expects to be discharged to:: Private residence Living Arrangements: Spouse/significant other Available Help at Discharge: Family;Available PRN/intermittently Type of Home: Apartment Home Access: Level entry     Home Layout: One level Home Equipment: Bedside commode;Walker - 4 wheels;Cane - single point;Tub bench      Prior Function Level of Independence: Independent with assistive device(s);Independent               Hand Dominance   Dominant Hand: Right    Extremity/Trunk Assessment   Upper Extremity Assessment: Overall WFL for tasks assessed           Lower Extremity Assessment: RLE deficits/detail RLE Deficits / Details: Strength at hip 2+/5 with AAROM at hip to 75 flex and 15 abd    Cervical / Trunk Assessment: Normal  Communication   Communication: No difficulties  Cognition Arousal/Alertness: Awake/alert Behavior During Therapy: WFL for tasks assessed/performed Overall Cognitive Status: Within Functional Limits for tasks assessed                      General Comments      Exercises Total Joint Exercises Ankle Circles/Pumps: AROM;Both;15 reps;Supine Quad Sets: AROM;Both;10 reps;Supine Heel  Slides: AAROM;Right;15 reps;Supine Hip ABduction/ADduction: AAROM;Right;10 reps;Supine      Assessment/Plan    PT Assessment Patient needs continued PT services  PT Diagnosis Difficulty walking   PT Problem List Decreased strength;Decreased range of motion;Decreased activity tolerance;Decreased mobility;Pain;Obesity  PT Treatment Interventions DME instruction;Gait training;Functional mobility training;Therapeutic activities;Therapeutic exercise;Patient/family  education   PT Goals (Current goals can be found in the Care Plan section) Acute Rehab PT Goals Patient Stated Goal: Regain IND PT Goal Formulation: With patient Time For Goal Achievement: 09/29/15 Potential to Achieve Goals: Good    Frequency 7X/week   Barriers to discharge        Co-evaluation PT/OT/SLP Co-Evaluation/Treatment: Yes Reason for Co-Treatment: For patient/therapist safety PT goals addressed during session: Mobility/safety with mobility OT goals addressed during session: ADL's and self-care       End of Session   Activity Tolerance: Patient tolerated treatment well Patient left: in chair;with call bell/phone within reach;with family/visitor present Nurse Communication: Mobility status         Time: 6962-95281053-1126 PT Time Calculation (min) (ACUTE ONLY): 33 min   Charges:   PT Evaluation $PT Eval Low Complexity: 1 Procedure     PT G Codes:        [name] 09/26/2015, 1:12 PM

## 2015-09-27 LAB — GLUCOSE, CAPILLARY
GLUCOSE-CAPILLARY: 114 mg/dL — AB (ref 65–99)
Glucose-Capillary: 156 mg/dL — ABNORMAL HIGH (ref 65–99)
Glucose-Capillary: 149 mg/dL — ABNORMAL HIGH (ref 65–99)
Glucose-Capillary: 112 mg/dL — ABNORMAL HIGH (ref 65–99)

## 2015-09-27 MED ORDER — DICLOFENAC SODIUM 75 MG PO TBEC
75.0000 mg | DELAYED_RELEASE_TABLET | Freq: Two times a day (BID) | ORAL | Status: DC
  Administered 2015-09-27 – 2015-09-28 (×2): 75 mg via ORAL
  Filled 2015-09-27 (×3): qty 1

## 2015-09-27 NOTE — Progress Notes (Signed)
Physical Therapy Treatment Patient Details Name: [name] MRN: [id number] DOB: 10/13/1975 Today's Date: 09/27/2015    History of Present Illness 40 yo male s/p R THA (anterior approach). PMH includes: L THR (3/17),HTN, DM, OSA, anemia, asthma (as a child). Former smoker. BMI 39. S/p lumbar fusion 01/14/14 and 07/30/12.     PT Comments    POD # 2 am session Assisted OOB to amb in hallway then perform some THR TE's followed by ICE.  Follow Up Recommendations  Home health PT     Equipment Recommendations  None recommended by PT    Recommendations for Other Services       Precautions / Restrictions Precautions Precautions: Fall Restrictions Weight Bearing Restrictions: No Other Position/Activity Restrictions: WBAT    Mobility  Bed Mobility Overal bed mobility: Needs Assistance             General bed mobility comments: Min assist with R LE.  Pt utilizing bed rail and HOB at 30 degrees  Transfers Overall transfer level: Needs assistance Equipment used: Rolling walker (2 wheeled) Transfers: Sit to/from Stand Sit to Stand: Supervision;Min guard         General transfer comment: min cues for use of UEs to self assist  Ambulation/Gait Ambulation/Gait assistance: Supervision;Min guard Ambulation Distance (Feet): 85 Feet Assistive device: Rolling walker (2 wheeled) Gait Pattern/deviations: Step-to pattern     General Gait Details: min cues for posture and position from [employer]   Stairs            Wheelchair Mobility    Modified Rankin (Stroke Patients Only)       Balance                                    Cognition                            Exercises   Total Hip Replacement TE's 10 reps ankle pumps 10 reps knee presses 10 reps heel slides 10 reps SAQ's 10 reps ABD Followed by ICE    General Comments        Pertinent Vitals/Pain Pain Assessment: 0-10 Pain Score: 6  Pain Location: R hip Pain Descriptors /  Indicators: Grimacing;Sore;Aching;Tightness Pain Intervention(s): Monitored during session;Repositioned;Ice applied    Home Living                      Prior Function            PT Goals (current goals can now be found in the care plan section) Progress towards PT goals: Progressing toward goals    Frequency  7X/week    PT Plan Current plan remains appropriate    Co-evaluation             End of Session Equipment Utilized During Treatment: Gait belt Activity Tolerance: Patient tolerated treatment well Patient left: in chair;with call bell/phone within reach;with family/visitor present     Time: 4098-11910947-1019 PT Time Calculation (min) (ACUTE ONLY): 32 min  Charges:  $Gait Training: 8-22 mins $Therapeutic Exercise: 8-22 mins                    G Codes:      Felecia ShellingLori Kimila Papaleo  PTA [facility]  Acute  Rehab Pager      [phone]

## 2015-09-27 NOTE — Progress Notes (Signed)
Patient ID: [name], male   DOB: 08/04/1975, 40 y.o.   MRN: [id number] Patient is status post right total hip arthroplasty. He is making slow steady progress and plan for discharge to home Monday. No new labs today previous hemoglobin 11.0

## 2015-09-27 NOTE — Progress Notes (Signed)
Physical Therapy Treatment Patient Details Name: [name] MRN: [id number] DOB: 06/03/1975 Today's Date: 09/27/2015    History of Present Illness 40 yo male s/p R THA (anterior approach). PMH includes: L THR (3/17),HTN, DM, OSA, anemia, asthma (as a child). Former smoker. BMI 39. S/p lumbar fusion 01/14/14 and 07/30/12.     PT Comments    POD # 2 pm session Pt amb a greater distance and performed some standing TE's.  Follow Up Recommendations  Home health PT     Equipment Recommendations  None recommended by PT    Recommendations for Other Services       Precautions / Restrictions Precautions Precautions: Fall Restrictions Weight Bearing Restrictions: No Other Position/Activity Restrictions: WBAT    Mobility  Bed Mobility Overal bed mobility: Needs Assistance             General bed mobility comments: Pt OOB in recliner  Transfers Overall transfer level: Needs assistance Equipment used: Rolling walker (2 wheeled) Transfers: Sit to/from Stand Sit to Stand: Supervision;Min guard         General transfer comment: min cues for use of UEs to self assist  Ambulation/Gait Ambulation/Gait assistance: Supervision;Min guard Ambulation Distance (Feet): 115 Feet Assistive device: Rolling walker (2 wheeled) Gait Pattern/deviations: Step-to pattern     General Gait Details: min cues for posture and position from RW   Stairs            Wheelchair Mobility    Modified Rankin (Stroke Patients Only)       Balance                                    Cognition                            Exercises   10 reps all standing TE's   General Comments        Pertinent Vitals/Pain Pain Assessment: 0-10 Pain Score: 6  Pain Location: R hip Pain Descriptors / Indicators: Grimacing;Sore;Aching;Tightness Pain Intervention(s): Monitored during session;Repositioned;Ice applied    Home Living                      Prior  Function            PT Goals (current goals can now be found in the care plan section) Progress towards PT goals: Progressing toward goals    Frequency  7X/week    PT Plan Current plan remains appropriate    Co-evaluation             End of Session Equipment Utilized During Treatment: Gait belt Activity Tolerance: Patient tolerated treatment well Patient left: in chair;with call bell/phone within reach;with family/visitor present     Time: 1416-1450 PT Time Calculation (min) (ACUTE ONLY): 34 min  Charges:  $Gait Training: 8-22 mins $Therapeutic Activity: 8-22 mins                    G Codes:      Felecia ShellingLori Avigdor Dollar  PTA [facility]  Acute  Rehab Pager      [phone]

## 2015-09-27 NOTE — Care Management Note (Signed)
Case Management Note  Patient Details  Name: Madelin RearDarius D Mcelroy MRN: [id number] Date of Birth: 07/04/1975  Subjective/Objective:                  s/p R THA (anterior approach  Action/Plan: CM spoke with the patient at the bedside. [facility]. Patient is agreeable. Reports he has a RW but needs replacement black covers for the 2 back legs of the walker. Reports the others from his previous surgery have worn out. [facility] aware and will attempt to locate "skis" for the bariatric walker the patient has in his room from home. He will have assistance from his father at discharge.   Expected Discharge Date:   09/28/15               Expected Discharge Plan:  [name]  In-House Referral:     Discharge planning Services  CM Consult  Post Acute Care Choice:  Home Health Choice offered to:  Patient  DME Arranged:  N/A DME Agency:     HH Arranged:  PT HH Agency[facility]  Status of Service:  Completed, signed off  Medicare Important Message Given:    Date Medicare IM Given:    Medicare IM give by:    Date Additional Medicare IM Given:    Additional Medicare Important Message give by:     If discussed at Long Length of Stay Meetings, dates discussed:    Additional Comments:  Antony HasteBennett, Elzia Hott Harris, RN 09/27/2015, 12:36 PM

## 2015-09-28 LAB — HEMOGLOBIN A1C
Hgb A1c MFr Bld: 7 % — ABNORMAL HIGH (ref 4.8–5.6)
Mean Plasma Glucose: 154 mg/dL

## 2015-09-28 LAB — GLUCOSE, CAPILLARY
Glucose-Capillary: 126 mg/dL — ABNORMAL HIGH (ref 65–99)
Glucose-Capillary: 99 mg/dL (ref 65–99)

## 2015-09-28 MED ORDER — ASPIRIN 325 MG PO TBEC: 325.0000 mg | DELAYED_RELEASE_TABLET | Freq: Two times a day (BID) | ORAL | Status: DC

## 2015-09-28 MED ORDER — OXYCODONE-ACETAMINOPHEN 5-325 MG PO TABS: 1.0000 | ORAL_TABLET | Freq: Three times a day (TID) | ORAL | Status: DC | PRN

## 2015-09-28 MED ORDER — METHOCARBAMOL 500 MG PO TABS: 500.0000 mg | ORAL_TABLET | Freq: Four times a day (QID) | ORAL | Status: DC | PRN

## 2015-09-28 NOTE — Discharge Summary (Signed)
Patient ID: [name] MRN: [id number] DOB/AGE: 42/09/1975 42 y.o.  Admit date: 09/25/2015 Discharge date: 09/28/2015  Admission Diagnoses:  Principal Problem:   Osteoarthritis of right hip Active Problems:   Status post total replacement of right hip   Discharge Diagnoses:  Same  Past Medical History  Diagnosis Date  . Hypertension   . Diabetes mellitus without complication (HCC)   . Arthritis   . Anemia   . Asthma     as a child  . H/O transfusion of packed red blood cells     with prior lumbar fusions x2  . Anginal pain (HCC) several yrs ago  . Sleep apnea     wore CPAP prior to tonsillectomy    Surgeries: Procedure(s): RIGHT TOTAL HIP ARTHROPLASTY ANTERIOR APPROACH on 09/25/2015   Consultants:    Discharged Condition: Improved  Hospital Course: [name] is an 40 y.o. male who was admitted 09/25/2015 for operative treatment ofOsteoarthritis of right hip. Patient has severe unremitting pain that affects sleep, daily activities, and work/hobbies. After pre-op clearance the patient was taken to the operating room on 09/25/2015 and underwent  Procedure(s): RIGHT TOTAL HIP ARTHROPLASTY ANTERIOR APPROACH.    Patient was given perioperative antibiotics: Anti-infectives    Start     Dose/Rate Route Frequency Ordered Stop   09/25/15 1800  ceFAZolin (ANCEF) IVPB 2g/100 mL premix     2 g 200 mL/hr over 30 Minutes Intravenous Every 6 hours 09/25/15 1454 09/26/15 0019   09/25/15 0600  ceFAZolin (ANCEF) 3 g in dextrose 5 % 50 mL IVPB     3 g 130 mL/hr over 30 Minutes Intravenous On call to O.R. 09/24/15 1349 09/25/15 1206       Patient was given sequential compression devices, early ambulation, and chemoprophylaxis to prevent DVT.  Patient benefited maximally from hospital stay and there were no complications.    Recent vital signs: Patient Vitals for the past 24 hrs:  BP Temp Temp src Pulse Resp SpO2  09/28/15 0624 111/75 mmHg 97.9 F (36.6 C) Oral 99 16 98 %   09/27/15 2151 134/84 mmHg (!) 100.4 F (38 C) Oral (!) 125 18 96 %  09/27/15 1457 128/81 mmHg 99.1 F (37.3 C) Oral (!) 125 18 96 %     Recent laboratory studies:  Recent Labs  09/26/15 0354  WBC 9.7  HGB 11.0*  HCT 34.2*  PLT 313  NA 137  K 3.5  CL 101  CO2 27  BUN 18  CREATININE 1.14  GLUCOSE 138*  CALCIUM 7.9*     Discharge Medications:     Medication List    STOP taking these medications        diclofenac 75 MG EC tablet  Commonly known as:  VOLTAREN      TAKE these medications        aspirin 325 MG EC tablet  Take 1 tablet (325 mg total) by mouth 2 (two) times daily after a meal.     canagliflozin 100 MG Tabs tablet  Commonly known as:  INVOKANA  Take 1 tablet (100 mg total) by mouth daily before breakfast.     cyclobenzaprine 10 MG tablet  Commonly known as:  FLEXERIL  Take 10 mg by mouth 3 (three) times daily as needed for muscle spasms.     EPINEPHrine 0.3 mg/0.3 mL Soaj injection  Commonly known as:  EPI-PEN  Inject 0.3 mg into the muscle once as needed (For anaphylaxis.).     metFORMIN 1000  MG tablet  Commonly known as:  GLUCOPHAGE  Take 1,000 mg by mouth 2 (two) times daily with a meal.     methocarbamol 500 MG tablet  Commonly known as:  ROBAXIN  Take 1 tablet (500 mg total) by mouth every 6 (six) hours as needed for muscle spasms.     OVER THE COUNTER MEDICATION  Take 2 capsules by mouth 2 (two) times daily. Nature Cure Bee Caps     oxyCODONE-acetaminophen 5-325 MG tablet  Commonly known as:  PERCOCET/ROXICET  Take 1-2 tablets by mouth every 8 (eight) hours as needed (For pain.).     triamterene-hydrochlorothiazide 75-50 MG tablet  Commonly known as:  MAXZIDE  Take 1 tablet by mouth daily.     TYLENOL ARTHRITIS PAIN 650 MG CR tablet  Generic drug:  acetaminophen  Take 1,300 mg by mouth at bedtime.        Diagnostic Studies: Dg C-arm 1-60 Min-no Report  09/25/2015  CLINICAL DATA: surgery C-ARM 1-60 MINUTES Fluoroscopy was  utilized by the requesting physician.  No radiographic interpretation.   Dg Hip Port Unilat With Pelvis 1v Right  09/25/2015  CLINICAL DATA:  Status post total replacement of right hip. EXAM: DG HIP (WITH OR WITHOUT PELVIS) 1V PORT RIGHT COMPARISON:  Intraoperative fluoroscopic spot images from earlier same day and plain films of the pelvis dated 08/04/2015. FINDINGS: Patient is status post total right hip replacement. Hardware appears appropriately positioned. No evidence of surgical complicating feature. Left hip replacement hardware is stable in position. IMPRESSION: Status post total right hip replacement. Hardware appears intact and appropriately positioned. No evidence of surgical complicating feature. Electronically Signed   By: Bary Richard M.D.   On: 09/25/2015 14:26   Dg Hip Operative Unilat With Pelvis Right  09/25/2015  CLINICAL DATA:  Right total hip replacement EXAM: DG C-ARM 1-60 MIN-NO REPORT; OPERATIVE RIGHT HIP WITH PELVIS FLUOROSCOPY TIME:  Fluoroscopy Time (in minutes and seconds): 14 seconds Number of Acquired Images:  3 COMPARISON:  08/04/2015 FINDINGS: Three intraoperative views of the right hip submitted. There is right hip prosthesis with anatomic alignment. Stable left hip prosthesis position. IMPRESSION: Right hip prosthesis with anatomic alignment. Stable left hip prosthesis alignment. Please see the operative report. Electronically Signed   By: Natasha Mead M.D.   On: 09/25/2015 13:40    Disposition: 06-Home-Health Care Svc      Discharge Instructions    Discharge patient    Complete by:  As directed            Follow-up Information    Follow up with Kathryne Hitch, MD. Schedule an appointment as soon as possible for a visit in 2 weeks.   Specialty:  Orthopedic Surgery   Contact information:   [address] [place] [address] [phone]       Follow up with [facility].   Why:  home health physical therapy   Contact information:    [address] [place] [address] [phone]       Follow up with Kathryne Hitch, MD In 2 weeks.   Specialty:  Orthopedic Surgery   Contact information:   [address] [place] [address] [phone]        Signed: Kathryne Hitch 09/28/2015, 7:24 AM

## 2015-09-28 NOTE — Progress Notes (Signed)
Subjective: 3 Days Post-Op Procedure(s) (LRB): RIGHT TOTAL HIP ARTHROPLASTY ANTERIOR APPROACH (Right) Patient reports pain as moderate.    Objective: Vital signs in last 24 hours: Temp:  [97.9 F (36.6 C)-100.4 F (38 C)] 97.9 F (36.6 C) (05/22 0624) Pulse Rate:  [99-125] 99 (05/22 0624) Resp:  [16-18] 16 (05/22 0624) BP: (111-134)/(75-84) 111/75 mmHg (05/22 0624) SpO2:  [96 %-98 %] 98 % (05/22 0624)  Intake/Output from previous day: 05/21 0701 - 05/22 0700 In: 240 [P.O.:240] Out: 2550 [Urine:2550] Intake/Output this shift:     Recent Labs  09/26/15 0354  HGB 11.0*    Recent Labs  09/26/15 0354  WBC 9.7  RBC 3.81*  HCT 34.2*  PLT 313    Recent Labs  09/26/15 0354  NA 137  K 3.5  CL 101  CO2 27  BUN 18  CREATININE 1.14  GLUCOSE 138*  CALCIUM 7.9*   No results for input(s): LABPT, INR in the last 72 hours.  Sensation intact distally Intact pulses distally Dorsiflexion/Plantar flexion intact Incision: scant drainage  Assessment/Plan: 3 Days Post-Op Procedure(s) (LRB): RIGHT TOTAL HIP ARTHROPLASTY ANTERIOR APPROACH (Right) Up with therapy Discharge home with home health today.  BLACKMAN,CHRISTOPHER Y 09/28/2015, 7:22 AM

## 2015-09-28 NOTE — Progress Notes (Signed)
Physical Therapy Treatment Patient Details Name: [name] MRN: [id number] DOB: 07/24/1975 Today's Date: 09/28/2015    History of Present Illness 40 yo male s/p R THA (anterior approach). PMH includes: L THR (3/17),HTN, DM, OSA, anemia, asthma (as a child). Former smoker. BMI 39. S/p lumbar fusion 01/14/14 and 07/30/12.     PT Comments    POD # 3 Pt eager to D/C to home.  amb a great distance in hallway, performed standing TE then applied ICE.  Follow Up Recommendations  Home health PT     Equipment Recommendations  None recommended by PT    Recommendations for Other Services       Precautions / Restrictions Precautions Precautions: Fall Restrictions Weight Bearing Restrictions: No Other Position/Activity Restrictions: WBAT    Mobility  Bed Mobility               General bed mobility comments: Pt OOB in recliner  Transfers Overall transfer level: Needs assistance Equipment used: Rolling walker (2 wheeled) Transfers: Sit to/from Stand Sit to Stand: Supervision         General transfer comment: good safety cognition  Ambulation/Gait Ambulation/Gait assistance: Supervision Ambulation Distance (Feet): 225 Feet Assistive device: Rolling walker (2 wheeled) Gait Pattern/deviations: Step-to pattern;Step-through pattern     General Gait Details: steady   Stairs Stairs:  (no stairs to enter home)          Wheelchair Mobility    Modified Rankin (Stroke Patients Only)       Balance  10 reps all standing TE's                                  Cognition                            Exercises      General Comments        Pertinent Vitals/Pain Pain Assessment: 0-10 Pain Score: 3  Pain Location: R hip Pain Descriptors / Indicators: Grimacing Pain Intervention(s): Monitored during session;Ice applied;Repositioned    Home Living                      Prior Function            PT Goals (current goals  can now be found in the care plan section) Progress towards PT goals: Progressing toward goals    Frequency  7X/week    PT Plan Current plan remains appropriate    Co-evaluation             End of Session Equipment Utilized During Treatment: Gait belt Activity Tolerance: Patient tolerated treatment well Patient left: in chair;with call bell/phone within reach;with family/visitor present     Time: 5621-30861052-1120 PT Time Calculation (min) (ACUTE ONLY): 28 min  Charges:  $Gait Training: 8-22 mins $Therapeutic Exercise: 8-22 mins                    G Codes:      Felecia ShellingLori Tulsi Crossett  PTA [facility]  Acute  Rehab Pager      [phone]

## 2015-10-02 IMAGING — CR DG CHEST 2V
2 series · 2 of 2 positions shown · non-contrast
Comparison: None.

CLINICAL DATA: Hypertension.  Preoperative for lumbar fusion.

EXAM:
CHEST  2 VIEW

[w chest pa]
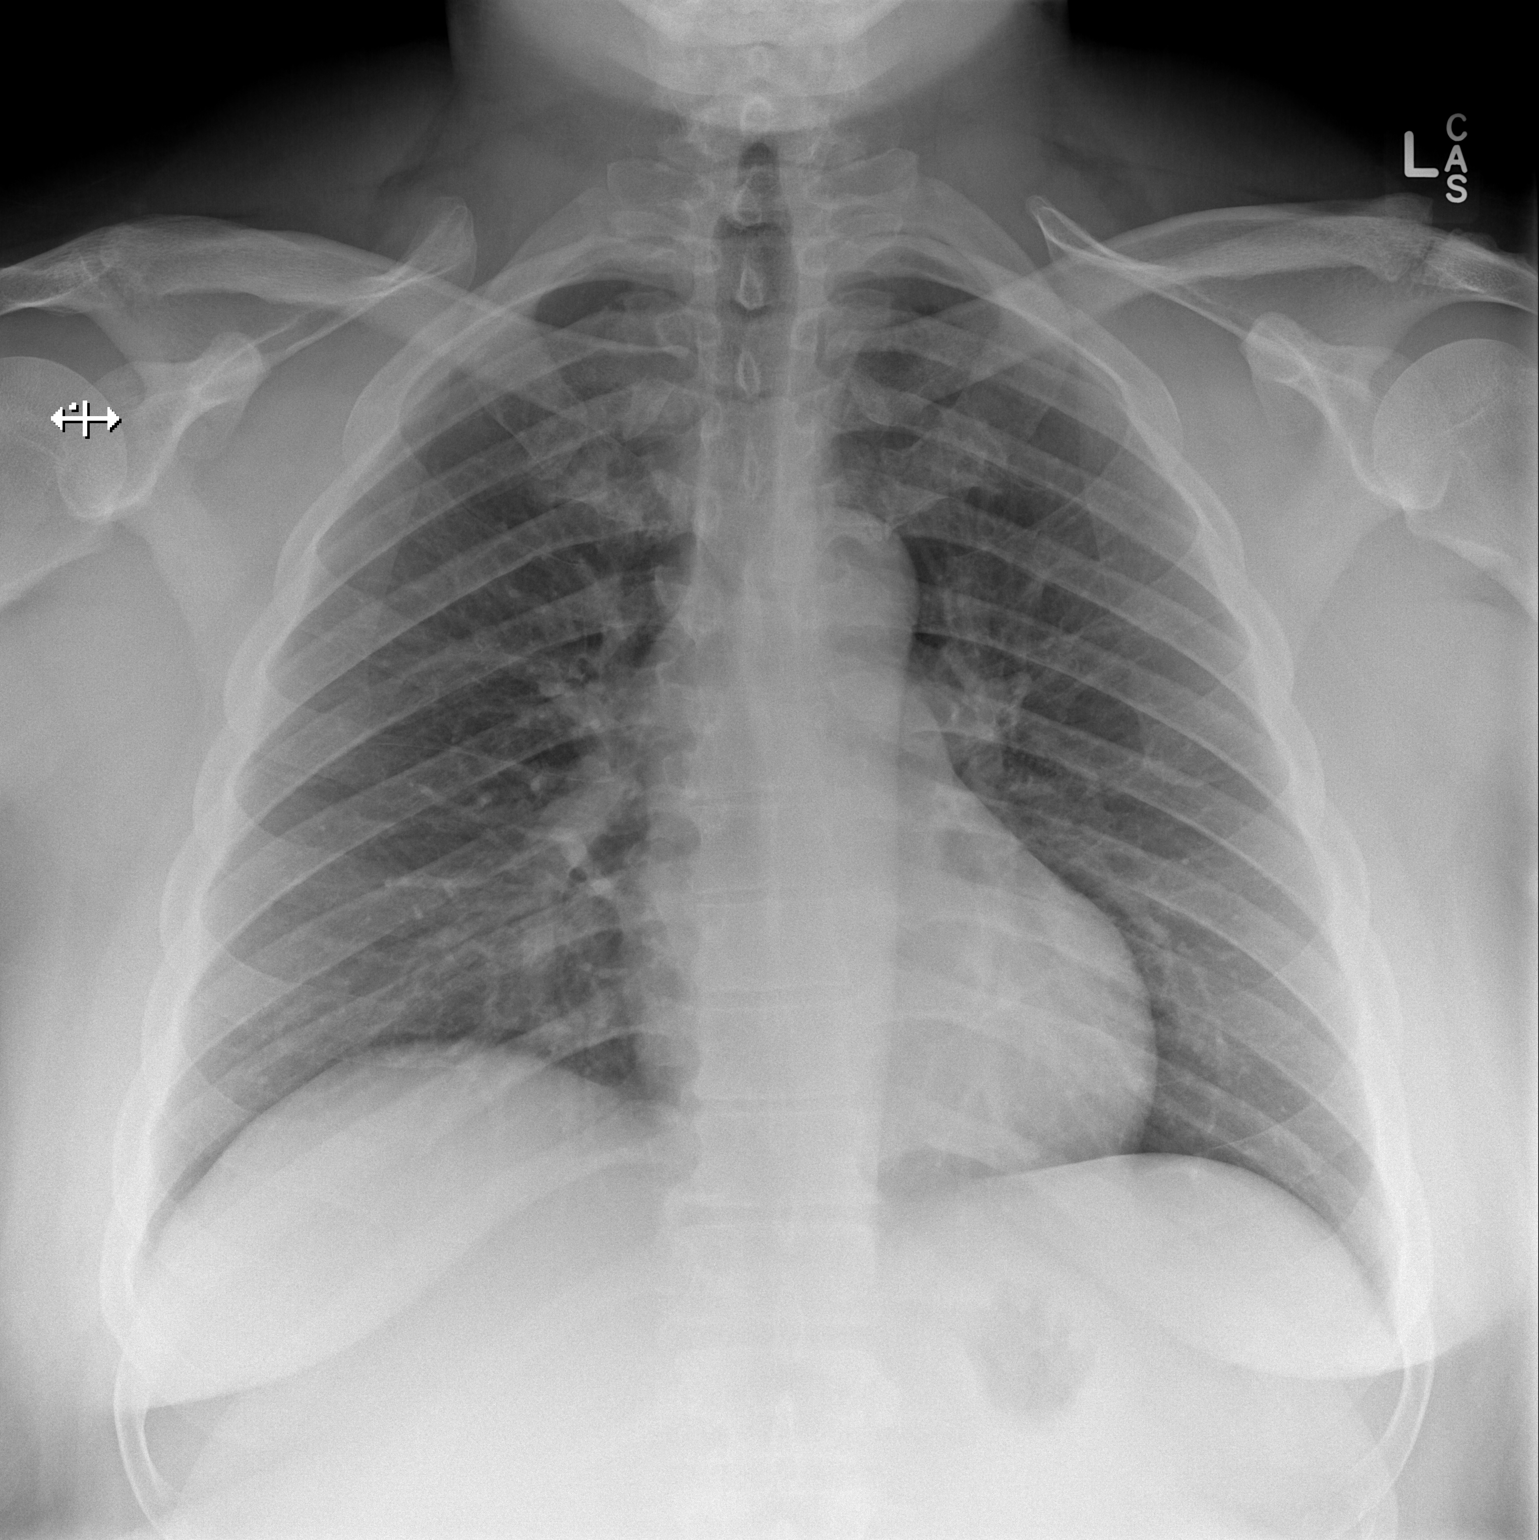

[w chest lat]
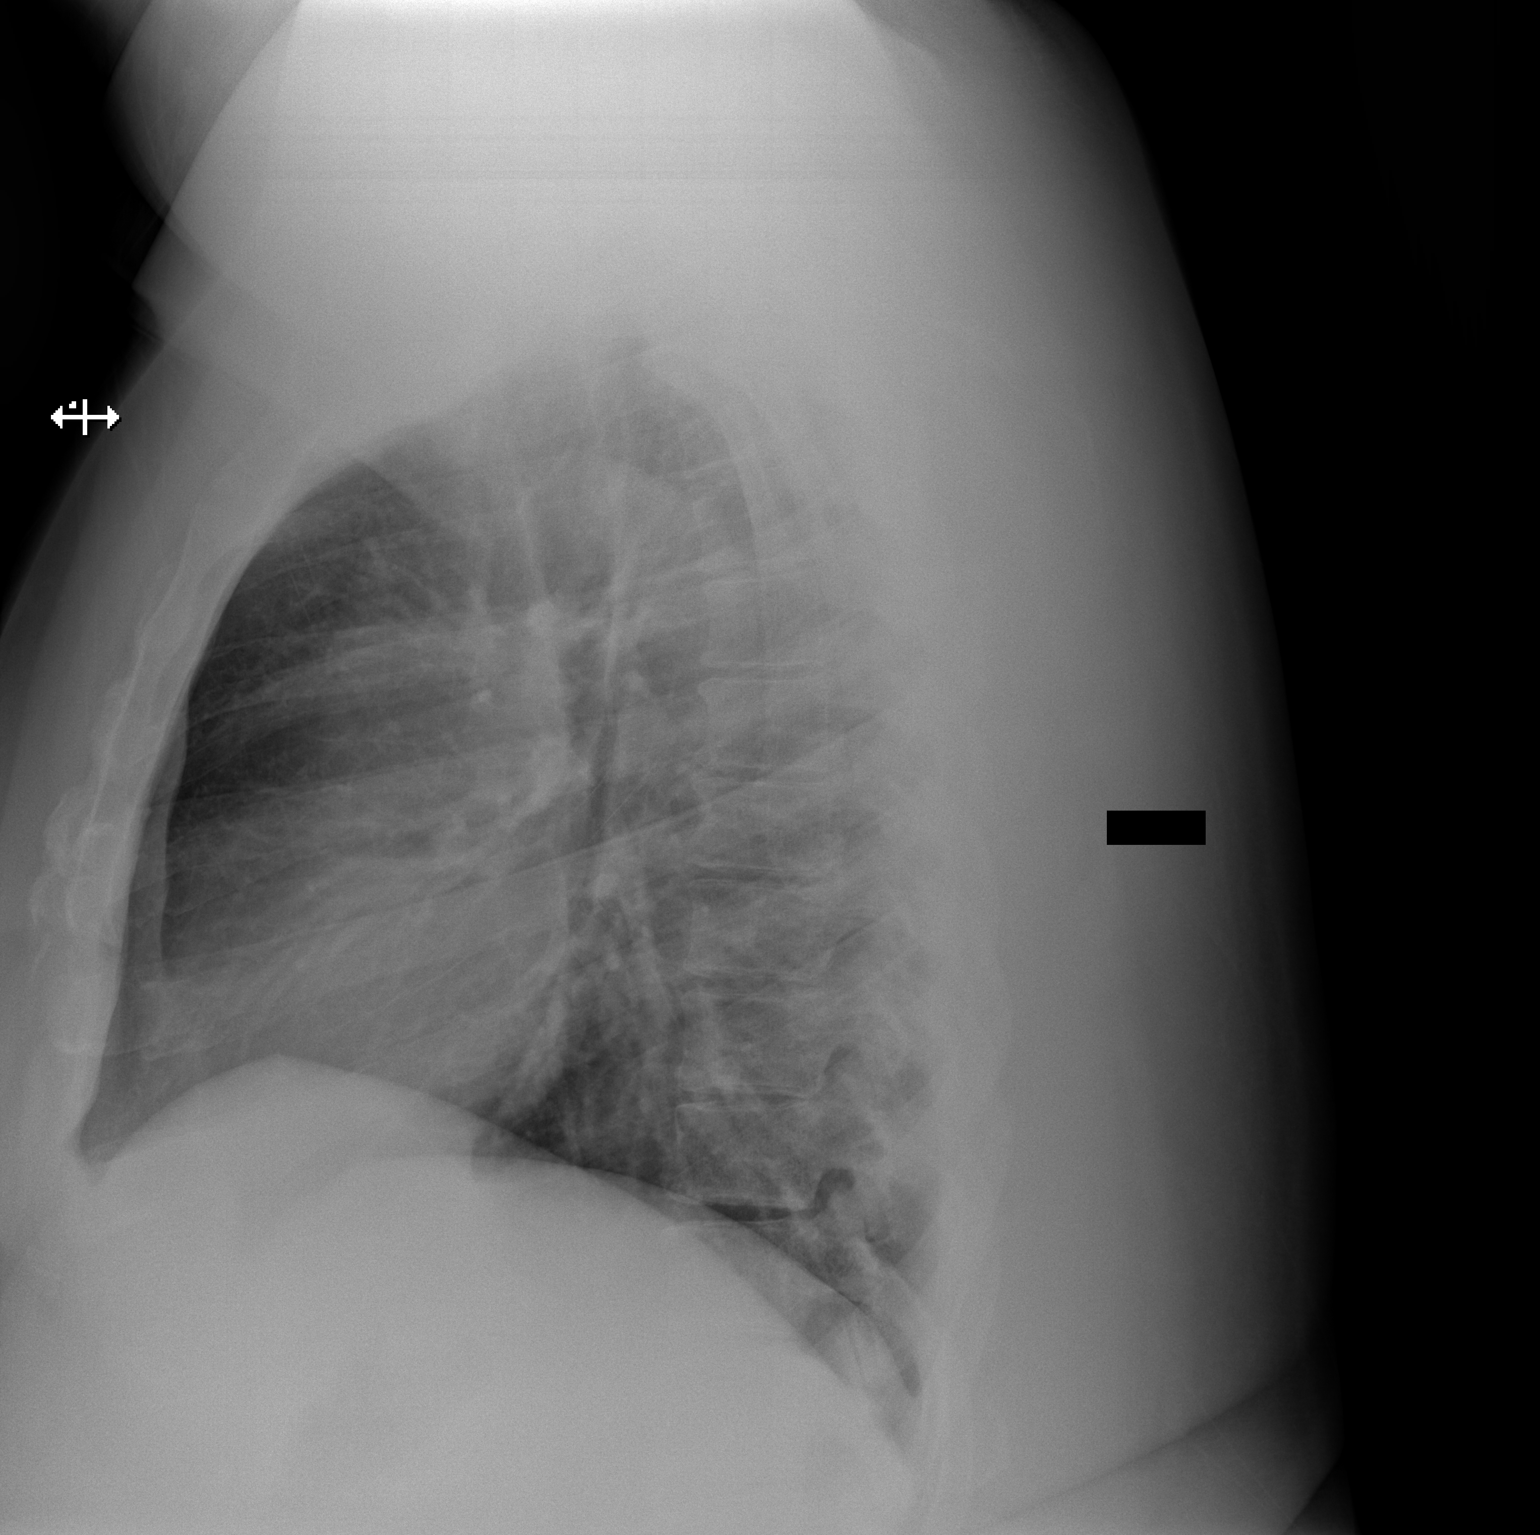

[2 of 2 positions shown; findings below may reference images not displayed]

FINDINGS: The heart size and mediastinal contours are within normal limits.
There is no focal infiltrate, pulmonary edema, or pleural effusion.
The visualized skeletal structures are unremarkable.
IMPRESSION: No active cardiopulmonary disease.

## 2016-04-29 DIAGNOSIS — Z87891 Personal history of nicotine dependence: Secondary | ICD-10-CM | POA: Insufficient documentation

## 2016-04-29 DIAGNOSIS — Z7982 Long term (current) use of aspirin: Secondary | ICD-10-CM | POA: Insufficient documentation

## 2016-04-29 DIAGNOSIS — Z96643 Presence of artificial hip joint, bilateral: Secondary | ICD-10-CM | POA: Insufficient documentation

## 2016-04-29 DIAGNOSIS — I10 Essential (primary) hypertension: Secondary | ICD-10-CM | POA: Insufficient documentation

## 2016-04-29 DIAGNOSIS — J45909 Unspecified asthma, uncomplicated: Secondary | ICD-10-CM | POA: Insufficient documentation

## 2016-04-29 DIAGNOSIS — Z7984 Long term (current) use of oral hypoglycemic drugs: Secondary | ICD-10-CM | POA: Diagnosis not present

## 2016-04-29 DIAGNOSIS — R05 Cough: Secondary | ICD-10-CM | POA: Diagnosis present

## 2016-04-29 DIAGNOSIS — E1165 Type 2 diabetes mellitus with hyperglycemia: Secondary | ICD-10-CM | POA: Diagnosis not present

## 2016-04-29 DIAGNOSIS — J069 Acute upper respiratory infection, unspecified: Secondary | ICD-10-CM | POA: Insufficient documentation

## 2016-04-30 ENCOUNTER — Encounter (HOSPITAL_COMMUNITY): Payer: Self-pay | Admitting: *Deleted

## 2016-04-30 ENCOUNTER — Emergency Department (HOSPITAL_COMMUNITY)
Admission: EM | Admit: 2016-04-30 | Discharge: 2016-04-30 | Disposition: A | Discharge status: Home / Self Care | Payer: BC Managed Care – PPO | Attending: Emergency Medicine | Admitting: Emergency Medicine

## 2016-04-30 ENCOUNTER — Emergency Department (HOSPITAL_COMMUNITY): Payer: BC Managed Care – PPO

## 2016-04-30 DIAGNOSIS — J069 Acute upper respiratory infection, unspecified: Secondary | ICD-10-CM

## 2016-04-30 DIAGNOSIS — R739 Hyperglycemia, unspecified: Secondary | ICD-10-CM

## 2016-04-30 DIAGNOSIS — B9789 Other viral agents as the cause of diseases classified elsewhere: Secondary | ICD-10-CM

## 2016-04-30 LAB — BASIC METABOLIC PANEL
ANION GAP: 11 (ref 5–15)
BUN: 29 mg/dL — ABNORMAL HIGH (ref 6–20)
CALCIUM: 9 mg/dL (ref 8.9–10.3)
CO2: 25 mmol/L (ref 22–32)
Chloride: 94 mmol/L — ABNORMAL LOW (ref 101–111)
Creatinine, Ser: 1.4 mg/dL — ABNORMAL HIGH (ref 0.61–1.24)
GLUCOSE: 456 mg/dL — AB (ref 65–99)
Potassium: 4.3 mmol/L (ref 3.5–5.1)
Sodium: 130 mmol/L — ABNORMAL LOW (ref 135–145)

## 2016-04-30 LAB — CBG MONITORING, ED
GLUCOSE-CAPILLARY: 434 mg/dL — AB (ref 65–99)
Glucose-Capillary: 396 mg/dL — ABNORMAL HIGH (ref 65–99)

## 2016-04-30 LAB — CBC WITH DIFFERENTIAL/PLATELET
BASOS ABS: 0.1 10*3/uL (ref 0.0–0.1)
BASOS PCT: 1 %
EOS PCT: 4 %
Eosinophils Absolute: 0.3 10*3/uL (ref 0.0–0.7)
HCT: 40.9 % (ref 39.0–52.0)
Hemoglobin: 13.8 g/dL (ref 13.0–17.0)
Lymphocytes Relative: 41 %
Lymphs Abs: 3.7 10*3/uL (ref 0.7–4.0)
MCH: 29.9 pg (ref 26.0–34.0)
MCHC: 33.7 g/dL (ref 30.0–36.0)
MCV: 88.7 fL (ref 78.0–100.0)
MONO ABS: 0.6 10*3/uL (ref 0.1–1.0)
Monocytes Relative: 7 %
Neutro Abs: 4.3 10*3/uL (ref 1.7–7.7)
Neutrophils Relative %: 47 %
PLATELETS: 320 10*3/uL (ref 150–400)
RBC: 4.61 MIL/uL (ref 4.22–5.81)
RDW: 13.9 % (ref 11.5–15.5)
WBC: 9 10*3/uL (ref 4.0–10.5)

## 2016-04-30 LAB — I-STAT TROPONIN, ED: Troponin i, poc: 0 ng/mL (ref 0.00–0.08)

## 2016-04-30 MED ORDER — SODIUM CHLORIDE 0.9 % IV BOLUS (SEPSIS)
500.0000 mL | Freq: Once | INTRAVENOUS | Status: AC
Start: 2016-04-30 — End: 2016-04-30
  Administered 2016-04-30: 500 mL via INTRAVENOUS

## 2016-04-30 MED ORDER — SODIUM CHLORIDE 0.9 % IV BOLUS (SEPSIS)
500.0000 mL | Freq: Once | INTRAVENOUS | Status: AC
  Administered 2016-04-30: 500 mL via INTRAVENOUS

## 2016-04-30 MED ORDER — BENZONATATE 100 MG PO CAPS: 100.0000 mg | ORAL_CAPSULE | Freq: Three times a day (TID) | ORAL | 0 refills | Status: DC | PRN

## 2016-04-30 NOTE — ED Triage Notes (Signed)
Pt NAD.  Pt stated "saw my doctor on Monday & was given a Z-pack.  Had this cough x 1 month.  Was coughing up green last week.  Now it's dry and makes my chest tickle and makes me want to cough again."

## 2016-04-30 NOTE — ED Notes (Addendum)
Pt LS clear all fields.  Pt also stated "they did not do a chest x-ray @ the doctor.  Also, my BS is being high.  It was 430 tonight."  Pt denies having been on prednisone.

## 2016-04-30 NOTE — ED Notes (Signed)
MD at bedside. 

## 2016-04-30 NOTE — ED Notes (Signed)
RN to put in IV and get labs.

## 2016-04-30 NOTE — ED Provider Notes (Signed)
[facility]-EMERGENCY DEPT Provider Note   CSN: [id number] Arrival date & time: 04/29/16  2337   By signing my name below, I, [name], attest that this documentation has been prepared under the direction and in the presence of No att. providers found. Electronically signed, [name], ED Scribe. 04/30/16. 7:48 AM.   History   Chief Complaint Chief Complaint  Patient presents with  . Cough   The history is provided by the patient and medical records. No language interpreter was used.    HPI Comments: [name] is a 40 y.o. male with Hx of DM who presents to the Emergency Department complaining of SOB and high blood sugar x 1 week. He reports associated dry cough, and he states that his current symptoms feel like prior pneumonia symptoms. He states that he last checked his blood sugar 1 week ago, and had not checked it in months prior to this. He notes being treated for persistent sinus issue x 1 month, and he states that he was treated for a URI 6 days ago with z pack. He states that with that URI, he had associated sore throat, nasal drainage, and difficulty swallowing, but his symptoms have mostly subsided currently. He reveals that he has taken 1000 mg of metformin x 4 years to treat DM. Pt denies chest pain, fever, leg swelling, N/V/D and recent use of steroid medications.  Past Medical History:  Diagnosis Date  . Anemia   . Anginal pain (HCC) several yrs ago  . Arthritis   . Asthma    as a child  . Diabetes mellitus without complication (HCC)   . H/O transfusion of packed red blood cells    with prior lumbar fusions x2  . Hypertension   . Sleep apnea    wore CPAP prior to tonsillectomy    Patient Active Problem List   Diagnosis Date Noted  . Status post total replacement of right hip 09/25/2015  . Osteoarthritis of right hip 07/24/2015  . Morbid obesity (HCC) 07/24/2015  . Osteoarthritis of left hip 07/21/2015  . Status post total replacement of left hip  07/21/2015  . Lumbar stenosis with neurogenic claudication 01/14/2014  . Spinal stenosis, lumbar region, with neurogenic claudication 07/30/2012    Past Surgical History:  Procedure Laterality Date  . BACK SURGERY  march 2014 and sept 2015   lumbar fusion mrach L 3 to L 4 to L 5, sept L 2 to L 3  . TONSILLECTOMY  2005 march   for sleep apnea  . TOTAL HIP ARTHROPLASTY Left 07/21/2015   Procedure: LEFT TOTAL HIP ARTHROPLASTY ANTERIOR APPROACH;  Surgeon: Kathryne Hitchhristopher Y Blackman, MD;  Location: [facility] OR;  Service: Orthopedics;  Laterality: Left;  . TOTAL HIP ARTHROPLASTY Right 09/25/2015   Procedure: RIGHT TOTAL HIP ARTHROPLASTY ANTERIOR APPROACH;  Surgeon: Kathryne Hitchhristopher Y Blackman, MD;  Location: [facility] ORS;  Service: Orthopedics;  Laterality: Right;  . WISDOM TOOTH EXTRACTION         Home Medications    Prior to Admission medications   Medication Sig Start Date End Date Taking? Authorizing Provider  aspirin EC 81 MG tablet Take 81 mg by mouth daily.   Yes Historical Provider, MD  CARTIA XT 240 MG 24 hr capsule Take 240 mg by mouth daily. 04/25/16  Yes Historical Provider, MD  cyclobenzaprine (FLEXERIL) 10 MG tablet Take 10 mg by mouth 4 (four) times daily.  07/11/15  Yes Historical Provider, MD  diclofenac (VOLTAREN) 75 MG EC tablet Take 75 mg by  mouth 2 (two) times daily.   Yes Historical Provider, MD  EPINEPHrine 0.3 mg/0.3 mL IJ SOAJ injection Inject 0.3 mg into the muscle once as needed (For anaphylaxis.).    Yes Historical Provider, MD  Exenatide ER (BYDUREON) 2 MG PEN Inject 2 mg into the skin every Monday.   Yes Historical Provider, MD  ibuprofen (ADVIL,MOTRIN) 800 MG tablet Take 800 mg by mouth daily.   Yes Historical Provider, MD  metFORMIN (GLUCOPHAGE) 1000 MG tablet Take 1,000 mg by mouth 2 (two) times daily with a meal.   Yes Historical Provider, MD  simvastatin (ZOCOR) 10 MG tablet Take 10 mg by mouth daily. 04/25/16  Yes Historical Provider, MD  triamterene-hydrochlorothiazide  (MAXZIDE) 75-50 MG tablet Take 1 tablet by mouth daily. 08/03/15  Yes Daniel J Angiulli, PA-C  aspirin EC 325 MG EC tablet Take 1 tablet (325 mg total) by mouth 2 (two) times daily after a meal. Patient not taking: Reported on 04/30/2016 09/28/15   Kathryne Hitchhristopher Y Blackman, MD  benzonatate (TESSALON) 100 MG capsule Take 1 capsule (100 mg total) by mouth 3 (three) times daily as needed for cough. 04/30/16   Tilden FossaElizabeth Loras Grieshop, MD  canagliflozin [facility](INVOKANA) 100 MG TABS tablet Take 1 tablet (100 mg total) by mouth daily before breakfast. Patient not taking: Reported on 09/11/2015 08/03/15   Mcarthur Rossettianiel J Angiulli, PA-C  methocarbamol (ROBAXIN) 500 MG tablet Take 1 tablet (500 mg total) by mouth every 6 (six) hours as needed for muscle spasms. Patient not taking: Reported on 04/30/2016 09/28/15   Kathryne Hitchhristopher Y Blackman, MD  oxyCODONE-acetaminophen (PERCOCET/ROXICET) 5-325 MG tablet Take 1-2 tablets by mouth every 8 (eight) hours as needed (For pain.). Patient not taking: Reported on 04/30/2016 09/28/15   Kathryne Hitchhristopher Y Blackman, MD    Family History No family history on file.  Social History Social History  Substance Use Topics  . Smoking status: Former Smoker    Packs/day: 0.50    Years: 20.00    Types: Cigarettes    Quit date: 07/09/2012  . Smokeless tobacco: Never Used  . Alcohol use No     Allergies   Bee venom   Review of Systems Review of Systems  All other systems reviewed and are negative.  A complete 10 system review of systems was obtained and all systems are negative except as noted in the HPI and PMH.    Physical Exam Updated Vital Signs BP (!) 141/102   Pulse 108   Temp 98.5 F (36.9 C) (Oral)   Resp 25   Ht 6\' 2"  (1.88 m)   Wt (!) 350 lb (158.8 kg)   SpO2 92%   BMI 44.94 kg/m   Physical Exam  Constitutional: He is oriented to person, place, and time. He appears well-developed and well-nourished.  HENT:  Head: Normocephalic and atraumatic.  Cardiovascular: Normal rate and  regular rhythm.   No murmur heard. Pulmonary/Chest: Effort normal and breath sounds normal. No respiratory distress.  Abdominal: Soft. There is no tenderness. There is no rebound and no guarding.  Musculoskeletal: He exhibits no tenderness.  Non pitting edema to BLE  Neurological: He is alert and oriented to person, place, and time.  Skin: Skin is warm and dry.  Psychiatric: He has a normal mood and affect. His behavior is normal.  Nursing note and vitals reviewed.    ED Treatments / Results  DIAGNOSTIC STUDIES: Oxygen Saturation is 93% on RA, low by my interpretation.    COORDINATION OF CARE: 7:48 AM Discussed treatment plan  with pt at bedside and pt agreed to plan.  Labs (all labs ordered are listed, but only abnormal results are displayed) Labs Reviewed  BASIC METABOLIC PANEL - Abnormal; Notable for the following:       Result Value   Sodium 130 (*)    Chloride 94 (*)    Glucose, Bld 456 (*)    BUN 29 (*)    Creatinine, Ser 1.40 (*)    All other components within normal limits  CBG MONITORING, ED - Abnormal; Notable for the following:    Glucose-Capillary 434 (*)    All other components within normal limits  CBG MONITORING, ED - Abnormal; Notable for the following:    Glucose-Capillary 396 (*)    All other components within normal limits  CBC WITH DIFFERENTIAL/PLATELET  BRAIN NATRIURETIC PEPTIDE  I-STAT TROPOININ, ED    EKG  EKG Interpretation  Date/Time:  Saturday April 30 2016 04:13:45 EST Ventricular Rate:  107 PR Interval:    QRS Duration: 108 QT Interval:  359 QTC Calculation: 479 R Axis:   74 Text Interpretation:  Sinus tachycardia Borderline T abnormalities, inferior leads Minimal ST elevation, lateral leads Borderline prolonged QT interval No significant change since last tracing Confirmed by Lincoln Brigham [phone]) on 04/30/2016 4:22:35 AM       Radiology Dg Chest 2 View  Result Date: 04/30/2016 CLINICAL DATA:  41 year old male with cough and  shortness of breath EXAM: CHEST  2 VIEW COMPARISON:  Chest radiograph dated 01/09/2014 FINDINGS: The lungs are clear. There is no pleural effusion or pneumothorax. The cardiac silhouette is within normal limits. No acute osseous pathology identified. Partially visualized lumbar posterior fixation hardware. IMPRESSION: No active cardiopulmonary disease. Electronically Signed   By: Elgie Collard M.D.   On: 04/30/2016 01:20    Procedures Procedures (including critical care time)  Medications Ordered in ED Medications  sodium chloride 0.9 % bolus 500 mL (0 mLs Intravenous Stopped 04/30/16 0653)  sodium chloride 0.9 % bolus 500 mL (0 mLs Intravenous Stopped 04/30/16 0653)     Initial Impression / Assessment and Plan / ED Course  I have reviewed the triage vital signs and the nursing notes.  Pertinent labs & imaging results that were available during my care of the patient were reviewed by me and considered in my medical decision making (see chart for details).  Clinical Course   Patient here with shortness of breath and cough, hyperglycemia. He is in no acute distress in the emergency department with no respiratory distress. Lungs are clear bilaterally. No evidence of volume overload, CHF, pneumonia. Current clinical picture not consistent with ACS, PE. BMP with hyperglycemia, mild hyponatremia. No evidence of DKA. He was provided with IV fluid hydration with improvement of his sugar. Discussed with patient home care for hyperglycemia as well as URI with cough. In terms of hyperglycemia discussed oral fluid hydration with sugar free beverages as well as dietary modifications. In terms of cough discussed taking antitussive. Outpatient follow-up and return precautions discussed.  Final Clinical Impressions(s) / ED Diagnoses   Final diagnoses:  Viral URI with cough  Hyperglycemia    New Prescriptions Discharge Medication List as of 04/30/2016  6:43 AM    START taking these medications    Details  benzonatate (TESSALON) 100 MG capsule Take 1 capsule (100 mg total) by mouth 3 (three) times daily as needed for cough., Starting Sat 04/30/2016, Print      I personally performed the services described in this documentation, which was scribed  in my presence. The recorded information has been reviewed and is accurate.    Tilden Fossa, MD 04/30/16 [phone]

## 2016-08-24 ENCOUNTER — Ambulatory Visit (INDEPENDENT_AMBULATORY_CARE_PROVIDER_SITE_OTHER): Payer: BLUE CROSS/BLUE SHIELD | Admitting: Orthopaedic Surgery

## 2016-08-25 ENCOUNTER — Ambulatory Visit (INDEPENDENT_AMBULATORY_CARE_PROVIDER_SITE_OTHER): Payer: BC Managed Care – PPO | Admitting: Orthopaedic Surgery

## 2016-08-25 ENCOUNTER — Encounter (INDEPENDENT_AMBULATORY_CARE_PROVIDER_SITE_OTHER): Payer: Self-pay | Admitting: Orthopaedic Surgery

## 2016-08-25 ENCOUNTER — Ambulatory Visit (INDEPENDENT_AMBULATORY_CARE_PROVIDER_SITE_OTHER): Payer: BC Managed Care – PPO

## 2016-08-25 DIAGNOSIS — Z8269 Family history of other diseases of the musculoskeletal system and connective tissue: Secondary | ICD-10-CM | POA: Insufficient documentation

## 2016-08-25 DIAGNOSIS — Z96643 Presence of artificial hip joint, bilateral: Secondary | ICD-10-CM

## 2016-08-25 NOTE — Progress Notes (Signed)
The patient is 2 months out from a left total hip arthroplasty 11 months out from her right total hip arthroplasty. He is someone who is morbidly obese and is lost weight since surgery he is doing well overall. He does have a TENS unit at home and wishes to uses for his bursitis in his hips have given clearance for using this. Thus only problem that he has since surgery.  I can easily put both hips and range of motion without difficulty. She squats down easily to show me how he can do this. 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  X-rays the hip show well-seated implant with no, getting features and good positioning.  He'll continue increase his activities as comfort allows. Given his obesity I would like to see him back in 1 year for a repeat low AP pelvis.

## 2016-12-05 ENCOUNTER — Ambulatory Visit (INDEPENDENT_AMBULATORY_CARE_PROVIDER_SITE_OTHER): Payer: BC Managed Care – PPO | Admitting: Podiatry

## 2016-12-05 ENCOUNTER — Encounter: Payer: Self-pay | Admitting: Podiatry

## 2016-12-05 DIAGNOSIS — B351 Tinea unguium: Secondary | ICD-10-CM | POA: Diagnosis not present

## 2016-12-05 DIAGNOSIS — E119 Type 2 diabetes mellitus without complications: Secondary | ICD-10-CM

## 2016-12-05 DIAGNOSIS — M79676 Pain in unspecified toe(s): Secondary | ICD-10-CM

## 2016-12-05 NOTE — Progress Notes (Addendum)
Complaint:  Visit Type: Patient returns to my office for continued preventative foot care services. Complaint: Patient states" my nails have grown long and thick and become painful to walk and wear shoes" Patient has been diagnosed with DM with no foot complications. The patient presents for preventative foot care services. No changes to ROS  Podiatric Exam: Vascular: dorsalis pedis and posterior tibial pulses are palpable bilateral. Capillary return is immediate. Temperature gradient is WNL. Skin turgor WNL  Sensorium: Normal Semmes Weinstein monofilament test. Normal tactile sensation bilaterally. Nail Exam: Pt has thick disfigured discolored nails with subungual debris noted bilateral entire nail hallux through fifth toenails Ulcer Exam: There is no evidence of ulcer or pre-ulcerative changes or infection. Orthopedic Exam: Muscle tone and strength are WNL. No limitations in general ROM. No crepitus or effusions noted. Foot type and digits show no abnormalities. Bony prominences are unremarkable. Hallux limitus  B/L Skin: No Porokeratosis. No infection or ulcers.  Peeling noted lateral plantar aspect left foot.   Diagnosis:  Onychomycosis, , Pain in right toe, pain in left toes  Treatment & Plan Procedures and Treatment: Consent by patient was obtained for treatment procedures. The patient understood the discussion of treatment and procedures well. All questions were answered thoroughly reviewed. Debridement of mycotic and hypertrophic toenails, 1 through 5 bilateral and clearing of subungual debris. No ulceration, no infection noted. Told him to purchase lotrimin and apply. Prescribe indocin 50 mg.  1 qd.. Return Visit-Office Procedure: Patient instructed to return to the office for a follow up visit 3 months for continued evaluation and treatment.    Helane GuntherGregory Thaila Bottoms DPM

## 2017-01-09 ENCOUNTER — Emergency Department
Admission: EM | Admit: 2017-01-09 | Discharge: 2017-01-09 | Disposition: A | Discharge status: Home / Self Care | Payer: BC Managed Care – PPO | Attending: Emergency Medicine | Admitting: Emergency Medicine

## 2017-01-09 ENCOUNTER — Emergency Department: Payer: BC Managed Care – PPO

## 2017-01-09 DIAGNOSIS — Z87891 Personal history of nicotine dependence: Secondary | ICD-10-CM | POA: Insufficient documentation

## 2017-01-09 DIAGNOSIS — Z79899 Other long term (current) drug therapy: Secondary | ICD-10-CM | POA: Insufficient documentation

## 2017-01-09 DIAGNOSIS — Z7984 Long term (current) use of oral hypoglycemic drugs: Secondary | ICD-10-CM | POA: Diagnosis not present

## 2017-01-09 DIAGNOSIS — I10 Essential (primary) hypertension: Secondary | ICD-10-CM | POA: Insufficient documentation

## 2017-01-09 DIAGNOSIS — Z96643 Presence of artificial hip joint, bilateral: Secondary | ICD-10-CM | POA: Diagnosis not present

## 2017-01-09 DIAGNOSIS — W228XXA Striking against or struck by other objects, initial encounter: Secondary | ICD-10-CM | POA: Insufficient documentation

## 2017-01-09 DIAGNOSIS — Y929 Unspecified place or not applicable: Secondary | ICD-10-CM | POA: Diagnosis not present

## 2017-01-09 DIAGNOSIS — Z7982 Long term (current) use of aspirin: Secondary | ICD-10-CM | POA: Diagnosis not present

## 2017-01-09 DIAGNOSIS — E119 Type 2 diabetes mellitus without complications: Secondary | ICD-10-CM | POA: Diagnosis not present

## 2017-01-09 DIAGNOSIS — Y939 Activity, unspecified: Secondary | ICD-10-CM | POA: Insufficient documentation

## 2017-01-09 DIAGNOSIS — Y999 Unspecified external cause status: Secondary | ICD-10-CM | POA: Insufficient documentation

## 2017-01-09 DIAGNOSIS — S61412A Laceration without foreign body of left hand, initial encounter: Secondary | ICD-10-CM

## 2017-01-09 MED ORDER — LIDOCAINE HCL (PF) 1 % IJ SOLN
5.0000 mL | Freq: Once | INTRAMUSCULAR | Status: AC
  Administered 2017-01-09: 5 mL
  Filled 2017-01-09: qty 5
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
## 2017-01-09 NOTE — Discharge Instructions (Signed)
Keep the wound clean, dry, and covered. Follow-up with Dr. Maryellen PileEason in 7-10 days for suture removal.

## 2017-03-06 ENCOUNTER — Ambulatory Visit: Payer: BC Managed Care – PPO | Admitting: Podiatry

## 2017-04-12 IMAGING — RF DG HIP (WITH PELVIS) OPERATIVE*L*
1 series · 4 of 4 positions shown · non-contrast
Comparison: None.

CLINICAL DATA: Left hip replacement

EXAM:
OPERATIVE LEFT HIP (WITH PELVIS IF PERFORMED) two VIEWS
TECHNIQUE: Fluoroscopic spot image(s) were submitted for interpretation
post-operatively.

[Series 1: run · 4 of 4 slices shown]
[im 1/4]
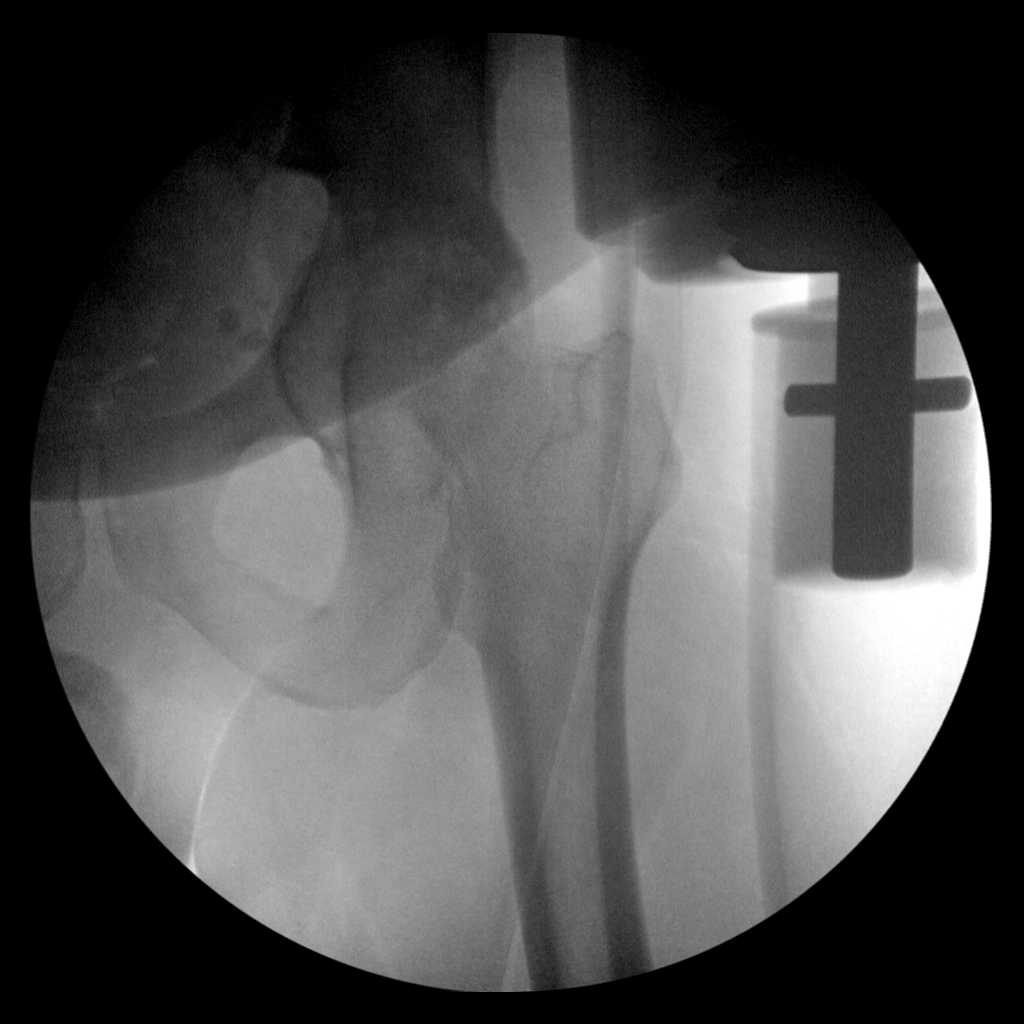
[im 2/4]
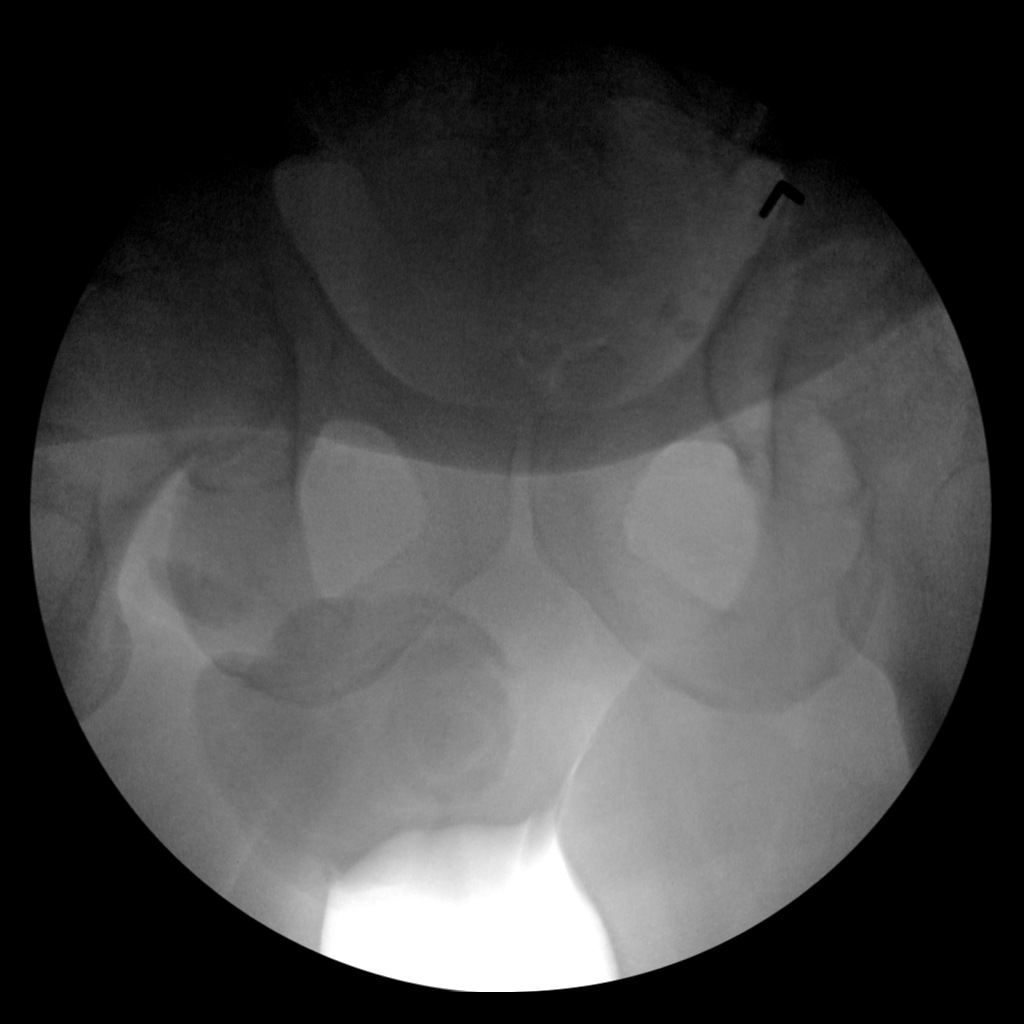
[im 3/4]
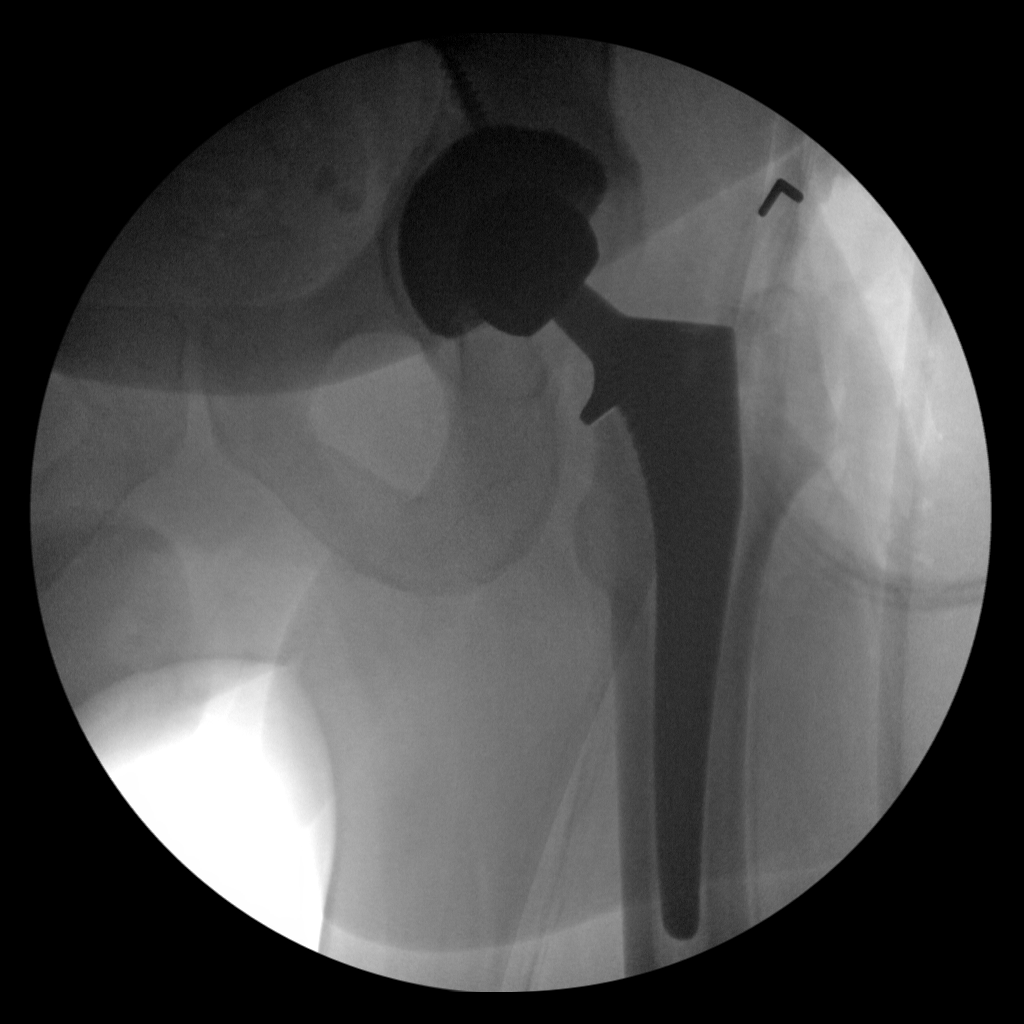
[im 4/4]
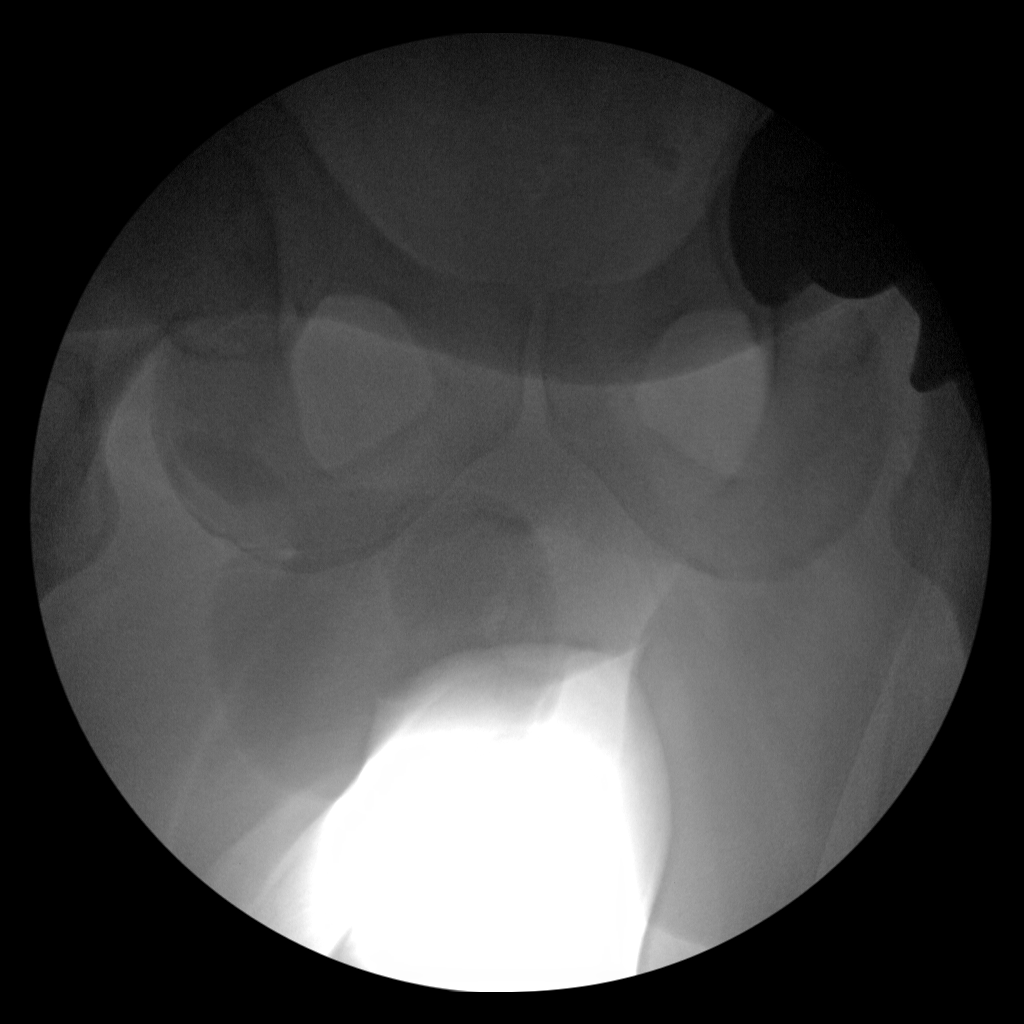

[4 of 4 positions shown; findings below may reference images not displayed]

FINDINGS: Intraoperative spot imaging demonstrates changes of left hip
replacement. Normal AP alignment. No hardware or bony complicating
feature noted.
IMPRESSION: Left hip replacement.  No visible complicating feature.

## 2017-04-12 IMAGING — CR DG HIP (WITH OR WITHOUT PELVIS) 1V PORT*L*
3 series · 3 of 3 positions shown · non-contrast
Comparison: Intraoperative films of earlier today.

CLINICAL DATA: Postop for left hip arthroplasty.

EXAM:
DG HIP (WITH OR WITHOUT PELVIS) 1V PORT LEFT

[[person_name] view]
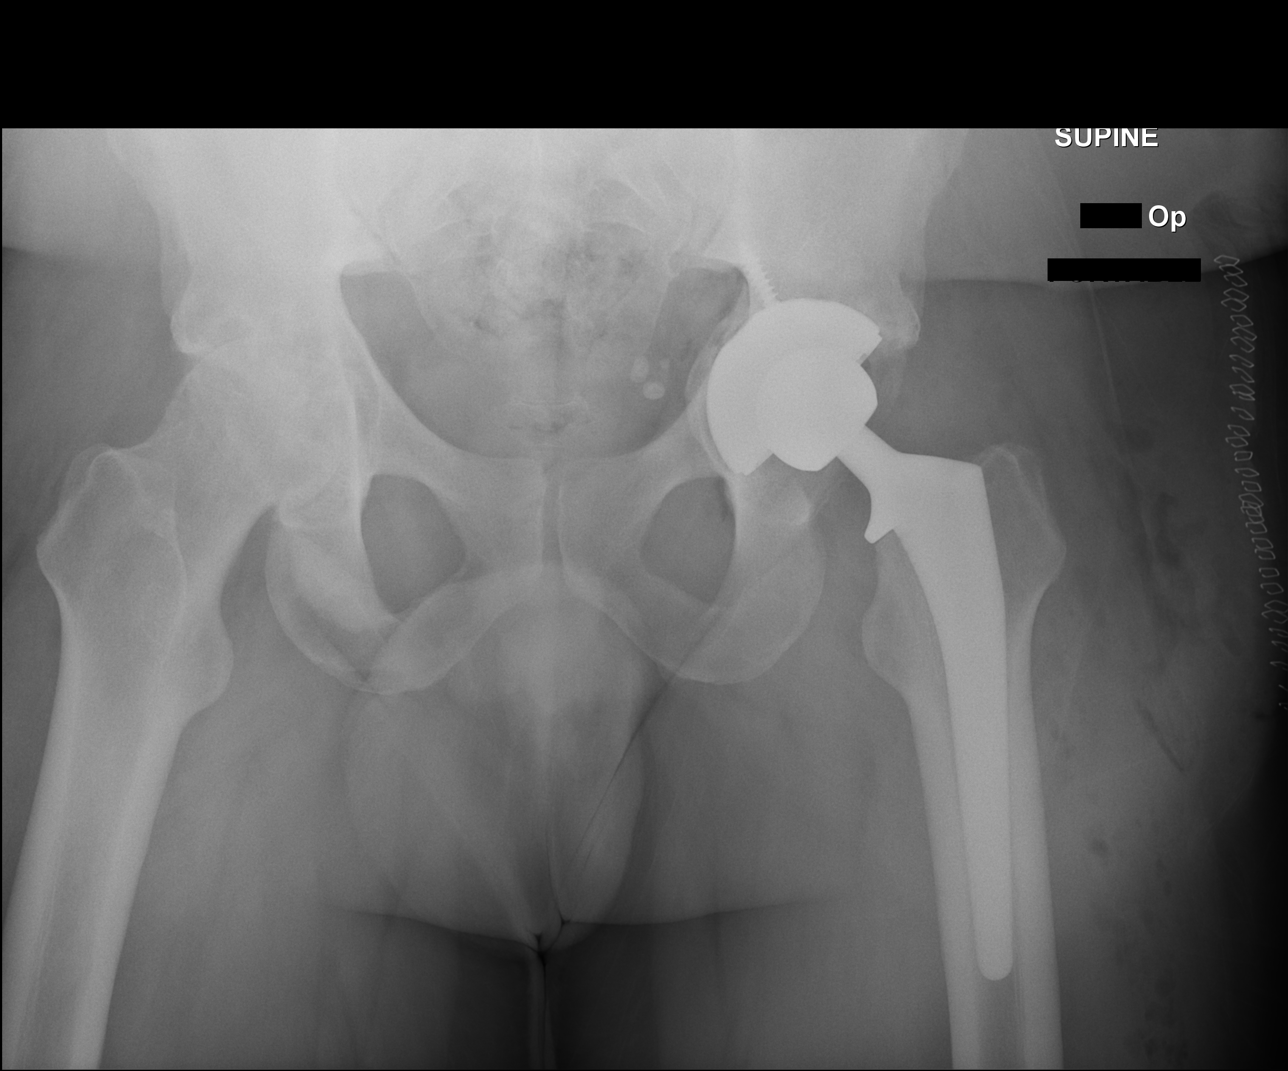

[AP]
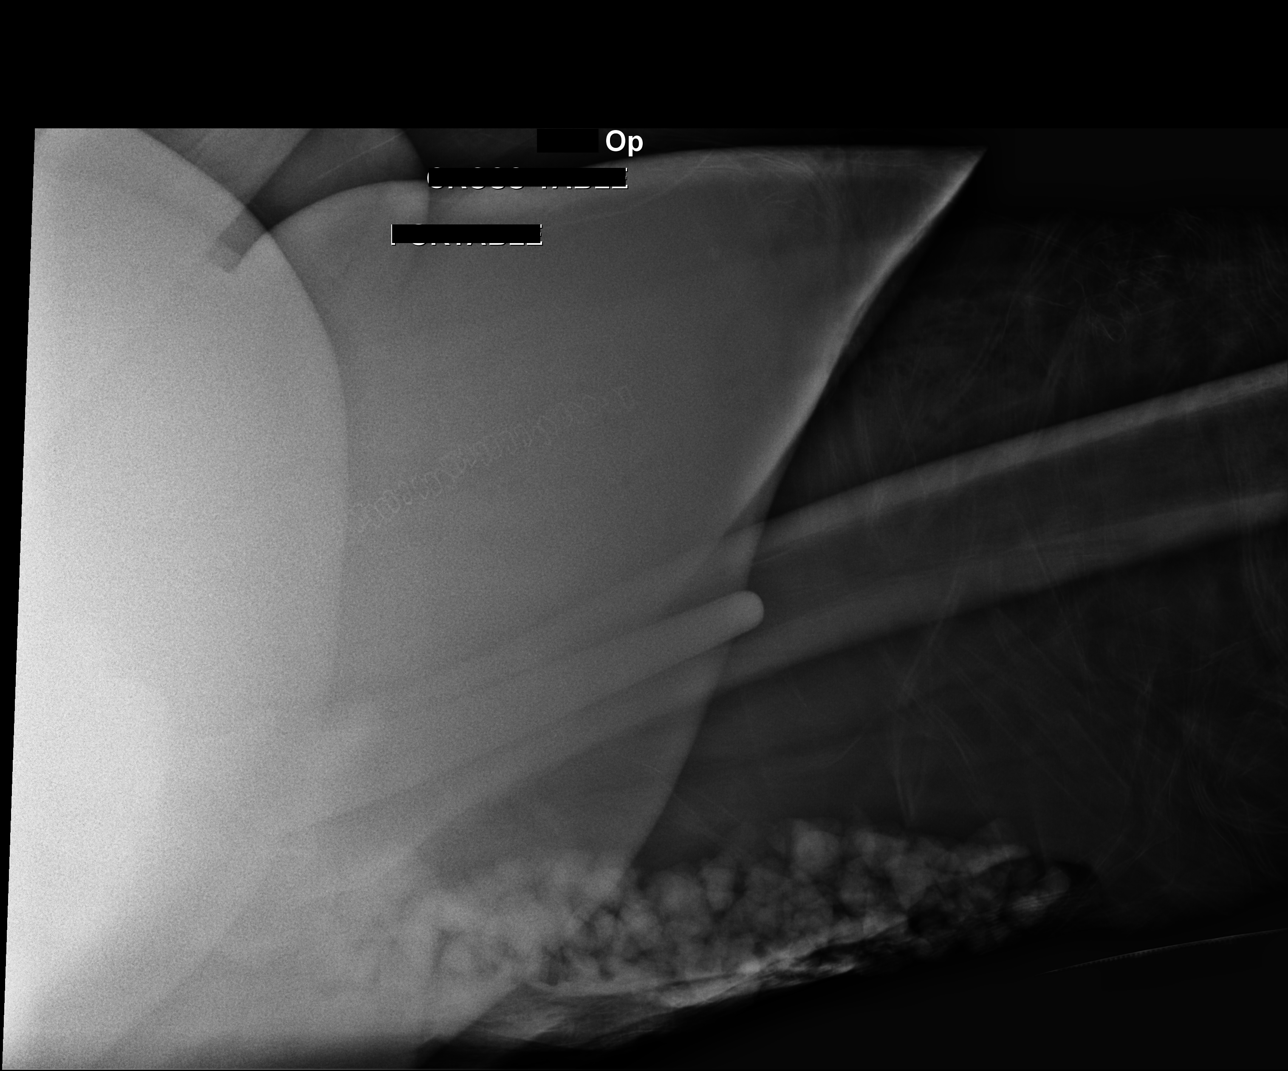

[xtable lateral]
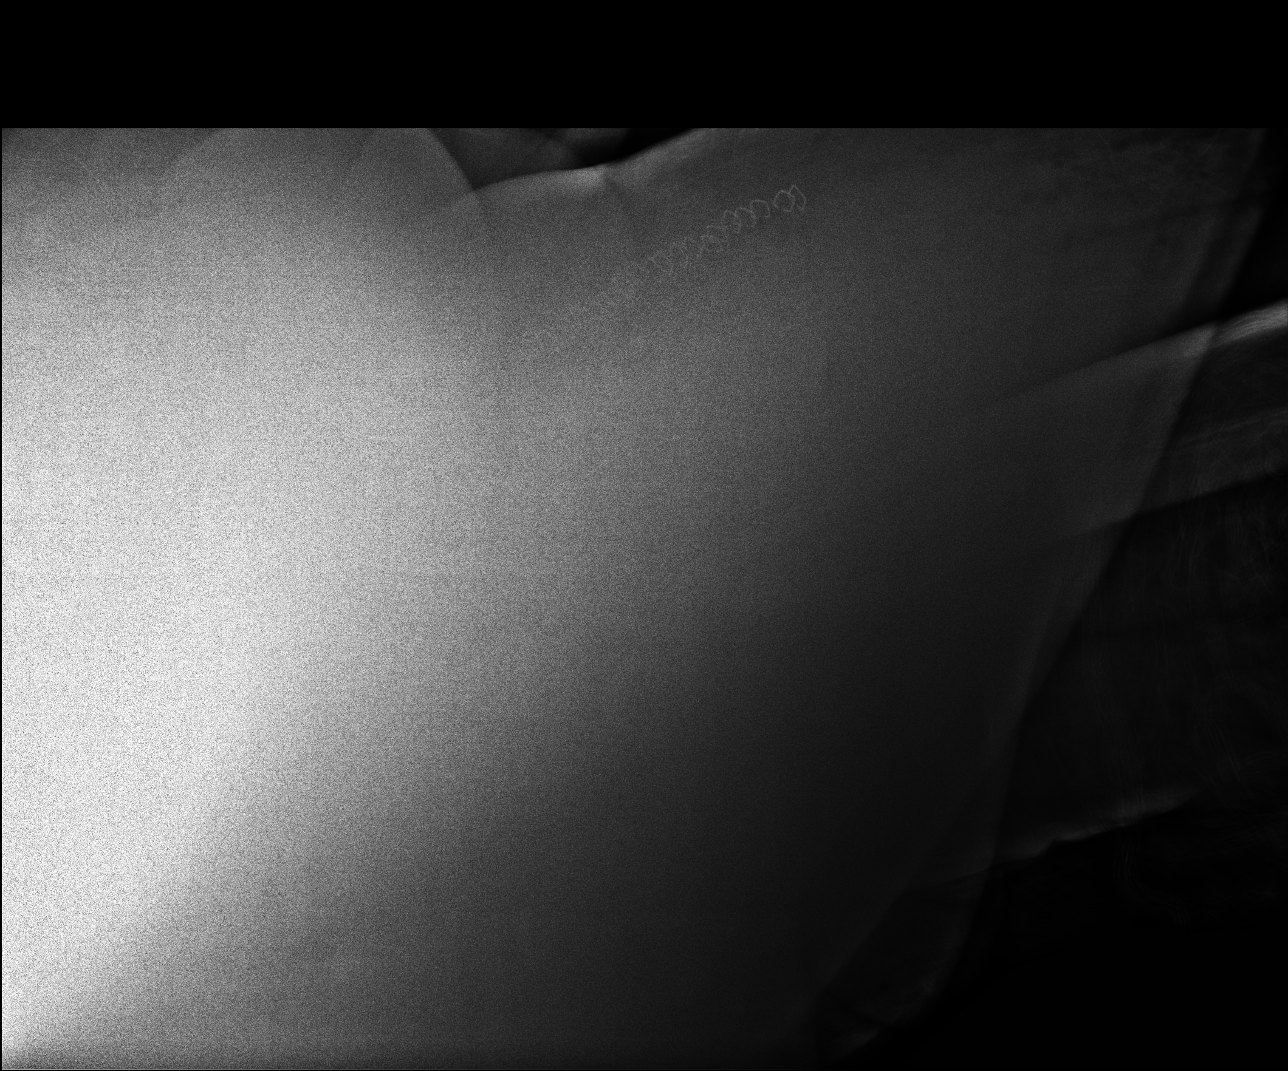

[3 of 3 positions shown; findings below may reference images not displayed]

FINDINGS: AP and lateral views. The cross-table laterals are suboptimal
secondary to patient body habitus. Given this factor, expected
appearance of left hip arthroplasty, without acute hardware
complication or periprosthetic fracture.

Moderate to severe, significantly age advanced right hip
osteoarthritis. Marked decreased joint space with subchondral cyst
formation.
IMPRESSION: Expected appearance after left hip arthroplasty.

Moderate-to-marked right hip osteoarthritis.

## 2017-08-28 ENCOUNTER — Ambulatory Visit (INDEPENDENT_AMBULATORY_CARE_PROVIDER_SITE_OTHER): Payer: BC Managed Care – PPO | Admitting: Orthopaedic Surgery

## 2017-08-30 ENCOUNTER — Ambulatory Visit (INDEPENDENT_AMBULATORY_CARE_PROVIDER_SITE_OTHER): Payer: BC Managed Care – PPO | Admitting: Orthopaedic Surgery

## 2017-08-30 ENCOUNTER — Encounter (INDEPENDENT_AMBULATORY_CARE_PROVIDER_SITE_OTHER): Payer: Self-pay | Admitting: Orthopaedic Surgery

## 2017-08-30 ENCOUNTER — Ambulatory Visit (INDEPENDENT_AMBULATORY_CARE_PROVIDER_SITE_OTHER): Payer: BC Managed Care – PPO

## 2017-08-30 DIAGNOSIS — Z96641 Presence of right artificial hip joint: Secondary | ICD-10-CM | POA: Diagnosis not present

## 2017-08-30 DIAGNOSIS — Z96642 Presence of left artificial hip joint: Secondary | ICD-10-CM | POA: Diagnosis not present

## 2017-08-30 MED ORDER — DICLOFENAC SODIUM 75 MG PO TBEC: 75.0000 mg | DELAYED_RELEASE_TABLET | Freq: Two times a day (BID) | ORAL | 3 refills | Status: DC | PRN

## 2017-08-30 MED ORDER — IBUPROFEN 800 MG PO TABS: 800.0000 mg | ORAL_TABLET | Freq: Three times a day (TID) | ORAL | 3 refills | Status: DC | PRN

## 2017-08-30 NOTE — Progress Notes (Signed)
Office Visit Note   Patient: [name]           Date of Birth: 12/19/1975           MRN: [id number] Visit Date: 08/30/2017              Requested by: Derwood KaplanEason, Ernest B, MD [address] [place], [place] [address] PCP: Derwood KaplanEason, Ernest B, MD   Assessment & Plan: Visit Diagnoses:  1. History of right hip replacement   2. Status post total replacement of left hip   3. Status post total replacement of right hip     Plan: At this point since we have been seeing him for 2 years now status post both hip replacements and due to the fact he is doing well we will have him follow-up as needed for his hips.  Obviously if he is having any issues at all in any time he should let us know because given his young age and his body mass index he can have problems in the future with wear on the joints.  We talked about this in length.  If there is any issues at all he will let us know.  Follow-Up Instructions: Return if symptoms worsen or fail to improve.   Orders:  Orders Placed This Encounter  Procedures  . XR Pelvis 1-2 Views   Meds ordered this encounter  Medications  . ibuprofen (ADVIL,MOTRIN) 800 MG tablet    Sig: Take 1 tablet (800 mg total) by mouth every 8 (eight) hours as needed.    Dispense:  90 tablet    Refill:  3  . diclofenac (VOLTAREN) 75 MG EC tablet    Sig: Take 1 tablet (75 mg total) by mouth 2 (two) times daily between meals as needed.    Dispense:  60 tablet    Refill:  3      Procedures: No procedures performed   Clinical Data: No additional findings.   Subjective: Chief Complaint  Patient presents with  . Right Hip - Follow-up  The patient is a very pleasant 42 year old gentleman well-known to me.  He has a history of bilateral hip replacements that were just done a few months apart in 2017.  He is now about 2 years out from the surgeries now.  He walks without a significant limp and does not use an assistive device.  He does report occasional popping in his  hips but they are not painful to him at all.  He is working to try to lose weight.  His last recorded BMI was 45.6.  He reports good satisfaction with both his hip replacements.  HPI  Review of Systems Currently denies any headache, chest pain short of breath, fever, chills, nausea, vomiting.  Objective: Vital Signs: There were no vitals taken for this visit.  Physical Exam Is alert and oriented x3 and in no acute distress Ortho Exam  Specialty Comments:  No specialty comments available.  Imaging: Xr Pelvis 1-2 Views  Result Date: 08/30/2017 An AP pelvis shows bilateral total hip arthroplasties with no complicating features.  These have been compared to previous films one year ago.    PMFS History: Patient Active Problem List   Diagnosis Date Noted  . Family history of bilateral hip replacements 08/25/2016  . Status post total replacement of right hip 09/25/2015  . Osteoarthritis of right hip 07/24/2015  . Morbid obesity (HCC) 07/24/2015  . Osteoarthritis of left hip 07/21/2015  . Status post total replacement of left  hip 07/21/2015  . Lumbar stenosis with neurogenic claudication 01/14/2014  . Spinal stenosis, lumbar region, with neurogenic claudication 07/30/2012   Past Medical History:  Diagnosis Date  . Anemia   . Anginal pain (HCC) several yrs ago  . Arthritis   . Asthma    as a child  . Diabetes mellitus without complication (HCC)   . H/O transfusion of packed red blood cells    with prior lumbar fusions x2  . Hypertension   . Sleep apnea    wore CPAP prior to tonsillectomy    History reviewed. No pertinent family history.  Past Surgical History:  Procedure Laterality Date  . BACK SURGERY  march 2014 and sept 2015   lumbar fusion mrach L 3 to L 4 to L 5, sept L 2 to L 3  . TONSILLECTOMY  2005 march   for sleep apnea  . TOTAL HIP ARTHROPLASTY Left 07/21/2015   Procedure: LEFT TOTAL HIP ARTHROPLASTY ANTERIOR APPROACH;  Surgeon: Kathryne Hitch, MD;   Location: [facility];  Service: Orthopedics;  Laterality: Left;  . TOTAL HIP ARTHROPLASTY Right 09/25/2015   Procedure: RIGHT TOTAL HIP ARTHROPLASTY ANTERIOR APPROACH;  Surgeon: Kathryne Hitch, MD;  Location: [facility];  Service: Orthopedics;  Laterality: Right;  . WISDOM TOOTH EXTRACTION     Social History   Occupational History  . Not on file  Tobacco Use  . Smoking status: Former Smoker    Packs/day: 0.50    Years: 20.00    Pack years: 10.00    Types: Cigarettes    Last attempt to quit: 07/09/2012    Years since quitting: 5.1  . Smokeless tobacco: Never Used  Substance and Sexual Activity  . Alcohol use: No  . Drug use: No  . Sexual activity: Not on file

## 2017-09-18 DIAGNOSIS — E119 Type 2 diabetes mellitus without complications: Secondary | ICD-10-CM | POA: Insufficient documentation

## 2018-01-29 ENCOUNTER — Ambulatory Visit (INDEPENDENT_AMBULATORY_CARE_PROVIDER_SITE_OTHER): Payer: BC Managed Care – PPO

## 2018-01-29 ENCOUNTER — Encounter (INDEPENDENT_AMBULATORY_CARE_PROVIDER_SITE_OTHER): Payer: Self-pay | Admitting: Orthopaedic Surgery

## 2018-01-29 ENCOUNTER — Telehealth (INDEPENDENT_AMBULATORY_CARE_PROVIDER_SITE_OTHER): Payer: Self-pay | Admitting: Radiology

## 2018-01-29 ENCOUNTER — Ambulatory Visit (INDEPENDENT_AMBULATORY_CARE_PROVIDER_SITE_OTHER): Payer: BC Managed Care – PPO | Admitting: Orthopaedic Surgery

## 2018-01-29 DIAGNOSIS — M1712 Unilateral primary osteoarthritis, left knee: Secondary | ICD-10-CM | POA: Diagnosis not present

## 2018-01-29 DIAGNOSIS — M25562 Pain in left knee: Secondary | ICD-10-CM | POA: Diagnosis not present

## 2018-01-29 MED ORDER — METHYLPREDNISOLONE ACETATE 40 MG/ML IJ SUSP
40.0000 mg | INTRAMUSCULAR | Status: AC | PRN
  Administered 2018-01-29: 40 mg via INTRA_ARTICULAR

## 2018-01-29 MED ORDER — LIDOCAINE HCL 1 % IJ SOLN
3.0000 mL | INTRAMUSCULAR | Status: AC | PRN
  Administered 2018-01-29: 3 mL

## 2018-01-29 MED ORDER — INDOMETHACIN 50 MG PO CAPS: 50.0000 mg | ORAL_CAPSULE | Freq: Two times a day (BID) | ORAL | 3 refills | Status: DC | PRN

## 2018-01-29 NOTE — Telephone Encounter (Signed)
Noted  

## 2018-01-29 NOTE — Telephone Encounter (Signed)
Needs approval for synvisc one injection of left knee - Dr. Magnus IvanBlackman

## 2018-01-29 NOTE — Addendum Note (Signed)
Addended by: Doneen PoissonBLACKMAN, Leimomi Zervas on: 01/29/2018 11:53 AM   Modules accepted: Orders

## 2018-01-29 NOTE — Progress Notes (Signed)
Office Visit Note   Patient: Nicholas RearDarius D Duman           Date of Birth: 02/19/1976           MRN: [id number] Visit Date: 01/29/2018              Requested by: Derwood KaplanEason, Ernest B, MD [address] [place], [place] [address] PCP: Derwood KaplanEason, Ernest B, MD   Assessment & Plan: Visit Diagnoses:  1. Acute pain of left knee   2. Unilateral primary osteoarthritis, left knee     Plan: Since this is an acute event to his knee I do feel he would benefit from a steroid injection in his left knee as to see.  The risks and benefits of steroid injections were explained in detail he did wish to proceed with that and tolerated well.  Given his mild to moderate arthritis I do recommend a hyaluronic acid injection with Synvisc 1 in his knee.  We will order that for a month from now.  Right now his right knee is asymptomatic.  Follow-Up Instructions: Return in about 4 weeks (around 02/26/2018).   Orders:  Orders Placed This Encounter  Procedures  . Large Joint Inj  . XR KNEE 3 VIEW LEFT   No orders of the defined types were placed in this encounter.     Procedures: Large Joint Inj: L knee on 01/29/2018 11:27 AM Indications: diagnostic evaluation and pain Details: 22 G 1.5 in needle, superolateral approach  Arthrogram: No  Medications: 3 mL lidocaine 1 %; 40 mg methylPREDNISolone acetate 40 MG/ML Outcome: tolerated well, no immediate complications Procedure, treatment alternatives, risks and benefits explained, specific risks discussed. Consent was given by the patient. Immediately prior to procedure a time out was called to verify the correct patient, procedure, equipment, support staff and site/side marked as required. Patient was prepped and draped in the usual sterile fashion.       Clinical Data: No additional findings.   Subjective: Chief Complaint  Patient presents with  . Left Knee - Pain  The patient is well-known to me.  He has had an acute left knee injury about a month or so ago  when he is come down some steps he missed the last step landing hard on his left knee with a twisting type injury.  He points the medial joint space source of his pain.  His knee did swell up and icing and elevation and activity modification did help but he still having pain along the medial aspect of his knee especially when he extends his knee.  He denies any locking catching.  He has not injured this knee before.  He is someone that does have a body mass index of almost 46.  We have replaced his hips before.  He denies any right knee pain.  His only his left knee that is been hurting and it has been swelling some.  HPI  Review of Systems He currently denies any headache, chest pain, shortness of breath, fever, chills, nausea, vomiting.  Objective: Vital Signs: There were no vitals taken for this visit.  Physical Exam Is alert and oriented x3 and in no acute distress Ortho Exam Examination of his left knee does show a mild effusion.  He has medial joint line tenderness slight varus malalignment.  His range of motion is full but very painful when I come from flexion to dropping him down extension quickly causes pain.  His McMurray's and Lachman's exams that were negative. Specialty Comments:  No specialty comments available.  Imaging: Xr Knee 3 View Left  Result Date: 01/29/2018 3 views of the left knee show no acute findings.  There is slight varus malalignment and medial joint space narrowing.    PMFS History: Patient Active Problem List   Diagnosis Date Noted  . Family history of bilateral hip replacements 08/25/2016  . Status post total replacement of right hip 09/25/2015  . Osteoarthritis of right hip 07/24/2015  . Morbid obesity (HCC) 07/24/2015  . Osteoarthritis of left hip 07/21/2015  . Status post total replacement of left hip 07/21/2015  . Lumbar stenosis with neurogenic claudication 01/14/2014  . Spinal stenosis, lumbar region, with neurogenic claudication 07/30/2012    Past Medical History:  Diagnosis Date  . Anemia   . Anginal pain (HCC) several yrs ago  . Arthritis   . Asthma    as a child  . Diabetes mellitus without complication (HCC)   . H/O transfusion of packed red blood cells    with prior lumbar fusions x2  . Hypertension   . Sleep apnea    wore CPAP prior to tonsillectomy    No family history on file.  Past Surgical History:  Procedure Laterality Date  . BACK SURGERY  march 2014 and sept 2015   lumbar fusion mrach L 3 to L 4 to L 5, sept L 2 to L 3  . TONSILLECTOMY  2005 march   for sleep apnea  . TOTAL HIP ARTHROPLASTY Left 07/21/2015   Procedure: LEFT TOTAL HIP ARTHROPLASTY ANTERIOR APPROACH;  Surgeon: Kathryne Hitch, MD;  Location: [facility];  Service: Orthopedics;  Laterality: Left;  . TOTAL HIP ARTHROPLASTY Right 09/25/2015   Procedure: RIGHT TOTAL HIP ARTHROPLASTY ANTERIOR APPROACH;  Surgeon: Kathryne Hitch, MD;  Location: [facility];  Service: Orthopedics;  Laterality: Right;  . WISDOM TOOTH EXTRACTION     Social History   Occupational History  . Not on file  Tobacco Use  . Smoking status: Former Smoker    Packs/day: 0.50    Years: 20.00    Pack years: 10.00    Types: Cigarettes    Last attempt to quit: 07/09/2012    Years since quitting: 5.5  . Smokeless tobacco: Never Used  Substance and Sexual Activity  . Alcohol use: No  . Drug use: No  . Sexual activity: Not on file

## 2018-02-07 ENCOUNTER — Telehealth (INDEPENDENT_AMBULATORY_CARE_PROVIDER_SITE_OTHER): Payer: Self-pay

## 2018-02-07 NOTE — Telephone Encounter (Signed)
Submitted VOB for SynviscOne, left knee. 

## 2018-02-09 ENCOUNTER — Other Ambulatory Visit (INDEPENDENT_AMBULATORY_CARE_PROVIDER_SITE_OTHER): Payer: Self-pay | Admitting: Orthopaedic Surgery

## 2018-02-15 ENCOUNTER — Telehealth (INDEPENDENT_AMBULATORY_CARE_PROVIDER_SITE_OTHER): Payer: Self-pay

## 2018-02-15 NOTE — Telephone Encounter (Signed)
PA required for SynviscOne, left knee. Completed PA form faxed to [facility] at [phone].

## 2018-02-21 ENCOUNTER — Telehealth (INDEPENDENT_AMBULATORY_CARE_PROVIDER_SITE_OTHER): Payer: Self-pay

## 2018-02-21 NOTE — Telephone Encounter (Signed)
Patient is approved for SynviscOne, left knee. Buy & Bill Covered at 100% after Co-pay $160.00 Co-pay PA required PA Approval# [id number] Valid 02/15/2018- 02/15/2019  Appt.02/26/2018

## 2018-02-26 ENCOUNTER — Encounter (INDEPENDENT_AMBULATORY_CARE_PROVIDER_SITE_OTHER): Payer: Self-pay | Admitting: Orthopaedic Surgery

## 2018-02-26 ENCOUNTER — Ambulatory Visit (INDEPENDENT_AMBULATORY_CARE_PROVIDER_SITE_OTHER): Payer: BC Managed Care – PPO | Admitting: Orthopaedic Surgery

## 2018-02-26 DIAGNOSIS — M1712 Unilateral primary osteoarthritis, left knee: Secondary | ICD-10-CM | POA: Diagnosis not present

## 2018-02-26 MED ORDER — LIDOCAINE HCL 1 % IJ SOLN
0.5000 mL | INTRAMUSCULAR | Status: AC | PRN
  Administered 2018-02-26: .5 mL

## 2018-02-26 MED ORDER — HYLAN G-F 20 48 MG/6ML IX SOSY
48.0000 mg | PREFILLED_SYRINGE | INTRA_ARTICULAR | Status: AC | PRN
  Administered 2018-02-26: 48 mg via INTRA_ARTICULAR

## 2018-02-26 NOTE — Progress Notes (Signed)
   Procedure Note  Patient: [name]             Date of Birth: 02/11/1976           MRN: [id number]             Visit Date: 02/26/2018 HPI: Mr. Kuechle comes in today for left knee Synvisc 1 injection.  He has known osteoarthritis of his left knee.  He states he feels tight today and he is having significant pain in it.  He notes swelling of the knee.  Physical exam: Left knee full extension flexion to about 105 to 110 degrees.  Positive effusion no abnormal warmth erythema. Procedures: Visit Diagnoses: No diagnosis found.  Large Joint Inj: L knee on 02/26/2018 11:39 AM Indications: pain Details: 22 G 1.5 in needle, anterolateral approach  Arthrogram: No  Medications: 0.5 mL lidocaine 1 %; 48 mg Hylan 48 MG/6ML Aspirate: 45 mL yellow and blood-tinged Outcome: tolerated well, no immediate complications Procedure, treatment alternatives, risks and benefits explained, specific risks discussed. Consent was given by the patient. Immediately prior to procedure a time out was called to verify the correct patient, procedure, equipment, support staff and site/side marked as required. Patient was prepped and draped in the usual sterile fashion.    Plan: He understands that if symptoms 1 injections no more often than every 6 months cortisone injections no more often than every 3 months.  He will follow-up on an as-needed basis.  Questions encouraged and answered

## 2018-06-03 ENCOUNTER — Other Ambulatory Visit (INDEPENDENT_AMBULATORY_CARE_PROVIDER_SITE_OTHER): Payer: Self-pay | Admitting: Orthopaedic Surgery

## 2018-06-04 ENCOUNTER — Other Ambulatory Visit (INDEPENDENT_AMBULATORY_CARE_PROVIDER_SITE_OTHER): Payer: Self-pay | Admitting: Orthopaedic Surgery

## 2018-06-04 NOTE — Telephone Encounter (Signed)
Please advise 

## 2018-07-20 ENCOUNTER — Other Ambulatory Visit (INDEPENDENT_AMBULATORY_CARE_PROVIDER_SITE_OTHER): Payer: Self-pay | Admitting: Orthopaedic Surgery

## 2018-09-13 ENCOUNTER — Other Ambulatory Visit (INDEPENDENT_AMBULATORY_CARE_PROVIDER_SITE_OTHER): Payer: Self-pay | Admitting: Orthopaedic Surgery

## 2018-09-26 ENCOUNTER — Other Ambulatory Visit: Payer: Self-pay

## 2018-09-26 ENCOUNTER — Encounter: Payer: Self-pay | Admitting: Podiatry

## 2018-09-26 ENCOUNTER — Ambulatory Visit: Payer: BC Managed Care – PPO | Admitting: Podiatry

## 2018-09-26 VITALS — Temp 97.2°F

## 2018-09-26 DIAGNOSIS — E119 Type 2 diabetes mellitus without complications: Secondary | ICD-10-CM | POA: Diagnosis not present

## 2018-09-26 DIAGNOSIS — B351 Tinea unguium: Secondary | ICD-10-CM

## 2018-09-26 DIAGNOSIS — B353 Tinea pedis: Secondary | ICD-10-CM

## 2018-09-26 DIAGNOSIS — M79676 Pain in unspecified toe(s): Secondary | ICD-10-CM | POA: Diagnosis not present

## 2018-09-26 DIAGNOSIS — L600 Ingrowing nail: Secondary | ICD-10-CM

## 2018-09-26 MED ORDER — TERBINAFINE HCL 250 MG PO TABS: 250.0000 mg | ORAL_TABLET | Freq: Every day | ORAL | 0 refills | Status: DC

## 2018-09-26 NOTE — Progress Notes (Signed)
He presents today chief complaint of painful ingrown toenails been red and swollen for the past few weeks as he refers to the fibular border of the hallux left.  He is also concerned about itchy dry peeling feet hemoglobin A1c is 9.0.  Objective: Pulses are strongly palpable no open lesions or wounds are noted between the toes on the plantar aspect of the foot.  Sharp incurvated nail margin which is sharply debrided today without complications.  No need for matrixectomy's.  He does have dry scaly skin which appears to be fungus and he also has a redevelopment of fungus distally from where we had treated him previously with Lamisil had healed 100% and now starting to regress.  Assessment: Onychomycosis ingrown toenail tinea pedis.  Plan: Debrided toenails for him today and I also started him on Lamisil.  We will follow-up with him in 2 months

## 2018-10-16 ENCOUNTER — Other Ambulatory Visit (INDEPENDENT_AMBULATORY_CARE_PROVIDER_SITE_OTHER): Payer: Self-pay | Admitting: Orthopaedic Surgery

## 2018-10-29 ENCOUNTER — Other Ambulatory Visit (INDEPENDENT_AMBULATORY_CARE_PROVIDER_SITE_OTHER): Payer: Self-pay | Admitting: Orthopaedic Surgery

## 2018-11-26 ENCOUNTER — Encounter: Payer: Self-pay | Admitting: Podiatry

## 2018-11-26 ENCOUNTER — Other Ambulatory Visit: Payer: Self-pay

## 2018-11-26 ENCOUNTER — Ambulatory Visit (INDEPENDENT_AMBULATORY_CARE_PROVIDER_SITE_OTHER): Payer: BC Managed Care – PPO | Admitting: Podiatry

## 2018-11-26 DIAGNOSIS — M79676 Pain in unspecified toe(s): Secondary | ICD-10-CM

## 2018-11-26 DIAGNOSIS — B351 Tinea unguium: Secondary | ICD-10-CM

## 2018-11-26 DIAGNOSIS — L603 Nail dystrophy: Secondary | ICD-10-CM | POA: Diagnosis not present

## 2018-11-26 DIAGNOSIS — E119 Type 2 diabetes mellitus without complications: Secondary | ICD-10-CM | POA: Diagnosis not present

## 2018-11-26 DIAGNOSIS — B353 Tinea pedis: Secondary | ICD-10-CM

## 2018-11-26 MED ORDER — TERBINAFINE HCL 250 MG PO TABS: 250.0000 mg | ORAL_TABLET | Freq: Every day | ORAL | 0 refills | Status: DC

## 2018-11-26 MED ORDER — NAFTIFINE HCL 1 % EX CREA: TOPICAL_CREAM | Freq: Every day | CUTANEOUS | 1 refills | Status: DC

## 2018-11-27 ENCOUNTER — Encounter: Payer: Self-pay | Admitting: Podiatry

## 2018-11-27 ENCOUNTER — Other Ambulatory Visit (INDEPENDENT_AMBULATORY_CARE_PROVIDER_SITE_OTHER): Payer: Self-pay | Admitting: Orthopaedic Surgery

## 2018-11-27 NOTE — Progress Notes (Signed)
He presents today for follow-up of his nail fungus he is completed 1 month of Lamisil states that the skin is clearing up but he would like for the nails to look a little bit better.  He denies any change in his past medical history medications and allergies denies any problems taking the medication.  Objective: Vital signs are stable he is alert and oriented x3.  Nails are long thick yellow dystrophic-like mycotic tinea pedis is resolving.  Assessment: Resolving tinea pedis onychomycosis.  Plan: Debrided toenails 1 through 5 for him today I also provided him with another month's worth of Lamisil I like to follow-up with him in 2 months.

## 2018-11-27 NOTE — Telephone Encounter (Signed)
Ok to rf? 

## 2019-01-02 ENCOUNTER — Other Ambulatory Visit (INDEPENDENT_AMBULATORY_CARE_PROVIDER_SITE_OTHER): Payer: Self-pay | Admitting: Orthopaedic Surgery

## 2019-01-23 ENCOUNTER — Ambulatory Visit (INDEPENDENT_AMBULATORY_CARE_PROVIDER_SITE_OTHER): Payer: BC Managed Care – PPO | Admitting: Podiatry

## 2019-01-23 ENCOUNTER — Other Ambulatory Visit: Payer: Self-pay

## 2019-01-23 ENCOUNTER — Encounter: Payer: Self-pay | Admitting: Podiatry

## 2019-01-23 DIAGNOSIS — B353 Tinea pedis: Secondary | ICD-10-CM

## 2019-01-23 DIAGNOSIS — L603 Nail dystrophy: Secondary | ICD-10-CM

## 2019-01-23 MED ORDER — TERBINAFINE HCL 250 MG PO TABS: 250.0000 mg | ORAL_TABLET | Freq: Every day | ORAL | 0 refills | Status: DC

## 2019-01-23 NOTE — Progress Notes (Signed)
He presents today for follow-up of his onychomycosis states that he has been taking the Lamisil regularly.  Has completed 60 days worth of Lamisil.  Denies any fever chills nausea vomiting muscle aches and pains.  States that he continues to have a rash on the medial aspect of the foot as well as on his fingers.  Objective: Vital signs are stable he is alert oriented x3 tinea appears to have resolved but he remains on his feet appears to be a some type of an eczematous dermatitis or something like that.  Similarly on the hands particularly as he discusses the pustules.  However his toenails appear to be lightening but they are not growing out as fast as I would like to see the onychomycosis grew out.  Assessment: Onychomycosis long-term therapy.  Plan: Discussed etiology pathology and surgical therapies also discussed possibility for new orthotics going to refer him to Dr. Darrick Huntsman for dermatology and also going to refill the Lamisil 250 mg tablets 1 p.o. q. OD and I will follow-up with him in 3 months.  We will schedule him with Liliane Channel for orthotics.  Pes planus posterior tibial tendinitis.

## 2019-02-12 LAB — HEPATIC FUNCTION PANEL
ALT: 54 IU/L — ABNORMAL HIGH (ref 0–44)
AST: 34 IU/L (ref 0–40)
Albumin: 4.4 g/dL (ref 4.0–5.0)
Alkaline Phosphatase: 103 IU/L (ref 39–117)
Bilirubin Total: 0.3 mg/dL (ref 0.0–1.2)
Bilirubin, Direct: 0.1 mg/dL (ref 0.00–0.40)
Total Protein: 7.2 g/dL (ref 6.0–8.5)

## 2019-02-18 ENCOUNTER — Other Ambulatory Visit (INDEPENDENT_AMBULATORY_CARE_PROVIDER_SITE_OTHER): Payer: Self-pay | Admitting: Orthopaedic Surgery

## 2019-02-19 ENCOUNTER — Other Ambulatory Visit: Payer: Self-pay

## 2019-02-19 MED ORDER — DICLOFENAC SODIUM 75 MG PO TBEC: DELAYED_RELEASE_TABLET | ORAL | 2 refills | Status: DC

## 2019-02-27 ENCOUNTER — Other Ambulatory Visit: Payer: BC Managed Care – PPO | Admitting: Orthotics

## 2019-03-27 ENCOUNTER — Other Ambulatory Visit: Payer: Self-pay | Admitting: Orthotics

## 2019-04-06 ENCOUNTER — Other Ambulatory Visit (INDEPENDENT_AMBULATORY_CARE_PROVIDER_SITE_OTHER): Payer: Self-pay | Admitting: Orthopaedic Surgery

## 2019-04-24 ENCOUNTER — Ambulatory Visit (INDEPENDENT_AMBULATORY_CARE_PROVIDER_SITE_OTHER): Payer: Self-pay | Admitting: Podiatry

## 2019-04-24 ENCOUNTER — Encounter: Payer: Self-pay | Admitting: Podiatry

## 2019-04-24 ENCOUNTER — Other Ambulatory Visit: Payer: Self-pay

## 2019-04-24 DIAGNOSIS — E119 Type 2 diabetes mellitus without complications: Secondary | ICD-10-CM

## 2019-04-24 DIAGNOSIS — M79676 Pain in unspecified toe(s): Secondary | ICD-10-CM

## 2019-04-24 DIAGNOSIS — B351 Tinea unguium: Secondary | ICD-10-CM

## 2019-04-24 MED ORDER — TERBINAFINE HCL 250 MG PO TABS: 250.0000 mg | ORAL_TABLET | Freq: Every day | ORAL | 0 refills | Status: DC

## 2019-04-24 NOTE — Patient Instructions (Signed)
Dr. Hyatt has sent over a refill for Lamisil to your pharmacy today. The instructions on your bottle will say "take 1 tablet daily", however, he would like for you to take one pill every other day. He will follow up with you in 3 months to re-evaluate your toenails. 

## 2019-04-24 NOTE — Progress Notes (Signed)
He presents today for follow-up of his nail fungus.  States he has been taking his Lamisil for 120 days he says is doing good he is like to have his nails cut.  Denies any problems taking the medication.  Objective: Signs are stable alert and oriented x3.  Pulses are palpable.  There is no erythema edema cellulitis drainage odor toenails are long thick yellow dystrophic-like mycotic but resolving.  Assessment: Resolving onychomycosis pain in limb.  Plan:debridment of nails 1-5 bilaterally and fu in three months.

## 2019-05-15 ENCOUNTER — Other Ambulatory Visit: Payer: Self-pay | Admitting: Orthotics

## 2019-05-24 ENCOUNTER — Other Ambulatory Visit: Payer: Self-pay

## 2019-05-24 ENCOUNTER — Inpatient Hospital Stay
Admission: EM | Admit: 2019-05-24 | Discharge: 2019-05-30 | DRG: 177 | Disposition: A | Discharge status: Home Health | Payer: HRSA Program | Attending: Internal Medicine | Admitting: Internal Medicine

## 2019-05-24 ENCOUNTER — Emergency Department: Payer: HRSA Program

## 2019-05-24 ENCOUNTER — Encounter: Payer: Self-pay | Admitting: Intensive Care

## 2019-05-24 DIAGNOSIS — J45909 Unspecified asthma, uncomplicated: Secondary | ICD-10-CM | POA: Diagnosis present

## 2019-05-24 DIAGNOSIS — Z96643 Presence of artificial hip joint, bilateral: Secondary | ICD-10-CM | POA: Diagnosis present

## 2019-05-24 DIAGNOSIS — E1165 Type 2 diabetes mellitus with hyperglycemia: Secondary | ICD-10-CM | POA: Diagnosis present

## 2019-05-24 DIAGNOSIS — I10 Essential (primary) hypertension: Secondary | ICD-10-CM | POA: Diagnosis present

## 2019-05-24 DIAGNOSIS — Z91030 Bee allergy status: Secondary | ICD-10-CM

## 2019-05-24 DIAGNOSIS — M545 Low back pain: Secondary | ICD-10-CM | POA: Diagnosis present

## 2019-05-24 DIAGNOSIS — M48062 Spinal stenosis, lumbar region with neurogenic claudication: Secondary | ICD-10-CM | POA: Diagnosis present

## 2019-05-24 DIAGNOSIS — G8929 Other chronic pain: Secondary | ICD-10-CM | POA: Diagnosis present

## 2019-05-24 DIAGNOSIS — J9601 Acute respiratory failure with hypoxia: Secondary | ICD-10-CM | POA: Diagnosis present

## 2019-05-24 DIAGNOSIS — Z87891 Personal history of nicotine dependence: Secondary | ICD-10-CM

## 2019-05-24 DIAGNOSIS — E785 Hyperlipidemia, unspecified: Secondary | ICD-10-CM | POA: Diagnosis present

## 2019-05-24 DIAGNOSIS — E119 Type 2 diabetes mellitus without complications: Secondary | ICD-10-CM

## 2019-05-24 DIAGNOSIS — D649 Anemia, unspecified: Secondary | ICD-10-CM | POA: Diagnosis present

## 2019-05-24 DIAGNOSIS — U071 COVID-19: Secondary | ICD-10-CM | POA: Diagnosis not present

## 2019-05-24 DIAGNOSIS — M25552 Pain in left hip: Secondary | ICD-10-CM | POA: Diagnosis present

## 2019-05-24 DIAGNOSIS — M1990 Unspecified osteoarthritis, unspecified site: Secondary | ICD-10-CM | POA: Diagnosis present

## 2019-05-24 DIAGNOSIS — J189 Pneumonia, unspecified organism: Secondary | ICD-10-CM

## 2019-05-24 DIAGNOSIS — Z7982 Long term (current) use of aspirin: Secondary | ICD-10-CM

## 2019-05-24 DIAGNOSIS — R000 Tachycardia, unspecified: Secondary | ICD-10-CM | POA: Diagnosis present

## 2019-05-24 DIAGNOSIS — Z794 Long term (current) use of insulin: Secondary | ICD-10-CM

## 2019-05-24 DIAGNOSIS — I959 Hypotension, unspecified: Secondary | ICD-10-CM | POA: Diagnosis not present

## 2019-05-24 DIAGNOSIS — J1282 Pneumonia due to coronavirus disease 2019: Secondary | ICD-10-CM | POA: Diagnosis present

## 2019-05-24 DIAGNOSIS — T380X5A Adverse effect of glucocorticoids and synthetic analogues, initial encounter: Secondary | ICD-10-CM | POA: Diagnosis not present

## 2019-05-24 DIAGNOSIS — M25551 Pain in right hip: Secondary | ICD-10-CM | POA: Diagnosis present

## 2019-05-24 DIAGNOSIS — M791 Myalgia, unspecified site: Secondary | ICD-10-CM | POA: Diagnosis present

## 2019-05-24 DIAGNOSIS — Z6841 Body Mass Index (BMI) 40.0 and over, adult: Secondary | ICD-10-CM

## 2019-05-24 DIAGNOSIS — Z21 Asymptomatic human immunodeficiency virus [HIV] infection status: Secondary | ICD-10-CM | POA: Diagnosis present

## 2019-05-24 LAB — COMPREHENSIVE METABOLIC PANEL
ALT: 46 U/L — ABNORMAL HIGH (ref 0–44)
AST: 35 U/L (ref 15–41)
Albumin: 4 g/dL (ref 3.5–5.0)
Alkaline Phosphatase: 78 U/L (ref 38–126)
Anion gap: 14 (ref 5–15)
BUN: 14 mg/dL (ref 6–20)
CO2: 20 mmol/L — ABNORMAL LOW (ref 22–32)
Calcium: 8.1 mg/dL — ABNORMAL LOW (ref 8.9–10.3)
Chloride: 101 mmol/L (ref 98–111)
Creatinine, Ser: 0.94 mg/dL (ref 0.61–1.24)
GFR calc Af Amer: >60 mL/min (ref >60)
GFR calc non Af Amer: >60 mL/min (ref >60)
Glucose, Bld: 268 mg/dL — ABNORMAL HIGH (ref 70–99)
Potassium: 4.1 mmol/L (ref 3.5–5.1)
Sodium: 135 mmol/L (ref 135–145)
Total Bilirubin: 1.1 mg/dL (ref 0.3–1.2)
Total Protein: 7.9 g/dL (ref 6.5–8.1)

## 2019-05-24 LAB — TROPONIN I (HIGH SENSITIVITY): Troponin I (High Sensitivity): 4 ng/L (ref <18)

## 2019-05-24 LAB — CBC WITH DIFFERENTIAL/PLATELET
Abs Immature Granulocytes: 0.02 10*3/uL (ref 0.00–0.07)
Basophils Absolute: 0 10*3/uL (ref 0.0–0.1)
Basophils Relative: 0 %
Eosinophils Absolute: 0 10*3/uL (ref 0.0–0.5)
Eosinophils Relative: 0 %
HCT: 43.1 % (ref 39.0–52.0)
Hemoglobin: 13.9 g/dL (ref 13.0–17.0)
Immature Granulocytes: 0 %
Lymphocytes Relative: 27 %
Lymphs Abs: 1.3 10*3/uL (ref 0.7–4.0)
MCH: 29 pg (ref 26.0–34.0)
MCHC: 32.3 g/dL (ref 30.0–36.0)
MCV: 90 fL (ref 80.0–100.0)
Monocytes Absolute: 0.5 10*3/uL (ref 0.1–1.0)
Monocytes Relative: 9 %
Neutro Abs: 3 10*3/uL (ref 1.7–7.7)
Neutrophils Relative %: 64 %
Platelets: 260 10*3/uL (ref 150–400)
RBC: 4.79 MIL/uL (ref 4.22–5.81)
RDW: 12.6 % (ref 11.5–15.5)
WBC: 4.9 10*3/uL (ref 4.0–10.5)
nRBC: 0 % (ref 0.0–0.2)

## 2019-05-24 LAB — PROCALCITONIN: Procalcitonin: <0.1 ng/mL

## 2019-05-24 LAB — POC SARS CORONAVIRUS 2 AG: SARS Coronavirus 2 Ag: POSITIVE — AB

## 2019-05-24 LAB — BRAIN NATRIURETIC PEPTIDE: B Natriuretic Peptide: 16 pg/mL (ref 0.0–100.0)

## 2019-05-24 MED ORDER — LISINOPRIL 20 MG PO TABS
20.0000 mg | ORAL_TABLET | Freq: Every day | ORAL | Status: DC
  Administered 2019-05-25 – 2019-05-30 (×5): 20 mg via ORAL
  Filled 2019-05-24 (×2): qty 1
  Filled 2019-05-24: qty 2
  Filled 2019-05-24 (×2): qty 1
  Filled 2019-05-24: qty 2

## 2019-05-24 MED ORDER — INSULIN ASPART 100 UNIT/ML ~~LOC~~ SOLN
0.0000 [IU] | Freq: Three times a day (TID) | SUBCUTANEOUS | Status: DC
  Administered 2019-05-25: 8 [IU] via SUBCUTANEOUS
  Administered 2019-05-25: 09:00:00 15 [IU] via SUBCUTANEOUS
  Administered 2019-05-25: 19:00:00 3 [IU] via SUBCUTANEOUS
  Administered 2019-05-26 (×3): 8 [IU] via SUBCUTANEOUS
  Administered 2019-05-27: 15 [IU] via SUBCUTANEOUS
  Administered 2019-05-27 – 2019-05-28 (×3): 8 [IU] via SUBCUTANEOUS
  Filled 2019-05-24 (×10): qty 1

## 2019-05-24 MED ORDER — ASPIRIN EC 81 MG PO TBEC
81.0000 mg | DELAYED_RELEASE_TABLET | Freq: Every day | ORAL | Status: DC
  Administered 2019-05-25 – 2019-05-30 (×6): 81 mg via ORAL
  Filled 2019-05-24 (×6): qty 1

## 2019-05-24 MED ORDER — SIMVASTATIN 20 MG PO TABS
10.0000 mg | ORAL_TABLET | Freq: Every day | ORAL | Status: DC
  Administered 2019-05-25 – 2019-05-29 (×5): 10 mg via ORAL
  Filled 2019-05-24 (×5): qty 1

## 2019-05-24 MED ORDER — ACETAMINOPHEN 325 MG PO TABS
650.0000 mg | ORAL_TABLET | Freq: Once | ORAL | Status: AC | PRN
  Administered 2019-05-24: 650 mg via ORAL
  Filled 2019-05-24: qty 2

## 2019-05-24 MED ORDER — ENOXAPARIN SODIUM 40 MG/0.4ML ~~LOC~~ SOLN
40.0000 mg | SUBCUTANEOUS | Status: DC
  Administered 2019-05-24 – 2019-05-25 (×2): 40 mg via SUBCUTANEOUS
  Filled 2019-05-24 (×2): qty 0.4

## 2019-05-24 MED ORDER — ACETAMINOPHEN 325 MG PO TABS
650.0000 mg | ORAL_TABLET | Freq: Four times a day (QID) | ORAL | Status: DC | PRN
  Administered 2019-05-25 – 2019-05-27 (×6): 650 mg via ORAL
  Filled 2019-05-24 (×6): qty 2

## 2019-05-24 MED ORDER — INSULIN GLARGINE 100 UNIT/ML ~~LOC~~ SOLN
10.0000 [IU] | Freq: Every day | SUBCUTANEOUS | Status: DC
  Administered 2019-05-25: 10 [IU] via SUBCUTANEOUS
  Filled 2019-05-24 (×2): qty 0.1

## 2019-05-24 MED ORDER — FUROSEMIDE 40 MG PO TABS
40.0000 mg | ORAL_TABLET | Freq: Every day | ORAL | Status: DC
  Administered 2019-05-25 – 2019-05-30 (×6): 40 mg via ORAL
  Filled 2019-05-24 (×6): qty 1

## 2019-05-24 MED ORDER — METFORMIN HCL 500 MG PO TABS: 1000.0000 mg | ORAL_TABLET | Freq: Once | ORAL | Status: DC

## 2019-05-24 MED ORDER — POTASSIUM CHLORIDE CRYS ER 20 MEQ PO TBCR: 20.0000 meq | EXTENDED_RELEASE_TABLET | Freq: Every day | ORAL | Status: DC

## 2019-05-24 MED ORDER — SODIUM CHLORIDE 0.9 % IV BOLUS
500.0000 mL | Freq: Once | INTRAVENOUS | Status: AC
  Administered 2019-05-24: 500 mL via INTRAVENOUS

## 2019-05-24 MED ORDER — CYCLOBENZAPRINE HCL 10 MG PO TABS: 10.0000 mg | ORAL_TABLET | Freq: Four times a day (QID) | ORAL | Status: DC

## 2019-05-24 NOTE — ED Triage Notes (Signed)
Patient c/o cough, sob with exertion, body aches and racing heart X2 days. A&O x4 in triage

## 2019-05-24 NOTE — ED Notes (Signed)
ED Provider at bedside. 

## 2019-05-24 NOTE — ED Notes (Signed)
Pt provided lunch box and po fluids per request.

## 2019-05-24 NOTE — ED Provider Notes (Signed)
[facility] Emergency Department Provider Note  ____________________________________________   First MD Initiated Contact with Patient 05/24/19 1647     (approximate)  I have reviewed the triage vital signs and the nursing notes.   HISTORY  Chief Complaint Cough and Shortness of Breath    HPI [name] is a 44 y.o. male presents emergency department complaining of fever, body aches and a cough since Monday.  No known exposures to Covid.  States cough is productive of green mucus.  States I think I have pneumonia.  He denies cardiac type chest pain.  Denies swelling in extremities.  He denies any vomiting or diarrhea.    Past Medical History:  Diagnosis Date  . Anemia   . Anginal pain (HCC) several yrs ago  . Arthritis   . Asthma    as a child  . Diabetes mellitus without complication (HCC)   . H/O transfusion of packed red blood cells    with prior lumbar fusions x2  . Hypertension   . Sleep apnea    wore CPAP prior to tonsillectomy    Patient Active Problem List   Diagnosis Date Noted  . Type 2 diabetes mellitus without complication, with long-term current use of insulin (HCC) 09/18/2017  . Family history of bilateral hip replacements 08/25/2016  . Status post total replacement of right hip 09/25/2015  . Osteoarthritis of right hip 07/24/2015  . Morbid obesity (HCC) 07/24/2015  . Osteoarthritis of left hip 07/21/2015  . Status post total replacement of left hip 07/21/2015  . Lumbar stenosis with neurogenic claudication 01/14/2014  . Spinal stenosis, lumbar region, with neurogenic claudication 07/30/2012    Past Surgical History:  Procedure Laterality Date  . BACK SURGERY  march 2014 and sept 2015   lumbar fusion mrach L 3 to L 4 to L 5, sept L 2 to L 3  . TONSILLECTOMY  2005 march   for sleep apnea  . TOTAL HIP ARTHROPLASTY Left 07/21/2015   Procedure: LEFT TOTAL HIP ARTHROPLASTY ANTERIOR APPROACH;  Surgeon: Kathryne Hitch, MD;  Location: [facility] OR;  Service: Orthopedics;  Laterality: Left;  . TOTAL HIP ARTHROPLASTY Right 09/25/2015   Procedure: RIGHT TOTAL HIP ARTHROPLASTY ANTERIOR APPROACH;  Surgeon: Kathryne Hitch, MD;  Location: [facility];  Service: Orthopedics;  Laterality: Right;  . WISDOM TOOTH EXTRACTION      Prior to Admission medications   Medication Sig Start Date End Date Taking? Authorizing Provider  aspirin EC 81 MG tablet Take 81 mg by mouth daily.    [provider]  CARTIA XT 240 MG 24 hr capsule Take 240 mg by mouth daily. 04/25/16   [provider]  cyclobenzaprine (FLEXERIL) 10 MG tablet Take 10 mg by mouth 4 (four) times daily.  07/11/15   [provider]  diclofenac (VOLTAREN) 75 MG EC tablet TAKE 1 TABLET BY MOUTH TWICE DAILY AS NEEDED (TAKE  BETWEEN  MEALS) 02/19/19   Kathryne Hitch, MD  DILT-XR 240 MG 24 hr capsule Take 240 mg by mouth daily. 07/20/18   [provider]  EPINEPHrine 0.3 mg/0.3 mL IJ SOAJ injection Inject 0.3 mg into the muscle once as needed (For anaphylaxis.).     [provider]  furosemide (LASIX) 40 MG tablet Take 40 mg by mouth daily. 09/13/18   [provider]  ibuprofen (ADVIL) 800 MG tablet TAKE 1 TABLET BY MOUTH EVERY 8 HOURS AS NEEDED 04/08/19   Kathryne Hitch, MD  indomethacin (  INDOCIN) 50 MG capsule TAKE ONE CAPSULE BY MOUTH TWICE DAILY BETWEEN MEALS AS NEEDED 06/04/18   Kathryne Hitch, MD  KLOR-CON M20 20 MEQ tablet Take 20 mEq by mouth daily. 06/16/18   [provider]  lisinopril (PRINIVIL,ZESTRIL) 20 MG tablet  11/30/16   [provider]  metFORMIN (GLUCOPHAGE) 1000 MG tablet Take 1,000 mg by mouth 2 (two) times daily with a meal.    [provider]  naftifine (NAFTIN) 1 % cream Apply topically daily. 11/26/18   Hyatt, Max T, DPM  simvastatin (ZOCOR) 10 MG tablet Take 10 mg by mouth daily. 04/25/16   [provider]  Lawana Chambers 100-33 UNT-MCG/ML  SOPN  11/30/16   [provider]  terbinafine (LAMISIL) 250 MG tablet Take 1 tablet (250 mg total) by mouth daily. 04/24/19   Hyatt, Max T, DPM    Allergies Bee venom  History reviewed. No pertinent family history.  Social History Social History   Tobacco Use  . Smoking status: Former Smoker    Packs/day: 0.50    Years: 20.00    Pack years: 10.00    Types: Cigarettes    Quit date: 07/09/2012    Years since quitting: 6.8  . Smokeless tobacco: Never Used  Substance Use Topics  . Alcohol use: No  . Drug use: No    Review of Systems  Constitutional: Positive fever/chills Eyes: No visual changes. ENT: No sore throat. Respiratory: Positive cough Cardiovascular: Denies chest pain Gastrointestinal: Denies abdominal pain Genitourinary: Negative for dysuria. Musculoskeletal: Negative for back pain. Skin: Negative for rash. Psychiatric: no mood changes,     ____________________________________________   PHYSICAL EXAM:  VITAL SIGNS: ED Triage Vitals  Enc Vitals Group     BP 05/24/19 1621 135/86     Pulse Rate 05/24/19 1621 (!) 117     Resp 05/24/19 1621 18     Temp 05/24/19 1621 (!) 100.5 F (38.1 C)     Temp Source 05/24/19 1621 Oral     SpO2 05/24/19 1621 94 %     Weight 05/24/19 1622 (!) 340 lb (154.2 kg)     Height 05/24/19 1622 6\' 2"  (1.88 m)     Head Circumference --      Peak Flow --      Pain Score 05/24/19 1621 3     Pain Loc --      Pain Edu? --      Excl. in GC? --     Constitutional: Alert and oriented. Well appearing and in no acute distress. Eyes: Conjunctivae are normal.  Head: Atraumatic. Nose: No congestion/rhinnorhea. Mouth/Throat: Mucous membranes are moist.   Neck:  supple no lymphadenopathy noted Cardiovascular: Normal rate, regular rhythm. Heart sounds are normal Respiratory: Normal respiratory effort.  No retractions, lungs c t a  Abd: soft nontender bs normal all 4 quad GU: deferred Musculoskeletal: FROM all extremities,  warm and well perfused Neurologic:  Normal speech and language.  Skin:  Skin is warm, dry and intact. No rash noted. Psychiatric: Mood and affect are normal. Speech and behavior are normal.  ____________________________________________   LABS (all labs ordered are listed, but only abnormal results are displayed)  Labs Reviewed  COMPREHENSIVE METABOLIC PANEL - Abnormal; Notable for the following components:      Result Value   CO2 20 (*)    Glucose, Bld 268 (*)    Calcium 8.1 (*)    ALT 46 (*)    All other components within normal limits  POC  SARS CORONAVIRUS 2 AG - Abnormal; Notable for the following components:   SARS Coronavirus 2 Ag POSITIVE (*)    All other components within normal limits  CBC WITH DIFFERENTIAL/PLATELET  PROCALCITONIN  BRAIN NATRIURETIC PEPTIDE  POC SARS CORONAVIRUS 2 AG -  ED  TROPONIN I (HIGH SENSITIVITY)   ____________________________________________   ____________________________________________  RADIOLOGY  Chest x-ray shows a middle lobe pneumonia  ____________________________________________   PROCEDURES  Procedure(s) performed: Saline lock, normal saline 500 mg IV, Metformin 1000 mg p.o.  Procedures    ____________________________________________   INITIAL IMPRESSION / ASSESSMENT AND PLAN / ED COURSE  Pertinent labs & imaging results that were available during my care of the patient were reviewed by me and considered in my medical decision making (see chart for details).   Patient is 44 year old male presents emergency department complaint of fever, chills, cough and shortness of breath.  States feels like he has pneumonia.  No known Covid exposures.  Physical exam shows patient to be tachycardic and slightly hypoxic at 94% on room air, lungs are clear to all station.  Patient does not appear to feel well at all.  DDx: CAP, Covid, influenza, congestive heart failure  CBC is normal, comprehensive metabolic panel has increased  glucose of 268 with slight elevation in the ALT of 46, CO2 is 20, POC Covid test is positive, troponin and BNP are pending, procalcitonin still pending  Discussed the test results with the patient.  Due to his elevated heart rate of 125 along with his hypoxic level I feel that he will not do well going home immediately.  Discussed this with Dr. Archie Balboa and he agrees.  Paged the hospitalist who will be admitting him for overnight.  Patient does appear to be stable at this time.    [name] was evaluated in Emergency Department on 05/24/2019 for the symptoms described in the history of present illness. He was evaluated in the context of the global COVID-19 pandemic, which necessitated consideration that the patient might be at risk for infection with the SARS-CoV-2 virus that causes COVID-19. Institutional protocols and algorithms that pertain to the evaluation of patients at risk for COVID-19 are in a state of rapid change based on information released by regulatory bodies including the CDC and federal and state organizations. These policies and algorithms were followed during the patient's care in the ED.   As part of my medical decision making, I reviewed the following data within the [place] notes reviewed and incorporated, Labs reviewed see above, EKG interpreted tachycardia, Old chart reviewed, Radiograph reviewed chest x-ray shows right middle lobe pneumonia, Discussed with admitting physician hospitalist, Notes from prior ED visits and [place] Controlled Substance Database  ____________________________________________   FINAL CLINICAL IMPRESSION(S) / ED DIAGNOSES  Final diagnoses:  COVID-19  Community acquired pneumonia of right middle lobe of lung  Tachycardia      NEW MEDICATIONS STARTED DURING THIS VISIT:  New Prescriptions   No medications on file     Note:  This document was prepared using Dragon voice recognition software and may include  unintentional dictation errors.    Versie Starks, PA-C 05/24/19 1827    Nance Pear, MD 05/24/19 Dorthula Perfect

## 2019-05-24 NOTE — ED Notes (Signed)
Pt c/o body aches and fever since Monday- states he feels like he has pneumonia.

## 2019-05-24 NOTE — ED Notes (Signed)
Spoke with wife at this time and updated about admission status.

## 2019-05-24 NOTE — H&P (Signed)
History and Physical    [name] UXN:[id number] DOB: 25-Apr-1976 DOA: 05/24/2019  PCP: [name]  Patient coming from: Home  I have personally briefly reviewed patient's old medical records in [place]  Chief Complaint: body ache  HPI: [name] is a 44 y.o. male with medical history significant of insulin-dependent type 2 diabetes, lumbar stenosis with neurogenic claudication, and morbid obesity who presents with concerns of body ache and shortness of breath. His symptoms started about 3 days ago when he noted upper body ache and progressive shortness of breath yesterday.  He denies any chest pain.  No nausea, vomiting or diarrhea.  No decreased p.o. intake.  Wife at home recently loss her sense of taste and is awaiting the results of her Covid test.  ED Course: He was febrile up to 100.5, tachycardic up to 120, normotensive on room air. CBC was unremarkable.  Sodium of 135, K of 4.1, glucose of 268, creatinine of 0.94. Troponin of 4.  Procalcitonin of less than 0.10. Covid test positive. Chest x-ray showed right middle lobe pneumonia.  Review of Systems:  Constitutional: No Weight Change, + Fever ENT/Mouth: No sore throat, No Rhinorrhea Eyes: No Vision Changes Cardiovascular: No Chest Pain, no SOB Respiratory: No Cough, No Sputum,  Gastrointestinal: No Nausea, No Vomiting, No Diarrhea, No Constipation, No Pain Genitourinary: no dysuria Musculoskeletal: No Arthralgias, + Myalgias Skin: No Skin Lesions, No Pruritus, Neuro: no Weakness, No Numbness,   Psych:  no decrease appetite Heme/Lymph: No Bruising, No Bleeding  Past Medical History:  Diagnosis Date  . Anemia   . Anginal pain ([place]) several yrs ago  . Arthritis   . Asthma    as a child  . Diabetes mellitus without complication ([place])   . H/O transfusion of packed red blood cells    with prior lumbar fusions x2  . Hypertension   . Sleep apnea    wore CPAP prior to tonsillectomy      Past Surgical History:  Procedure Laterality Date  . BACK SURGERY  march 2014 and sept 2015   lumbar fusion mrach L 3 to L 4 to L 5, sept L 2 to L 3  . TONSILLECTOMY  2005 march   for sleep apnea  . TOTAL HIP ARTHROPLASTY Left 07/21/2015   Procedure: LEFT TOTAL HIP ARTHROPLASTY ANTERIOR APPROACH;  Surgeon: Mcarthur Rossetti, MD;  Location: [facility];  Service: Orthopedics;  Laterality: Left;  . TOTAL HIP ARTHROPLASTY Right 09/25/2015   Procedure: RIGHT TOTAL HIP ARTHROPLASTY ANTERIOR APPROACH;  Surgeon: Mcarthur Rossetti, MD;  Location: [facility];  Service: Orthopedics;  Laterality: Right;  . WISDOM TOOTH EXTRACTION       reports that he quit smoking about 6 years ago. His smoking use included cigarettes. He has a 10.00 pack-year smoking history. He has never used smokeless tobacco. He reports that he does not drink alcohol or use drugs.  Allergies  Allergen Reactions  . Bee Venom Anaphylaxis, Swelling and Other (See Comments)    Swelling primarily at sting site, but can be all over.  Epi pen prn     Prior to Admission medications   Medication Sig Start Date End Date Taking? Authorizing Provider  aspirin EC 81 MG tablet Take 81 mg by mouth daily.   Yes [provider]  cyclobenzaprine (FLEXERIL) 10 MG tablet Take 10 mg by mouth 4 (four) times daily.  07/11/15  Yes [provider]  diclofenac (VOLTAREN) 75 MG EC tablet  TAKE 1 TABLET BY MOUTH TWICE DAILY AS NEEDED (TAKE  BETWEEN  MEALS) 02/19/19  Yes Kathryne Hitch, MD  furosemide (LASIX) 40 MG tablet Take 40 mg by mouth daily. 09/13/18  Yes [provider]  KLOR-CON M20 20 MEQ tablet Take 20 mEq by mouth daily. 06/16/18  Yes [provider]  lisinopril (PRINIVIL,ZESTRIL) 20 MG tablet Take 20 mg by mouth daily.  11/30/16  Yes [provider]  metFORMIN (GLUCOPHAGE) 1000 MG tablet Take 1,000 mg by mouth 2 (two) times daily with a meal.   Yes [provider]  simvastatin  (ZOCOR) 10 MG tablet Take 10 mg by mouth daily. 04/25/16  Yes [provider]  SOLIQUA 100-33 UNT-MCG/ML SOPN Inject 60 Units into the skin daily.  11/30/16  Yes [provider]  CARTIA XT 240 MG 24 hr capsule Take 240 mg by mouth daily. 04/25/16   [provider]  DILT-XR 240 MG 24 hr capsule Take 240 mg by mouth daily. 07/20/18   [provider]  EPINEPHrine 0.3 mg/0.3 mL IJ SOAJ injection Inject 0.3 mg into the muscle once as needed (For anaphylaxis.).     [provider]  ibuprofen (ADVIL) 800 MG tablet TAKE 1 TABLET BY MOUTH EVERY 8 HOURS AS NEEDED Patient not taking: Reported on 05/24/2019 04/08/19   Kathryne Hitch, MD  indomethacin (INDOCIN) 50 MG capsule TAKE ONE CAPSULE BY MOUTH TWICE DAILY BETWEEN MEALS AS NEEDED Patient not taking: Reported on 05/24/2019 06/04/18   Kathryne Hitch, MD  naftifine (NAFTIN) 1 % cream Apply topically daily. Patient not taking: Reported on 05/24/2019 11/26/18   Hyatt, Max T, DPM  terbinafine (LAMISIL) 250 MG tablet Take 1 tablet (250 mg total) by mouth daily. Patient not taking: Reported on 05/24/2019 04/24/19   Elinor Parkinson, [place]    Physical Exam: Vitals:   05/24/19 1621 05/24/19 1622 05/24/19 1722 05/24/19 1724  BP: 135/86  140/85   Pulse: (!) 117  (!) 117 (!) 125  Resp: 18  18 (!) 22  Temp: (!) 100.5 F (38.1 C)  99.2 F (37.3 C)   TempSrc: Oral  Oral   SpO2: 94%  94% 95%  Weight:  (!) 154.2 kg    Height:  6\' 2"  (1.88 m)      Constitutional: NAD, calm, comfortable, asleep in bed Vitals:   05/24/19 1621 05/24/19 1622 05/24/19 1722 05/24/19 1724  BP: 135/86  140/85   Pulse: (!) 117  (!) 117 (!) 125  Resp: 18  18 (!) 22  Temp: (!) 100.5 F (38.1 C)  99.2 F (37.3 C)   TempSrc: Oral  Oral   SpO2: 94%  94% 95%  Weight:  (!) 154.2 kg    Height:  6\' 2"  (1.88 m)     Eyes: PERRL, lids normal with mildy erythematous conjunctivae  ENMT: Mucous membranes are moist.  Neck: normal,  supple Respiratory: clear to auscultation bilaterally, no wheezing, no crackles. Normal respiratory effort. No accessory muscle use.  Cardiovascular: Regular rate and rhythm, no murmurs / rubs / gallops. No extremity edema.   Abdomen: no tenderness, no masses palpated.  Bowel sounds positive.  Musculoskeletal: no clubbing / cyanosis. No joint deformity upper and lower extremities. Good ROM, no contractures. Normal muscle tone.  Skin: no rashes, lesions, ulcers. No induration Neurologic: CN 2-12 grossly intact. Sensation intact. Strength 5/5 in all 4.  Psychiatric: Normal judgment and insight. Alert and oriented x 3. Normal mood.     Labs on  Admission: I have personally reviewed following labs and imaging studies  CBC: Recent Labs  Lab 05/24/19 1626  WBC 4.9  NEUTROABS 3.0  HGB 13.9  HCT 43.1  MCV 90.0  PLT 260   Basic Metabolic Panel: Recent Labs  Lab 05/24/19 1626  NA 135  K 4.1  CL 101  CO2 20*  GLUCOSE 268*  BUN 14  CREATININE 0.94  CALCIUM 8.1*   GFR: Estimated Creatinine Clearance: 155.8 mL/min (by C-G formula based on SCr of 0.94 mg/dL). Liver Function Tests: Recent Labs  Lab 05/24/19 1626  AST 35  ALT 46*  ALKPHOS 78  BILITOT 1.1  PROT 7.9  ALBUMIN 4.0   No results for input(s): LIPASE, AMYLASE in the last 168 hours. No results for input(s): AMMONIA in the last 168 hours. Coagulation Profile: No results for input(s): INR, PROTIME in the last 168 hours. Cardiac Enzymes: No results for input(s): CKTOTAL, CKMB, CKMBINDEX, TROPONINI in the last 168 hours. BNP (last 3 results) No results for input(s): PROBNP in the last 8760 hours. HbA1C: No results for input(s): HGBA1C in the last 72 hours. CBG: No results for input(s): GLUCAP in the last 168 hours. Lipid Profile: No results for input(s): CHOL, HDL, LDLCALC, TRIG, CHOLHDL, LDLDIRECT in the last 72 hours. Thyroid Function Tests: No results for input(s): TSH, T4TOTAL, FREET4, T3FREE, THYROIDAB in the  last 72 hours. Anemia Panel: No results for input(s): VITAMINB12, FOLATE, FERRITIN, TIBC, IRON, RETICCTPCT in the last 72 hours. Urine analysis:    Component Value Date/Time   COLORURINE YELLOW 07/26/2015 1130   APPEARANCEUR CLEAR 07/26/2015 1130   LABSPEC 1.031 (H) 07/26/2015 1130   PHURINE 5.5 07/26/2015 1130   GLUCOSEU >1000 (A) 07/26/2015 1130   HGBUR NEGATIVE 07/26/2015 1130   BILIRUBINUR NEGATIVE 07/26/2015 1130   KETONESUR NEGATIVE 07/26/2015 1130   PROTEINUR NEGATIVE 07/26/2015 1130   NITRITE NEGATIVE 07/26/2015 1130   LEUKOCYTESUR NEGATIVE 07/26/2015 1130    Radiological Exams on Admission: DG Chest 2 View  Result Date: 05/24/2019 CLINICAL DATA:  Cough, shortness of breath and fever since Monday EXAM: CHEST - 2 VIEW COMPARISON:  Radiograph 04/30/2016 FINDINGS: Consolidation present in the right middle lobe partially silhouetting portion of the right heart border. No pneumothorax. No effusion. No acute osseous or soft tissue abnormality. Degenerative changes are present in the imaged spine and shoulders. IMPRESSION: Right middle lobe pneumonia. Follow-up to resolution is recommended. Electronically Signed   By: Kreg Shropshire M.D.   On: 05/24/2019 17:20    EKG: Independently reviewed.   Assessment/Plan  Myalgias secondary to COVID infection S/p 500 IV fluid bolus Monitor inflammatory markers Continuous pulse ox monitoring   Type 2 diabetes Normally on 60 units of Soliqua BG of 268 on admit Start with 10 units lantus and moderate SSI   Morbid obesity BMI > 40 Complicates comorbidities  hypertension continue Lasix and lisinopril   Hyperlipidemia continue statin  DVT prophylaxis:.Lovenox Code Status: Full Family Communication: Plan discussed with patient at bedside  disposition Plan: Home with at least 2 midnight stays  Consults called:  Admission status: inpatient   Tisha Cline T Ridhima Golberg DO [facility]   If 7PM-7AM, please contact  night-coverage [email or web address] Password [facility]  05/24/2019, 11:17 PM

## 2019-05-25 DIAGNOSIS — E119 Type 2 diabetes mellitus without complications: Secondary | ICD-10-CM | POA: Diagnosis not present

## 2019-05-25 DIAGNOSIS — I10 Essential (primary) hypertension: Secondary | ICD-10-CM | POA: Diagnosis present

## 2019-05-25 DIAGNOSIS — M545 Low back pain: Secondary | ICD-10-CM | POA: Diagnosis present

## 2019-05-25 DIAGNOSIS — Z7982 Long term (current) use of aspirin: Secondary | ICD-10-CM | POA: Diagnosis not present

## 2019-05-25 DIAGNOSIS — Z96643 Presence of artificial hip joint, bilateral: Secondary | ICD-10-CM | POA: Diagnosis present

## 2019-05-25 DIAGNOSIS — G8929 Other chronic pain: Secondary | ICD-10-CM | POA: Diagnosis present

## 2019-05-25 DIAGNOSIS — M791 Myalgia, unspecified site: Secondary | ICD-10-CM | POA: Diagnosis present

## 2019-05-25 DIAGNOSIS — M48062 Spinal stenosis, lumbar region with neurogenic claudication: Secondary | ICD-10-CM | POA: Diagnosis present

## 2019-05-25 DIAGNOSIS — M25552 Pain in left hip: Secondary | ICD-10-CM | POA: Diagnosis present

## 2019-05-25 DIAGNOSIS — U071 COVID-19: Secondary | ICD-10-CM | POA: Diagnosis present

## 2019-05-25 DIAGNOSIS — Z91030 Bee allergy status: Secondary | ICD-10-CM | POA: Diagnosis not present

## 2019-05-25 DIAGNOSIS — M1990 Unspecified osteoarthritis, unspecified site: Secondary | ICD-10-CM | POA: Diagnosis present

## 2019-05-25 DIAGNOSIS — R000 Tachycardia, unspecified: Secondary | ICD-10-CM | POA: Diagnosis present

## 2019-05-25 DIAGNOSIS — J9601 Acute respiratory failure with hypoxia: Secondary | ICD-10-CM | POA: Diagnosis present

## 2019-05-25 DIAGNOSIS — J1282 Pneumonia due to coronavirus disease 2019: Secondary | ICD-10-CM | POA: Diagnosis present

## 2019-05-25 DIAGNOSIS — E1165 Type 2 diabetes mellitus with hyperglycemia: Secondary | ICD-10-CM | POA: Diagnosis present

## 2019-05-25 DIAGNOSIS — D649 Anemia, unspecified: Secondary | ICD-10-CM | POA: Diagnosis present

## 2019-05-25 DIAGNOSIS — E785 Hyperlipidemia, unspecified: Secondary | ICD-10-CM | POA: Diagnosis present

## 2019-05-25 DIAGNOSIS — M25551 Pain in right hip: Secondary | ICD-10-CM | POA: Diagnosis present

## 2019-05-25 DIAGNOSIS — Z87891 Personal history of nicotine dependence: Secondary | ICD-10-CM | POA: Diagnosis not present

## 2019-05-25 DIAGNOSIS — Z21 Asymptomatic human immunodeficiency virus [HIV] infection status: Secondary | ICD-10-CM | POA: Diagnosis present

## 2019-05-25 DIAGNOSIS — Z6841 Body Mass Index (BMI) 40.0 and over, adult: Secondary | ICD-10-CM | POA: Diagnosis not present

## 2019-05-25 DIAGNOSIS — J45909 Unspecified asthma, uncomplicated: Secondary | ICD-10-CM | POA: Diagnosis present

## 2019-05-25 DIAGNOSIS — Z794 Long term (current) use of insulin: Secondary | ICD-10-CM | POA: Diagnosis not present

## 2019-05-25 LAB — C-REACTIVE PROTEIN: CRP: 12.2 mg/dL — ABNORMAL HIGH (ref <1.0)

## 2019-05-25 LAB — CBC WITH DIFFERENTIAL/PLATELET
Abs Immature Granulocytes: 0.05 10*3/uL (ref 0.00–0.07)
Basophils Absolute: 0 10*3/uL (ref 0.0–0.1)
Basophils Relative: 1 %
Eosinophils Absolute: 0 10*3/uL (ref 0.0–0.5)
Eosinophils Relative: 0 %
HCT: 41.3 % (ref 39.0–52.0)
Hemoglobin: 13.1 g/dL (ref 13.0–17.0)
Immature Granulocytes: 1 %
Lymphocytes Relative: 26 %
Lymphs Abs: 1.1 10*3/uL (ref 0.7–4.0)
MCH: 28.9 pg (ref 26.0–34.0)
MCHC: 31.7 g/dL (ref 30.0–36.0)
MCV: 91.2 fL (ref 80.0–100.0)
Monocytes Absolute: 0.4 10*3/uL (ref 0.1–1.0)
Monocytes Relative: 8 %
Neutro Abs: 2.8 10*3/uL (ref 1.7–7.7)
Neutrophils Relative %: 64 %
Platelets: 248 10*3/uL (ref 150–400)
RBC: 4.53 MIL/uL (ref 4.22–5.81)
RDW: 12.9 % (ref 11.5–15.5)
WBC: 4.4 10*3/uL (ref 4.0–10.5)
nRBC: 0 % (ref 0.0–0.2)

## 2019-05-25 LAB — COMPREHENSIVE METABOLIC PANEL
ALT: 40 U/L (ref 0–44)
AST: 34 U/L (ref 15–41)
Albumin: 3.8 g/dL (ref 3.5–5.0)
Alkaline Phosphatase: 73 U/L (ref 38–126)
Anion gap: 11 (ref 5–15)
BUN: 20 mg/dL (ref 6–20)
CO2: 23 mmol/L (ref 22–32)
Calcium: 7.9 mg/dL — ABNORMAL LOW (ref 8.9–10.3)
Chloride: 99 mmol/L (ref 98–111)
Creatinine, Ser: 1.28 mg/dL — ABNORMAL HIGH (ref 0.61–1.24)
GFR calc Af Amer: >60 mL/min (ref >60)
GFR calc non Af Amer: >60 mL/min (ref >60)
Glucose, Bld: 464 mg/dL — ABNORMAL HIGH (ref 70–99)
Potassium: 4.5 mmol/L (ref 3.5–5.1)
Sodium: 133 mmol/L — ABNORMAL LOW (ref 135–145)
Total Bilirubin: 0.7 mg/dL (ref 0.3–1.2)
Total Protein: 7.3 g/dL (ref 6.5–8.1)

## 2019-05-25 LAB — GLUCOSE, CAPILLARY
Glucose-Capillary: 385 mg/dL — ABNORMAL HIGH (ref 70–99)
Glucose-Capillary: 277 mg/dL — ABNORMAL HIGH (ref 70–99)
Glucose-Capillary: 192 mg/dL — ABNORMAL HIGH (ref 70–99)
Glucose-Capillary: 267 mg/dL — ABNORMAL HIGH (ref 70–99)

## 2019-05-25 LAB — HIV ANTIBODY (ROUTINE TESTING W REFLEX): HIV Screen 4th Generation wRfx: REACTIVE — AB

## 2019-05-25 LAB — FERRITIN: Ferritin: 203 ng/mL (ref 24–336)

## 2019-05-25 LAB — FIBRIN DERIVATIVES D-DIMER (ARMC ONLY): Fibrin derivatives D-dimer (ARMC): 774.79 ng/mL (FEU) — ABNORMAL HIGH (ref 0.00–499.00)

## 2019-05-25 LAB — TROPONIN I (HIGH SENSITIVITY): Troponin I (High Sensitivity): 3 ng/L (ref <18)

## 2019-05-25 MED ORDER — KETOROLAC TROMETHAMINE 30 MG/ML IJ SOLN: 30.0000 mg | Freq: Four times a day (QID) | INTRAMUSCULAR | Status: DC | PRN

## 2019-05-25 MED ORDER — TRAMADOL HCL 50 MG PO TABS: 50.0000 mg | ORAL_TABLET | Freq: Four times a day (QID) | ORAL | Status: DC | PRN

## 2019-05-25 MED ORDER — INSULIN GLARGINE 100 UNIT/ML ~~LOC~~ SOLN
15.0000 [IU] | Freq: Every day | SUBCUTANEOUS | Status: DC
  Administered 2019-05-25: 15 [IU] via SUBCUTANEOUS
  Filled 2019-05-25 (×2): qty 0.15

## 2019-05-25 MED ORDER — INSULIN ASPART 100 UNIT/ML ~~LOC~~ SOLN
10.0000 [IU] | Freq: Three times a day (TID) | SUBCUTANEOUS | Status: DC
  Administered 2019-05-26 (×2): 10 [IU] via SUBCUTANEOUS
  Filled 2019-05-25 (×2): qty 1

## 2019-05-25 MED ORDER — ONDANSETRON HCL 4 MG/2ML IJ SOLN: 4.0000 mg | Freq: Four times a day (QID) | INTRAMUSCULAR | Status: DC | PRN

## 2019-05-25 MED ORDER — INSULIN GLARGINE 100 UNIT/ML ~~LOC~~ SOLN: 20.0000 [IU] | Freq: Every day | SUBCUTANEOUS | Status: DC

## 2019-05-25 MED ORDER — SODIUM CHLORIDE 0.9 % IV SOLN
100.0000 mg | Freq: Every day | INTRAVENOUS | Status: AC
  Administered 2019-05-26 – 2019-05-29 (×4): 100 mg via INTRAVENOUS
  Filled 2019-05-25 (×5): qty 20

## 2019-05-25 MED ORDER — ZINC SULFATE 220 (50 ZN) MG PO CAPS
220.0000 mg | ORAL_CAPSULE | Freq: Every day | ORAL | Status: DC
  Administered 2019-05-26 – 2019-05-30 (×5): 220 mg via ORAL
  Filled 2019-05-25 (×5): qty 1

## 2019-05-25 MED ORDER — SODIUM CHLORIDE 0.9 % IV SOLN
200.0000 mg | Freq: Once | INTRAVENOUS | Status: AC
  Administered 2019-05-25: 200 mg via INTRAVENOUS
  Filled 2019-05-25: qty 200

## 2019-05-25 NOTE — ED Notes (Signed)
Switched stretcher to hospital bed.

## 2019-05-25 NOTE — Progress Notes (Signed)
Belongings at bedside include black smartphone, bluetooth ear piece, smart watch

## 2019-05-25 NOTE — ED Notes (Signed)
Transportation requested  

## 2019-05-25 NOTE — ED Notes (Signed)
Report to [place], Charity fundraiser. Pt to go to 122 per RN.

## 2019-05-25 NOTE — Progress Notes (Signed)
Remdesivir - Pharmacy Brief Note   O:  ALT: 40 CXR: Right middle lobe pneumonia. Follow-up to resolution is recommended. SpO2: 94% on 3L   A/P:  Remdesivir 200 mg IVPB once followed by 100 mg IVPB daily x 4 days.   Charlies Silvers, RPh 05/25/2019 3:38 PM

## 2019-05-25 NOTE — Progress Notes (Addendum)
PROGRESS NOTE    [name]  ZHY:[id number] DOB: 1975-12-07 DOA: 05/24/2019  PCP: [name]    LOS - 0   Brief Narrative:  44 y.o. male with medical history of insulin-dependent type 2 diabetes, lumbar stenosis with neurogenic claudication, and morbid obesity who presented to ED on 1/15 with body aches, progressive shortness of breath for past 3 days.  In the ED, febrile 100.5, tachycardic up to 120, no hypoxia.  Labs notable for no leukocytosis, negative procalcitonin, negative troponin and BNP.  Covid-19 antigen positive.  Chest x-ray showed right middle lobe pneumonia.  Admitted to hospitalist service, treating with remdesivir, vitamin C, zine   Subjective 1/16: Patient seen this AM in ED on hold for a bed.  He reports ongoing body aches all over, feels profoundly tired and weak.  Has had fever/chills.  Has intermittent shortness of breath.  Denies chest pain, nausea vomiting or diarrhea.  Also reports chronic bilateral hip and low back pain and states he cannot get comfortable on ED stretcher.  No acute events reported.  Assessment & Plan:   Principal Problem:   COVID-19 virus infection Active Problems:   Morbid obesity ([place])   Type 2 diabetes mellitus without complication, with long-term current use of insulin ([place])   Essential hypertension   Dyslipidemia   COVID-19 infection Myalgias secondary to above Generalized weakness secondary to above --Start remdesivir --Monitor inflammatory markers --No steroid given the hypoxia --Continuous pulse ox  --Vitamin C and zinc --Aspirin --CRP is still pending --Monitor inflammatory markers  Positive HIV test -unknown if previously diagnosed, have not yet discussed this with patient --CD4 count and viral load with morning labs --Likely consult ID --will discuss with patient tomorrow once CD4 count viral load returned  Type 2 diabetes with hyperglycemia Takes 60 units of Soliqua at home.  BG uncontrolled  at 268 on admission. --Increased Lantus to 15 units --Add NovoLog 10 units with meals --Continue moderate sliding scale NovoLog --Titrate insulin as needed for glucose 140-180 --A1c with morning labs  Morbid obesity BMI > 40 Complicates comorbidities  hypertension continue Lasix and lisinopril   Hyperlipidemia continue statin   DVT prophylaxis: Lovenox   Code Status: Full Code  Family Communication: None at bedside Disposition Plan: Expect discharge home in 1 to 2 days.  Possible referral to [place] infusion clinic to complete remdesivir course   Severity of Illness: The appropriate patient status for this patient is INPATIENT due to. Inpatient status is judged to be reasonable and necessary in order to provide the required intensity of service to ensure the patient's safety given high risk for further clinical deterioration. Furthermore, it is not anticipated that the patient will be medically stable for discharge from the hospital within 2 midnights of admission. The following factors support the patient status of inpatient.    "           The patient's presenting symptoms include  myalgias, weakness, shortness of breath. "           The worrisome physical exam findings include  decreased breath sounds, febrile. "           The initial radiographic and laboratory data are worrisome because of  chest x-ray with right middle lobe pneumonia, COVID-19 positive,. "           The chronic co-morbidities include  type 2 diabetes, hyperlipidemia, morbid obesity, HIV positive.     * I certify that at the point of admission it  is my clinical judgment that the patient will require inpatient hospital care spanning beyond 2 midnights from the point of admission due to high intensity of service, high risk for further deterioration and high frequency of surveillance required.*    Consultants:   None  Procedures:   None  Antimicrobials:   None   Objective: Vitals:   05/24/19 1722  05/24/19 1724 05/25/19 0613 05/25/19 0720  BP: 140/85  113/79 114/75  Pulse: (!) 117 (!) 125 (!) 135 (!) 129  Resp: 18 (!) 22  20  Temp: 99.2 F (37.3 C)  (!) 102.3 F (39.1 C) (!) 101.6 F (38.7 C)  TempSrc: Oral  Oral Oral  SpO2: 94% 95% 92% 93%  Weight:      Height:        Intake/Output Summary (Last 24 hours) at 05/25/2019 1208 Last data filed at 05/25/2019 0800 Gross per 24 hour  Intake --  Output 1200 ml  Net -1200 ml   Filed Weights   05/24/19 1622  Weight: (!) 154.2 kg    Examination:  General exam: awake, alert, no acute distress, obese, ill-appearing HEENT: atraumatic, clear conjunctiva, anicteric sclera, moist mucus membranes, hearing grossly normal  Respiratory system: Decreased breath sounds, no wheezes, rales or rhonchi, normal respiratory effort. Cardiovascular system: normal S1/S2, RRR, no JVD, murmurs, rubs, gallops, no pedal edema.   Gastrointestinal system: soft, non-tender, non-distended abdomen, no organomegaly or masses felt, normal bowel sounds. Central nervous system: alert and oriented x4. no gross focal neurologic deficits, normal speech Extremities: moves all, no edema, normal tone Skin: dry, intact, very warm to touch Psychiatry: normal mood, congruent affect, judgement and insight appear normal    Data Reviewed: I have personally reviewed following labs and imaging studies  CBC: Recent Labs  Lab 05/24/19 1626 05/25/19 0302  WBC 4.9 4.4  NEUTROABS 3.0 2.8  HGB 13.9 13.1  HCT 43.1 41.3  MCV 90.0 91.2  PLT 260 248   Basic Metabolic Panel: Recent Labs  Lab 05/24/19 1626 05/25/19 0302  NA 135 133*  K 4.1 4.5  CL 101 99  CO2 20* 23  GLUCOSE 268* 464*  BUN 14 20  CREATININE 0.94 1.28*  CALCIUM 8.1* 7.9*   GFR: Estimated Creatinine Clearance: 114.4 mL/min (A) (by C-G formula based on SCr of 1.28 mg/dL (H)). Liver Function Tests: Recent Labs  Lab 05/24/19 1626 05/25/19 0302  AST 35 34  ALT 46* 40  ALKPHOS 78 73  BILITOT  1.1 0.7  PROT 7.9 7.3  ALBUMIN 4.0 3.8   No results for input(s): LIPASE, AMYLASE in the last 168 hours. No results for input(s): AMMONIA in the last 168 hours. Coagulation Profile: No results for input(s): INR, PROTIME in the last 168 hours. Cardiac Enzymes: No results for input(s): CKTOTAL, CKMB, CKMBINDEX, TROPONINI in the last 168 hours. BNP (last 3 results) No results for input(s): PROBNP in the last 8760 hours. HbA1C: No results for input(s): HGBA1C in the last 72 hours. CBG: Recent Labs  Lab 05/25/19 0755  GLUCAP 385*   Lipid Profile: No results for input(s): CHOL, HDL, LDLCALC, TRIG, CHOLHDL, LDLDIRECT in the last 72 hours. Thyroid Function Tests: No results for input(s): TSH, T4TOTAL, FREET4, T3FREE, THYROIDAB in the last 72 hours. Anemia Panel: Recent Labs    05/25/19 0302  FERRITIN 203   Sepsis Labs: Recent Labs  Lab 05/24/19 1808  PROCALCITON <0.10    No results found for this or any previous visit (from the past 240 hour(s)).  Radiology Studies: DG Chest 2 View  Result Date: 05/24/2019 CLINICAL DATA:  Cough, shortness of breath and fever since Monday EXAM: CHEST - 2 VIEW COMPARISON:  Radiograph 04/30/2016 FINDINGS: Consolidation present in the right middle lobe partially silhouetting portion of the right heart border. No pneumothorax. No effusion. No acute osseous or soft tissue abnormality. Degenerative changes are present in the imaged spine and shoulders. IMPRESSION: Right middle lobe pneumonia. Follow-up to resolution is recommended. Electronically Signed   By: Kreg Shropshire M.D.   On: 05/24/2019 17:20        Scheduled Meds: . aspirin EC  81 mg Oral Daily  . enoxaparin (LOVENOX) injection  40 mg Subcutaneous Q24H  . furosemide  40 mg Oral Daily  . insulin aspart  0-15 Units Subcutaneous TID WC  . insulin glargine  10 Units Subcutaneous QHS  . lisinopril  20 mg Oral Daily  . simvastatin  10 mg Oral Daily   Continuous Infusions: .  [START ON 05/26/2019] remdesivir 100 mg in NS 100 mL       LOS: 0 days    Time spent: 35 minutes    Pennie Banter, DO [facility]   If 7PM-7AM, please contact night-coverage [email or web address] Password [facility] 05/25/2019, 12:08 PM

## 2019-05-26 ENCOUNTER — Inpatient Hospital Stay: Payer: HRSA Program

## 2019-05-26 ENCOUNTER — Encounter: Payer: Self-pay | Admitting: Internal Medicine

## 2019-05-26 LAB — COMPREHENSIVE METABOLIC PANEL
ALT: 38 U/L (ref 0–44)
AST: 34 U/L (ref 15–41)
Albumin: 3.2 g/dL — ABNORMAL LOW (ref 3.5–5.0)
Alkaline Phosphatase: 59 U/L (ref 38–126)
Anion gap: 10 (ref 5–15)
BUN: 21 mg/dL — ABNORMAL HIGH (ref 6–20)
CO2: 23 mmol/L (ref 22–32)
Calcium: 7.4 mg/dL — ABNORMAL LOW (ref 8.9–10.3)
Chloride: 99 mmol/L (ref 98–111)
Creatinine, Ser: 1.09 mg/dL (ref 0.61–1.24)
GFR calc Af Amer: >60 mL/min (ref >60)
GFR calc non Af Amer: >60 mL/min (ref >60)
Glucose, Bld: 273 mg/dL — ABNORMAL HIGH (ref 70–99)
Potassium: 4.1 mmol/L (ref 3.5–5.1)
Sodium: 132 mmol/L — ABNORMAL LOW (ref 135–145)
Total Bilirubin: 0.8 mg/dL (ref 0.3–1.2)
Total Protein: 7 g/dL (ref 6.5–8.1)

## 2019-05-26 LAB — CBC WITH DIFFERENTIAL/PLATELET
Abs Immature Granulocytes: 0.03 10*3/uL (ref 0.00–0.07)
Basophils Absolute: 0 10*3/uL (ref 0.0–0.1)
Basophils Relative: 0 %
Eosinophils Absolute: 0 10*3/uL (ref 0.0–0.5)
Eosinophils Relative: 0 %
HCT: 40 % (ref 39.0–52.0)
Hemoglobin: 12.7 g/dL — ABNORMAL LOW (ref 13.0–17.0)
Immature Granulocytes: 1 %
Lymphocytes Relative: 38 %
Lymphs Abs: 1.7 10*3/uL (ref 0.7–4.0)
MCH: 29.1 pg (ref 26.0–34.0)
MCHC: 31.8 g/dL (ref 30.0–36.0)
MCV: 91.5 fL (ref 80.0–100.0)
Monocytes Absolute: 0.4 10*3/uL (ref 0.1–1.0)
Monocytes Relative: 8 %
Neutro Abs: 2.4 10*3/uL (ref 1.7–7.7)
Neutrophils Relative %: 53 %
Platelets: 274 10*3/uL (ref 150–400)
RBC: 4.37 MIL/uL (ref 4.22–5.81)
RDW: 13.1 % (ref 11.5–15.5)
WBC: 4.6 10*3/uL (ref 4.0–10.5)
nRBC: 0 % (ref 0.0–0.2)

## 2019-05-26 LAB — FERRITIN: Ferritin: 224 ng/mL (ref 24–336)

## 2019-05-26 LAB — GLUCOSE, CAPILLARY
Glucose-Capillary: 260 mg/dL — ABNORMAL HIGH (ref 70–99)
Glucose-Capillary: 283 mg/dL — ABNORMAL HIGH (ref 70–99)
Glucose-Capillary: 259 mg/dL — ABNORMAL HIGH (ref 70–99)
Glucose-Capillary: 363 mg/dL — ABNORMAL HIGH (ref 70–99)

## 2019-05-26 LAB — HEMOGLOBIN A1C
Hgb A1c MFr Bld: 13.6 % — ABNORMAL HIGH (ref 4.8–5.6)
Mean Plasma Glucose: 343.62 mg/dL

## 2019-05-26 LAB — LACTIC ACID, PLASMA
Lactic Acid, Venous: 1.2 mmol/L (ref 0.5–1.9)
Lactic Acid, Venous: 1 mmol/L (ref 0.5–1.9)

## 2019-05-26 LAB — C-REACTIVE PROTEIN: CRP: 12.1 mg/dL — ABNORMAL HIGH (ref <1.0)

## 2019-05-26 LAB — FIBRIN DERIVATIVES D-DIMER (ARMC ONLY): Fibrin derivatives D-dimer (ARMC): 819.82 ng/mL (FEU) — ABNORMAL HIGH (ref 0.00–499.00)

## 2019-05-26 LAB — PROCALCITONIN: Procalcitonin: <0.1 ng/mL

## 2019-05-26 MED ORDER — INSULIN GLARGINE 100 UNIT/ML ~~LOC~~ SOLN
25.0000 [IU] | Freq: Every day | SUBCUTANEOUS | Status: DC
  Administered 2019-05-26: 22:00:00 25 [IU] via SUBCUTANEOUS
  Filled 2019-05-26 (×2): qty 0.25

## 2019-05-26 MED ORDER — VANCOMYCIN HCL 10 G IV SOLR
2500.0000 mg | Freq: Once | INTRAVENOUS | Status: AC
  Administered 2019-05-26: 2500 mg via INTRAVENOUS
  Filled 2019-05-26 (×2): qty 2500

## 2019-05-26 MED ORDER — IOHEXOL 350 MG/ML SOLN
100.0000 mL | Freq: Once | INTRAVENOUS | Status: AC | PRN
  Administered 2019-05-26: 100 mL via INTRAVENOUS

## 2019-05-26 MED ORDER — PIPERACILLIN-TAZOBACTAM 3.375 G IVPB
3.3750 g | Freq: Three times a day (TID) | INTRAVENOUS | Status: DC
  Administered 2019-05-26 (×2): 3.375 g via INTRAVENOUS
  Filled 2019-05-26 (×2): qty 50

## 2019-05-26 MED ORDER — SODIUM CHLORIDE 0.9 % IV BOLUS
1000.0000 mL | Freq: Once | INTRAVENOUS | Status: AC
  Administered 2019-05-26: 01:00:00 1000 mL via INTRAVENOUS

## 2019-05-26 MED ORDER — DEXAMETHASONE SODIUM PHOSPHATE 10 MG/ML IJ SOLN
6.0000 mg | INTRAMUSCULAR | Status: DC
  Administered 2019-05-26 – 2019-05-30 (×5): 6 mg via INTRAVENOUS
  Filled 2019-05-26 (×5): qty 1

## 2019-05-26 MED ORDER — ENOXAPARIN SODIUM 40 MG/0.4ML ~~LOC~~ SOLN
40.0000 mg | Freq: Two times a day (BID) | SUBCUTANEOUS | Status: DC
  Administered 2019-05-26 – 2019-05-30 (×9): 40 mg via SUBCUTANEOUS
  Filled 2019-05-26 (×9): qty 0.4

## 2019-05-26 MED ORDER — VANCOMYCIN HCL 1500 MG/300ML IV SOLN
1500.0000 mg | Freq: Two times a day (BID) | INTRAVENOUS | Status: DC
  Administered 2019-05-26: 14:00:00 1500 mg via INTRAVENOUS
  Filled 2019-05-26 (×2): qty 300

## 2019-05-26 MED ORDER — INSULIN ASPART 100 UNIT/ML ~~LOC~~ SOLN
15.0000 [IU] | Freq: Three times a day (TID) | SUBCUTANEOUS | Status: DC
  Administered 2019-05-26 – 2019-05-27 (×3): 15 [IU] via SUBCUTANEOUS
  Filled 2019-05-26 (×3): qty 1

## 2019-05-26 MED ORDER — PIPERACILLIN-TAZOBACTAM 3.375 G IVPB 30 MIN: 3.3750 g | Freq: Four times a day (QID) | INTRAVENOUS | Status: DC

## 2019-05-26 MED ORDER — SODIUM CHLORIDE 0.9 % IV BOLUS: 500.0000 mL | Freq: Once | INTRAVENOUS | Status: DC

## 2019-05-26 NOTE — Progress Notes (Signed)
   Vital Signs MEWS/VS Documentation      05/26/2019 0045 05/26/2019 0046 05/26/2019 0047 05/26/2019 0100   MEWS Score:  6  8  8  6    MEWS Score Color:  Red  Red  Red  Red   Resp:  (!) 21  (!) 31  (!) 31  (!) 22   Pulse:  (!) 117  --  (!) 117  (!) 119   BP:  (!) 70/30  --  --  (!) 79/34   Temp:  (!) 101.4 F (38.6 C)  --  --  --   O2 Device:  Nasal Cannula  --  Nasal Cannula  Nasal Cannula   O2 Flow Rate (L/min):  6 L/min  --  6 L/min  6 L/min   Level of Consciousness:  Alert  --  --  Alert       Notified NP-of RED mews-increased oxygen to 6L, given 1L bolus, continue to monitor   Manuela Schwartz 05/26/2019,1:13 AM

## 2019-05-26 NOTE — Progress Notes (Signed)
PROGRESS NOTE    [name]  EHM:[id number] DOB: 05-06-1976 DOA: 05/24/2019  PCP: [facility]    LOS - 1   Brief Narrative:  43 y.o.malewith medical history ofinsulin-dependent type 2 diabetes, lumbar stenosis with neurogenic claudication, and morbid obesity who presented to ED on 1/15 with body aches, progressive shortness of breath for past 3 days.  In the ED, febrile 100.5, tachycardic up to 120, no hypoxia.  Labs notable for no leukocytosis, negative procalcitonin, negative troponin and BNP.  Covid-19 antigen positive.  Chest x-ray showed right middle lobe pneumonia.  Admitted to hospitalist service, treating with remdesivir, vitamin C, zinc.  Patient with persistent fevers, and did develop hypoxia on 1/16, initially on 2-3 L/min, but up to 6 L/min today with ongoing tachycardia and tachypnea.  Subjective 1/17: Patient seen this AM.  Overnight, patient had worsening hypoxia.  CTA chest was obtained, ruled out PE, Vanc and Zosyn were started empirically.  Did have a fever 100.5 around 4AM this morning.  Patient reports not feeling well.  Has been sleeping and laying on his side for the most part.  Back and hip pain are better today in regular hospital bed.  We discussed any risk factors for HIV, given reactive screening test.  No history of IV drug use.  States he and wife were virgins when married and neither have been with anyone else.  Advised patient we will have more information soon.    Assessment & Plan:   Principal Problem:   COVID-19 virus infection Active Problems:   Morbid obesity ([facility])   Type 2 diabetes mellitus without complication, with long-term current use of insulin ([facility])   Essential hypertension   Dyslipidemia  Acute Hypoxic Respiratory failure due to Covid-19 COVID-19 infection Myalgias secondary to above Generalized weakness secondary to above --Continue remdesivir --Monitor inflammatory markers --No steroid given the  hypoxia --Continuous pulse ox  --Vitamin C and zinc --Aspirin --CRP is still pending --Monitor inflammatory markers --stopped vanc/zosyn as procal negative, this is all covid  Positive HIV screen - patient confirmed no prior diagnosis of HIV.  He denies risk factors including history of IV drug use or unprotected sex with males or females.  Wife and patient have been monogamous since marriage (both were virgins). --CD4 count and viral load - pending --Await confirmatory result  Type 2 diabetes with hyperglycemia Takes 60 units of Soliqua at home.  BG uncontrolled at 268 on admission. --Increased Lantus to 25 units --Increase NovoLog 15 units with meals --Continue moderate sliding scale NovoLog --Titrate insulin as needed for glucose 140-180 --A1c with morning labs  Morbid obesity BMI > 40 Complicates comorbidities  hypertension continue Lasix and lisinopril   Hyperlipidemia continue statin   DVT prophylaxis: Lovenox   Code Status: Full Code  Family Communication: None at bedside Disposition Plan: Pending clinical improvement, likely at least a few days.  Expect d/c home.   Objective: Vitals:   05/26/19 0224 05/26/19 0300 05/26/19 0412 05/26/19 0500  BP: 106/63 106/67 104/66 (!) 102/58  Pulse: (!) 114 (!) 112  (!) 113  Resp: (!) 32 (!) 30 (!) 32 (!) 32  Temp: 100.2 F (37.9 C) 99.5 F (37.5 C) (!) 100.5 F (38.1 C) 99.7 F (37.6 C)  TempSrc: Oral Oral Oral Oral  SpO2: 96% 97% 96% 98%  Weight:      Height:        Intake/Output Summary (Last 24 hours) at 05/26/2019 0725 Last data filed at 05/26/2019 0500 Gross per  24 hour  Intake 1008.49 ml  Output 1825 ml  Net -816.51 ml   Filed Weights   05/24/19 1622  Weight: (!) 154.2 kg    Examination:  General exam: awake, alert, no acute distress, obese, ill appearing HEENT: moist mucus membranes, hearing grossly normal  Respiratory system: basilar crackles bilaterally, no wheezes or rhonchi, normal  respiratory effort. On 6 L/min nasal cannula Cardiovascular system: normal S1/S2, RRR, no pedal edema.   Central nervous system: alert and oriented x4. no gross focal neurologic deficits, normal speech Extremities: moves all, no edema, normal tone Skin: dry, intact, normal temperature Psychiatry: normal mood, congruent affect, judgement and insight appear normal    Data Reviewed: I have personally reviewed following labs and imaging studies  CBC: Recent Labs  Lab 05/24/19 1626 05/25/19 0302 05/26/19 0435  WBC 4.9 4.4 4.6  NEUTROABS 3.0 2.8 2.4  HGB 13.9 13.1 12.7*  HCT 43.1 41.3 40.0  MCV 90.0 91.2 91.5  PLT 260 248 274   Basic Metabolic Panel: Recent Labs  Lab 05/24/19 1626 05/25/19 0302 05/26/19 0435  NA 135 133* 132*  K 4.1 4.5 4.1  CL 101 99 99  CO2 20* 23 23  GLUCOSE 268* 464* 273*  BUN 14 20 21*  CREATININE 0.94 1.28* 1.09  CALCIUM 8.1* 7.9* 7.4*   GFR: Estimated Creatinine Clearance: 134.4 mL/min (by C-G formula based on SCr of 1.09 mg/dL). Liver Function Tests: Recent Labs  Lab 05/24/19 1626 05/25/19 0302 05/26/19 0435  AST 35 34 34  ALT 46* 40 38  ALKPHOS 78 73 59  BILITOT 1.1 0.7 0.8  PROT 7.9 7.3 7.0  ALBUMIN 4.0 3.8 3.2*   No results for input(s): LIPASE, AMYLASE in the last 168 hours. No results for input(s): AMMONIA in the last 168 hours. Coagulation Profile: No results for input(s): INR, PROTIME in the last 168 hours. Cardiac Enzymes: No results for input(s): CKTOTAL, CKMB, CKMBINDEX, TROPONINI in the last 168 hours. BNP (last 3 results) No results for input(s): PROBNP in the last 8760 hours. HbA1C: No results for input(s): HGBA1C in the last 72 hours. CBG: Recent Labs  Lab 05/25/19 0755 05/25/19 1234 05/25/19 1819 05/25/19 2238  GLUCAP 385* 277* 192* 267*   Lipid Profile: No results for input(s): CHOL, HDL, LDLCALC, TRIG, CHOLHDL, LDLDIRECT in the last 72 hours. Thyroid Function Tests: No results for input(s): TSH, T4TOTAL,  FREET4, T3FREE, THYROIDAB in the last 72 hours. Anemia Panel: Recent Labs    05/25/19 0302 05/26/19 0435  FERRITIN 203 224   Sepsis Labs: Recent Labs  Lab 05/24/19 1808 05/26/19 0119 05/26/19 0435  PROCALCITON <0.10  --   --   LATICACIDVEN  --  1.2 1.0    Recent Results (from the past 240 hour(s))  CULTURE, BLOOD (ROUTINE X 2) w Reflex to ID Panel     Status: None (Preliminary result)   Collection Time: 05/26/19  1:19 AM   Specimen: BLOOD  Result Value Ref Range Status   Specimen Description BLOOD LEFT ANTECUBITAL  Final   Special Requests   Final    BOTTLES DRAWN AEROBIC AND ANAEROBIC Blood Culture adequate volume   Culture   Final    NO GROWTH < 12 HOURS Performed at [facility], [address] [place], [place] [address]    Report Status PENDING  Incomplete  CULTURE, BLOOD (ROUTINE X 2) w Reflex to ID Panel     Status: None (Preliminary result)   Collection Time: 05/26/19  1:19 AM  Specimen: BLOOD  Result Value Ref Range Status   Specimen Description BLOOD LEFT HAND  Final   Special Requests   Final    BOTTLES DRAWN AEROBIC AND ANAEROBIC Blood Culture adequate volume   Culture   Final    NO GROWTH < 12 HOURS Performed at [facility], [address] [place], [place] [address]    Report Status PENDING  Incomplete         Radiology Studies: DG Chest 2 View  Result Date: 05/24/2019 CLINICAL DATA:  Cough, shortness of breath and fever since Monday EXAM: CHEST - 2 VIEW COMPARISON:  Radiograph 04/30/2016 FINDINGS: Consolidation present in the right middle lobe partially silhouetting portion of the right heart border. No pneumothorax. No effusion. No acute osseous or soft tissue abnormality. Degenerative changes are present in the imaged spine and shoulders. IMPRESSION: Right middle lobe pneumonia. Follow-up to resolution is recommended. Electronically Signed   By: Lovena Le M.D.   On: 05/24/2019 17:20   CT ANGIO CHEST PE W OR WO  CONTRAST  Result Date: 05/26/2019 CLINICAL DATA:  COVID positive.  Respiratory distress EXAM: CT ANGIOGRAPHY CHEST WITH CONTRAST TECHNIQUE: Multidetector CT imaging of the chest was performed using the standard protocol during bolus administration of intravenous contrast. Multiplanar CT image reconstructions and MIPs were obtained to evaluate the vascular anatomy. CONTRAST:  135mL OMNIPAQUE IOHEXOL 350 MG/ML SOLN COMPARISON:  Radiograph 05/24/2019 FINDINGS: Cardiovascular: Evaluation the pulmonary arteries is somewhat limited by dispersion of the contrast bolus and body habitus as well as some respiratory motion. With this caveat, there are no filling defects within the pulmonary arteries to suggest acute pulmonary embolism. Mediastinum/Nodes: Trachea and esophagus normal. Lungs/Pleura: There is widespread bilateral nodular airspace disease in the upper and lower lobes. A focus consolidation in the RIGHT middle lobe below the horizontal fissure. Mild consolidation in the RIGHT upper lobe. No pleural fluid. Upper Abdomen: Limited view of the liver, kidneys, pancreas are unremarkable. Normal adrenal glands. Musculoskeletal: No aggressive osseous lesion. Review of the MIP images confirms the above findings. IMPRESSION: 1. No evidence acute pulmonary embolism with some limitation as described above. 2. Bilateral widespread nodular airspace disease consistent with COVID viral pneumonia. Mild consolidation in the RIGHT lung. Electronically Signed   By: Suzy Bouchard M.D.   On: 05/26/2019 04:26        Scheduled Meds: . aspirin EC  81 mg Oral Daily  . enoxaparin (LOVENOX) injection  40 mg Subcutaneous Q12H  . furosemide  40 mg Oral Daily  . insulin aspart  0-15 Units Subcutaneous TID WC  . insulin aspart  10 Units Subcutaneous TID WC  . insulin glargine  15 Units Subcutaneous QHS  . lisinopril  20 mg Oral Daily  . simvastatin  10 mg Oral Daily  . zinc sulfate  220 mg Oral Daily   Continuous  Infusions: . piperacillin-tazobactam (ZOSYN)  IV 12.5 mL/hr at 05/26/19 0523  . remdesivir 100 mg in NS 100 mL    . vancomycin       LOS: 1 day    Time spent: 35 minutes    Ezekiel Slocumb, DO [facility]   If 7PM-7AM, please contact night-coverage [email or web address] Password [facility] 05/26/2019, 7:25 AM

## 2019-05-26 NOTE — Progress Notes (Signed)
Nurse called earlier with NEWS score, Noted patient with hypotension, tachycardia, increased respiratory rate and increased oxygen.  Noted previous consolidation in right lung on chest film reported earlier. Also elevated d dimer. Discussed patient with admitting physician who agreed CTA for further evaluation warranted. Results  IMPRESSION: 1. No evidence acute pulmonary embolism with some limitation as described above. 2. Bilateral widespread nodular airspace disease consistent with COVID viral pneumonia. Mild consolidation in the RIGHT lung.  Blood cultures were collected, lactic acid was normal and blood pressure improved after 1 liter of NS.  Patent was started on broad spectrum antibiotics  Bedside patient states he is feeling better. BP and hear rate have improved. Discussed CT results and antibiotic therapies ordered.  Discussed benefits of prone/semiprone positioning'

## 2019-05-26 NOTE — Progress Notes (Signed)
Pharmacy Antibiotic Note  [name] is a 44 y.o. male admitted on 05/24/2019 with sepsis.  Pharmacy has been consulted for vancomycin dosing. Patient admitted 2 nights ago w/ body aches found to be positive for COVID pneumonia started on remdesivir; however, last night patient continued to decline clinically and vancomycin/zosyn ordered for broad coverage in setting of suspected superimposed bacterial infx.  Plan: Patient received vanc 2.5g IV load  Vancomycin 1500 mg IV Q 12 hrs. Goal AUC 400-550. Expected AUC: 447.1 SCr used: 1.09 Cssmin: 12.0  Patient also started on zosyn 3.375g IV q8h (although patient has a large body habitus and may benefit from a larger zosyn dose). Will continue to monitor and adjust doses as necessary.  Height: 6\' 2"  (188 cm) Weight: (!) 340 lb (154.2 kg) IBW/kg (Calculated) : 82.2  Temp (24hrs), Avg:100.9 F (38.3 C), Min:99.5 F (37.5 C), Max:102.6 F (39.2 C)  Recent Labs  Lab 05/24/19 1626 05/25/19 0302 05/26/19 0119 05/26/19 0435  WBC 4.9 4.4  --  4.6  CREATININE 0.94 1.28*  --  1.09  LATICACIDVEN  --   --  1.2 1.0    Estimated Creatinine Clearance: 134.4 mL/min (by C-G formula based on SCr of 1.09 mg/dL).    Allergies  Allergen Reactions  . Bee Venom Anaphylaxis, Swelling and Other (See Comments)    Swelling primarily at sting site, but can be all over.  Epi pen prn    Thank you for allowing pharmacy to be a part of this patient's care.  05/28/19, PharmD, BCPS Clinical Pharmacist 05/26/2019 6:47 AM

## 2019-05-26 NOTE — Progress Notes (Signed)
Np, [name], at resident bedside for assessment. No new verbal orders given a this time. Continue to monitor per MEWS protocol.

## 2019-05-26 NOTE — Progress Notes (Deleted)
Received MD order to discourage patient to home reviewed discharge instructions, home meds, prescriptions and follow up appointments with patient and patient verbalized understanding

## 2019-05-27 ENCOUNTER — Inpatient Hospital Stay
Admission: AD | Admit: 2019-05-27 | Payer: Self-pay | Source: Other Acute Inpatient Hospital | Admitting: Internal Medicine

## 2019-05-27 LAB — CBC WITH DIFFERENTIAL/PLATELET
Abs Immature Granulocytes: 0.03 10*3/uL (ref 0.00–0.07)
Basophils Absolute: 0 10*3/uL (ref 0.0–0.1)
Basophils Relative: 0 %
Eosinophils Absolute: 0 10*3/uL (ref 0.0–0.5)
Eosinophils Relative: 0 %
HCT: 39.7 % (ref 39.0–52.0)
Hemoglobin: 12.4 g/dL — ABNORMAL LOW (ref 13.0–17.0)
Immature Granulocytes: 1 %
Lymphocytes Relative: 32 %
Lymphs Abs: 1.4 10*3/uL (ref 0.7–4.0)
MCH: 28.4 pg (ref 26.0–34.0)
MCHC: 31.2 g/dL (ref 30.0–36.0)
MCV: 90.8 fL (ref 80.0–100.0)
Monocytes Absolute: 0.4 10*3/uL (ref 0.1–1.0)
Monocytes Relative: 10 %
Neutro Abs: 2.5 10*3/uL (ref 1.7–7.7)
Neutrophils Relative %: 57 %
Platelets: 309 10*3/uL (ref 150–400)
RBC: 4.37 MIL/uL (ref 4.22–5.81)
RDW: 12.9 % (ref 11.5–15.5)
Smear Review: NORMAL
WBC Morphology: ABNORMAL
WBC: 4.4 10*3/uL (ref 4.0–10.5)
nRBC: 0 % (ref 0.0–0.2)

## 2019-05-27 LAB — COMPREHENSIVE METABOLIC PANEL
ALT: 33 U/L (ref 0–44)
AST: 26 U/L (ref 15–41)
Albumin: 3.2 g/dL — ABNORMAL LOW (ref 3.5–5.0)
Alkaline Phosphatase: 57 U/L (ref 38–126)
Anion gap: 11 (ref 5–15)
BUN: 22 mg/dL — ABNORMAL HIGH (ref 6–20)
CO2: 24 mmol/L (ref 22–32)
Calcium: 7.9 mg/dL — ABNORMAL LOW (ref 8.9–10.3)
Chloride: 102 mmol/L (ref 98–111)
Creatinine, Ser: 1.09 mg/dL (ref 0.61–1.24)
GFR calc Af Amer: >60 mL/min (ref >60)
GFR calc non Af Amer: >60 mL/min (ref >60)
Glucose, Bld: 310 mg/dL — ABNORMAL HIGH (ref 70–99)
Potassium: 4.5 mmol/L (ref 3.5–5.1)
Sodium: 137 mmol/L (ref 135–145)
Total Bilirubin: 0.7 mg/dL (ref 0.3–1.2)
Total Protein: 7.2 g/dL (ref 6.5–8.1)

## 2019-05-27 LAB — GLUCOSE, CAPILLARY
Glucose-Capillary: 291 mg/dL — ABNORMAL HIGH (ref 70–99)
Glucose-Capillary: 362 mg/dL — ABNORMAL HIGH (ref 70–99)
Glucose-Capillary: 291 mg/dL — ABNORMAL HIGH (ref 70–99)
Glucose-Capillary: 353 mg/dL — ABNORMAL HIGH (ref 70–99)

## 2019-05-27 LAB — PROCALCITONIN: Procalcitonin: <0.1 ng/mL

## 2019-05-27 LAB — FIBRIN DERIVATIVES D-DIMER (ARMC ONLY): Fibrin derivatives D-dimer (ARMC): 693.37 ng/mL (FEU) — ABNORMAL HIGH (ref 0.00–499.00)

## 2019-05-27 LAB — PANEL 083904
HIV 1 AB: NEGATIVE
HIV 2 AB: NEGATIVE
Note: NEGATIVE

## 2019-05-27 LAB — FERRITIN: Ferritin: 243 ng/mL (ref 24–336)

## 2019-05-27 LAB — C-REACTIVE PROTEIN: CRP: 12.9 mg/dL — ABNORMAL HIGH (ref <1.0)

## 2019-05-27 MED ORDER — INSULIN ASPART 100 UNIT/ML ~~LOC~~ SOLN
22.0000 [IU] | Freq: Three times a day (TID) | SUBCUTANEOUS | Status: DC
  Administered 2019-05-27 – 2019-05-28 (×2): 22 [IU] via SUBCUTANEOUS
  Filled 2019-05-27 (×2): qty 1

## 2019-05-27 MED ORDER — INSULIN GLARGINE 100 UNIT/ML ~~LOC~~ SOLN
35.0000 [IU] | Freq: Every day | SUBCUTANEOUS | Status: DC
  Administered 2019-05-27: 22:00:00 35 [IU] via SUBCUTANEOUS
  Filled 2019-05-27 (×2): qty 0.35

## 2019-05-27 NOTE — Progress Notes (Addendum)
PROGRESS NOTE DISCHARGE SUMMARY for Transfer to [name]  [facility]MC:[id number] DOB: [birth year] DOA: 05/24/2019  PCP: [name]    LOS - 2   Brief Narrative:  43 y.o.malewith medical history ofinsulin-dependent type 2 diabetes, lumbar stenosis with neurogenic claudication, and morbid obesity who presented to ED on 1/15 withbody aches, progressiveshortness of breathfor past 3 days.In the ED, febrile 100.5, tachycardic up to 120, no hypoxia. Labs notable for no leukocytosis, negative procalcitonin, negative troponin and BNP. Covid-19 antigen positive. Chest x-ray showed right middle lobe pneumonia.Admitted to hospitalist service, treating with remdesivir, vitamin C, zinc.  Patient with persistent fevers, and did develop hypoxia on 1/16, initially on 2-3 L/min, but up to 6 L/min past couple days with ongoing tachycardia and tachypnea.  Subjective 1/18: Patient seen this AM, wife on video call during encounter.  Discussed transfer to [facility], they are agreeable.  Patient reports he slept all night.  Takes shallow breaths because deeper inspirations provoke coughing.  Using incentive spirometer and flutter valve.  No fever/chills.  No acute events reported.    Assessment & Plan:   Principal Problem:   COVID-19 virus infection Active Problems:   Morbid obesity ([name])   Type 2 diabetes mellitus without complication, with long-term current use of insulin ([name])   Essential hypertension   Dyslipidemia   Acute Hypoxic Respiratory failure due to Covid-19 COVID-19 infection Myalgias secondary to above Generalized weakness secondary to above --Continue remdesivir, started 1/16 --Continue Decadron, started 1/17 --Monitor inflammatory markers --Continuous pulse ox --Vitamin C and zinc --Aspirin --Monitor inflammatory markers  Positive HIV screen- patient confirmed no prior diagnosis of HIV.  He denies risk factors including history of IV drug use or  unprotected sex with males or females.  Wife and patient have been monogamous since marriage (both were virgins). --CD4 count and viral load - pending --Await confirmatory result --will need referral to ID if confirmed positive  Type 2 diabeteswith hyperglycemia Takes60 units of Soliquaat home.BGuncontrolled at268 onadmission. --IncreasedLantus to 35units --Increase NovoLog 22 units with meals --Continue moderate sliding scale NovoLog --Titrate insulin as needed for glucose 140-180 --A1c with morning labs  Morbid obesity BMI > 40 --Complicates comorbidities  Essential Hypertension --continue Lasix and lisinopril   Hyperlipidemia --continue statin   DVT prophylaxis:Lovenox Code Status: Full Code Family Communication:Wife on face time during encounter today, updated and all questions answered. Disposition Plan:Pending clinical improvement, likely at least a few days.  Expect he will d/c home.  Transferring to [facility].   Objective: Vitals:   05/26/19 2200 05/27/19 0000 05/27/19 0120 05/27/19 0700  BP:      Pulse:  98  95  Resp:  (!) 30    Temp: 99 F (37.2 C) 98.4 F (36.9 C) 98.1 F (36.7 C)   TempSrc: Oral     SpO2:  94%    Weight:      Height:        Intake/Output Summary (Last 24 hours) at 05/27/2019 0723 Last data filed at 05/26/2019 2246 Gross per 24 hour  Intake 720 ml  Output 2026 ml  Net -1306 ml   Filed Weights   05/24/19 1622  Weight: (!) 154.2 kg    Examination:  General exam: awake, alert, no acute distress, obese HEENT: moist mucus membranes, hearing grossly normal  Respiratory system: diminished breath sounds, no wheezes or rhonchi, normal respiratory effort. Cardiovascular system: normal S1/S2, RRR, no pedal edema.   Central nervous system: alert and oriented x4. no  gross focal neurologic deficits, normal speech Extremities: moves all, no edema, normal tone Skin: dry, intact, normal temperature Psychiatry: normal mood,  congruent affect, judgement and insight appear normal    Data Reviewed: I have personally reviewed following labs and imaging studies  CBC: Recent Labs  Lab 05/24/19 1626 05/25/19 0302 05/26/19 0435  WBC 4.9 4.4 4.6  NEUTROABS 3.0 2.8 2.4  HGB 13.9 13.1 12.7*  HCT 43.1 41.3 40.0  MCV 90.0 91.2 91.5  PLT 260 248 274   Basic Metabolic Panel: Recent Labs  Lab 05/24/19 1626 05/25/19 0302 05/26/19 0435  NA 135 133* 132*  K 4.1 4.5 4.1  CL 101 99 99  CO2 20* 23 23  GLUCOSE 268* 464* 273*  BUN 14 20 21*  CREATININE 0.94 1.28* 1.09  CALCIUM 8.1* 7.9* 7.4*   GFR: Estimated Creatinine Clearance: 134.4 mL/min (by C-G formula based on SCr of 1.09 mg/dL). Liver Function Tests: Recent Labs  Lab 05/24/19 1626 05/25/19 0302 05/26/19 0435  AST 35 34 34  ALT 46* 40 38  ALKPHOS 78 73 59  BILITOT 1.1 0.7 0.8  PROT 7.9 7.3 7.0  ALBUMIN 4.0 3.8 3.2*   No results for input(s): LIPASE, AMYLASE in the last 168 hours. No results for input(s): AMMONIA in the last 168 hours. Coagulation Profile: No results for input(s): INR, PROTIME in the last 168 hours. Cardiac Enzymes: No results for input(s): CKTOTAL, CKMB, CKMBINDEX, TROPONINI in the last 168 hours. BNP (last 3 results) No results for input(s): PROBNP in the last 8760 hours. HbA1C: Recent Labs    05/26/19 0435  HGBA1C 13.6*   CBG: Recent Labs  Lab 05/25/19 2238 05/26/19 0749 05/26/19 1135 05/26/19 1629 05/26/19 2112  GLUCAP 267* 260* 283* 259* 363*   Lipid Profile: No results for input(s): CHOL, HDL, LDLCALC, TRIG, CHOLHDL, LDLDIRECT in the last 72 hours. Thyroid Function Tests: No results for input(s): TSH, T4TOTAL, FREET4, T3FREE, THYROIDAB in the last 72 hours. Anemia Panel: Recent Labs    05/25/19 0302 05/26/19 0435  FERRITIN 203 224   Sepsis Labs: Recent Labs  Lab 05/24/19 1808 05/26/19 0119 05/26/19 0406 05/26/19 0435  PROCALCITON <0.10  --  <0.10  --   LATICACIDVEN  --  1.2  --  1.0     Recent Results (from the past 240 hour(s))  CULTURE, BLOOD (ROUTINE X 2) w Reflex to ID Panel     Status: None (Preliminary result)   Collection Time: 05/26/19  1:19 AM   Specimen: BLOOD  Result Value Ref Range Status   Specimen Description BLOOD LEFT ANTECUBITAL  Final   Special Requests   Final    BOTTLES DRAWN AEROBIC AND ANAEROBIC Blood Culture adequate volume   Culture   Final    NO GROWTH < 12 HOURS Performed at [facility], [address], [place], [place] [address]    Report Status PENDING  Incomplete  CULTURE, BLOOD (ROUTINE X 2) w Reflex to ID Panel     Status: None (Preliminary result)   Collection Time: 05/26/19  1:19 AM   Specimen: BLOOD  Result Value Ref Range Status   Specimen Description BLOOD LEFT HAND  Final   Special Requests   Final    BOTTLES DRAWN AEROBIC AND ANAEROBIC Blood Culture adequate volume   Culture   Final    NO GROWTH < 12 HOURS Performed at [facility], [address] [place], [place] [address]    Report Status PENDING  Incomplete  Radiology Studies: CT ANGIO CHEST PE W OR WO CONTRAST  Result Date: 05/26/2019 CLINICAL DATA:  COVID positive.  Respiratory distress EXAM: CT ANGIOGRAPHY CHEST WITH CONTRAST TECHNIQUE: Multidetector CT imaging of the chest was performed using the standard protocol during bolus administration of intravenous contrast. Multiplanar CT image reconstructions and MIPs were obtained to evaluate the vascular anatomy. CONTRAST:  OMNIPAQUE IOHEXOL 350 MG/ML SOLN COMPARISON:  Radiograph 05/24/2019 FINDINGS: Cardiovascular: Evaluation the pulmonary arteries is somewhat limited by dispersion of the contrast bolus and body habitus as well as some respiratory motion. With this caveat, there are no filling defects within the pulmonary arteries to suggest acute pulmonary embolism. Mediastinum/Nodes: Trachea and esophagus normal. Lungs/Pleura: There is widespread bilateral nodular airspace disease  in the upper and lower lobes. A focus consolidation in the RIGHT middle lobe below the horizontal fissure. Mild consolidation in the RIGHT upper lobe. No pleural fluid. Upper Abdomen: Limited view of the liver, kidneys, pancreas are unremarkable. Normal adrenal glands. Musculoskeletal: No aggressive osseous lesion. Review of the MIP images confirms the above findings. IMPRESSION: 1. No evidence acute pulmonary embolism with some limitation as described above. 2. Bilateral widespread nodular airspace disease consistent with COVID viral pneumonia. Mild consolidation in the RIGHT lung. Electronically Signed   By: Genevive Bi M.D.   On: 05/26/2019 04:26        Scheduled Meds: . aspirin EC  81 mg Oral Daily  . dexamethasone (DECADRON) injection  6 mg Intravenous Q24H  . enoxaparin (LOVENOX) injection  40 mg Subcutaneous Q12H  . furosemide  40 mg Oral Daily  . insulin aspart  0-15 Units Subcutaneous TID WC  . insulin aspart  15 Units Subcutaneous TID WC  . insulin glargine  25 Units Subcutaneous QHS  . lisinopril  20 mg Oral Daily  . simvastatin  10 mg Oral Daily  . zinc sulfate  220 mg Oral Daily   Continuous Infusions: . remdesivir 100 mg in NS 100 mL 100 mg (05/26/19 0949)     LOS: 2 days    Time spent: 30-35 minutes    Pennie Banter, DO [facility]   If 7PM-7AM, please contact night-coverage [email or web address] Password [facility] 05/27/2019, 7:23 AM

## 2019-05-27 NOTE — Progress Notes (Signed)
Patient was informed that [facility] has a bed ready for him and they are prepared to transfer him. Patient stated that he had told day shift nurse that he wants to stay at [facility] and does not want to go to [facility]. Marisa Cyphers., NP notified of resident refusing transfer and NP stated that it may not be in patient best interest to be transferred at this time and for patient request to be respected. [facility] notified that patient will not be transferring per his request and will be remaining at [facility].

## 2019-05-27 NOTE — Progress Notes (Signed)
Inpatient Diabetes Program Recommendations  AACE/ADA: New Consensus Statement on Inpatient Glycemic Control   Target Ranges:  Prepandial:   less than 140 mg/dL      Peak postprandial:   less than 180 mg/dL (1-2 hours)      Critically ill patients:  140 - 180 mg/dL   Results for [name], [name] (MRN [id number]) as of 05/27/2019 09:24  Ref. Range 05/26/2019 07:49 05/26/2019 11:35 05/26/2019 16:29 05/26/2019 21:12 05/27/2019 08:03  Glucose-Capillary Latest Ref Range: 70 - 99 mg/dL 650 (H) 354 (H) 656 (H) 363 (H) 291 (H)  Results for [name], [name] (MRN [id number]) as of 05/27/2019 09:24  Ref. Range 05/26/2019 04:35  Hemoglobin A1C Latest Ref Range: 4.8 - 5.6 % 13.6 (H)   Review of Glycemic Control  Diabetes history: DM2 Outpatient Diabetes medications: Soliqua 100-33 60 units daily, Metformin 1000 mg BID Current orders for Inpatient glycemic control: Lantus 25 units QHS, Novolog 15 units TID with meals, Novolog 0-15 units TID with meals; Decadron 6 mg Q24H  Inpatient Diabetes Program Recommendations:   Insulin - Basal: Fasting glucose 291 mg/dl. If steroids are continued, please consider increasing Lantus to 35 units QHS.  Insulin-Meal Coverage: If steroids are continued, please consider increasing meal coverage to Novolog 20 units TID with meals if patient eats at least 50% of meals.  Insulin-Correction: Please consider adding Novolog 0-5 units QHS for bedtime correction.  HgbA1C: A1C 13.6% on 05/26/19 indicating an average glucose of 344 mg/dl over the past 2-3 months. Prior A1C noted in Care Everywhere 9.9% on 03/08/19.  NOTE: In reviewing chart, noted patient had office visit with Dr. Chalmers Guest at [facility] on 05/24/19 and per note, patient has been out of insulin for 60 days. Per chart, patient does not have any insurance. Placed consult for TOC to assist with medication and follow up if needed. Will plan to speak with patient regarding A1C and medication needs.  Thanks, Orlando Penner, RN,  MSN, CDE Diabetes Coordinator Inpatient Diabetes Program [phone] (Team Pager from 8am to 5pm)

## 2019-05-28 LAB — COMPREHENSIVE METABOLIC PANEL
ALT: 32 U/L (ref 0–44)
AST: 26 U/L (ref 15–41)
Albumin: 3.2 g/dL — ABNORMAL LOW (ref 3.5–5.0)
Alkaline Phosphatase: 60 U/L (ref 38–126)
Anion gap: 11 (ref 5–15)
BUN: 26 mg/dL — ABNORMAL HIGH (ref 6–20)
CO2: 26 mmol/L (ref 22–32)
Calcium: 8.2 mg/dL — ABNORMAL LOW (ref 8.9–10.3)
Chloride: 101 mmol/L (ref 98–111)
Creatinine, Ser: 1.04 mg/dL (ref 0.61–1.24)
GFR calc Af Amer: >60 mL/min (ref >60)
GFR calc non Af Amer: >60 mL/min (ref >60)
Glucose, Bld: 363 mg/dL — ABNORMAL HIGH (ref 70–99)
Potassium: 4.6 mmol/L (ref 3.5–5.1)
Sodium: 138 mmol/L (ref 135–145)
Total Bilirubin: 0.7 mg/dL (ref 0.3–1.2)
Total Protein: 7.2 g/dL (ref 6.5–8.1)

## 2019-05-28 LAB — CBC WITH DIFFERENTIAL/PLATELET
Abs Immature Granulocytes: 0.03 10*3/uL (ref 0.00–0.07)
Basophils Absolute: 0 10*3/uL (ref 0.0–0.1)
Basophils Relative: 0 %
Eosinophils Absolute: 0 10*3/uL (ref 0.0–0.5)
Eosinophils Relative: 0 %
HCT: 38.8 % — ABNORMAL LOW (ref 39.0–52.0)
Hemoglobin: 12.2 g/dL — ABNORMAL LOW (ref 13.0–17.0)
Immature Granulocytes: 1 %
Lymphocytes Relative: 28 %
Lymphs Abs: 1.5 10*3/uL (ref 0.7–4.0)
MCH: 28.4 pg (ref 26.0–34.0)
MCHC: 31.4 g/dL (ref 30.0–36.0)
MCV: 90.2 fL (ref 80.0–100.0)
Monocytes Absolute: 0.5 10*3/uL (ref 0.1–1.0)
Monocytes Relative: 10 %
Neutro Abs: 3.2 10*3/uL (ref 1.7–7.7)
Neutrophils Relative %: 61 %
Platelets: 358 10*3/uL (ref 150–400)
RBC: 4.3 MIL/uL (ref 4.22–5.81)
RDW: 12.8 % (ref 11.5–15.5)
Smear Review: NORMAL
WBC: 5.2 10*3/uL (ref 4.0–10.5)
nRBC: 0 % (ref 0.0–0.2)

## 2019-05-28 LAB — HELPER T-LYMPH-CD4 (ARMC ONLY)
% CD 4 Pos. Lymph.: 55.7 % (ref 30.8–58.5)
Absolute CD 4 Helper: 947 /uL (ref 359–1519)
Basophils Absolute: 0 10*3/uL (ref 0.0–0.2)
Basos: 0 %
EOS (ABSOLUTE): 0 10*3/uL (ref 0.0–0.4)
Eos: 0 %
Hematocrit: 38.9 % (ref 37.5–51.0)
Hemoglobin: 12.9 g/dL — ABNORMAL LOW (ref 13.0–17.7)
Immature Grans (Abs): 0 10*3/uL (ref 0.0–0.1)
Immature Granulocytes: 1 %
Lymphocytes Absolute: 1.7 10*3/uL (ref 0.7–3.1)
Lymphs: 37 %
MCH: 29.5 pg (ref 26.6–33.0)
MCHC: 33.2 g/dL (ref 31.5–35.7)
MCV: 89 fL (ref 79–97)
Monocytes Absolute: 0.3 10*3/uL (ref 0.1–0.9)
Monocytes: 6 %
Neutrophils Absolute: 2.5 10*3/uL (ref 1.4–7.0)
Neutrophils: 56 %
Platelets: 295 10*3/uL (ref 150–450)
RBC: 4.38 x10E6/uL (ref 4.14–5.80)
RDW: 12.5 % (ref 11.6–15.4)
WBC: 4.5 10*3/uL (ref 3.4–10.8)

## 2019-05-28 LAB — FIBRIN DERIVATIVES D-DIMER (ARMC ONLY): Fibrin derivatives D-dimer (ARMC): 571.66 ng/mL (FEU) — ABNORMAL HIGH (ref 0.00–499.00)

## 2019-05-28 LAB — GLUCOSE, CAPILLARY
Glucose-Capillary: 290 mg/dL — ABNORMAL HIGH (ref 70–99)
Glucose-Capillary: 330 mg/dL — ABNORMAL HIGH (ref 70–99)
Glucose-Capillary: 239 mg/dL — ABNORMAL HIGH (ref 70–99)
Glucose-Capillary: 332 mg/dL — ABNORMAL HIGH (ref 70–99)

## 2019-05-28 LAB — HIV-1 RNA QUANT-NO REFLEX-BLD
HIV 1 RNA Quant: <20 copies/mL
LOG10 HIV-1 RNA: UNDETERMINED log10copy/mL

## 2019-05-28 LAB — C-REACTIVE PROTEIN: CRP: 9.1 mg/dL — ABNORMAL HIGH (ref <1.0)

## 2019-05-28 LAB — PROCALCITONIN: Procalcitonin: <0.1 ng/mL

## 2019-05-28 LAB — FERRITIN: Ferritin: 246 ng/mL (ref 24–336)

## 2019-05-28 MED ORDER — BLISTEX MEDICATED EX OINT
TOPICAL_OINTMENT | CUTANEOUS | Status: DC | PRN
  Filled 2019-05-28: qty 6.3

## 2019-05-28 MED ORDER — FAMOTIDINE 20 MG PO TABS
20.0000 mg | ORAL_TABLET | Freq: Every day | ORAL | Status: DC
  Administered 2019-05-28 – 2019-05-30 (×3): 20 mg via ORAL
  Filled 2019-05-28 (×3): qty 1

## 2019-05-28 MED ORDER — INSULIN ASPART 100 UNIT/ML ~~LOC~~ SOLN
26.0000 [IU] | Freq: Three times a day (TID) | SUBCUTANEOUS | Status: DC
  Administered 2019-05-28 – 2019-05-30 (×7): 26 [IU] via SUBCUTANEOUS
  Filled 2019-05-28 (×7): qty 1

## 2019-05-28 MED ORDER — INSULIN ASPART 100 UNIT/ML ~~LOC~~ SOLN
0.0000 [IU] | Freq: Every day | SUBCUTANEOUS | Status: DC
  Administered 2019-05-28: 5 [IU] via SUBCUTANEOUS
  Administered 2019-05-29: 4 [IU] via SUBCUTANEOUS
  Filled 2019-05-28 (×2): qty 1

## 2019-05-28 MED ORDER — INSULIN ASPART 100 UNIT/ML ~~LOC~~ SOLN
0.0000 [IU] | Freq: Three times a day (TID) | SUBCUTANEOUS | Status: DC
  Administered 2019-05-28: 17:00:00 7 [IU] via SUBCUTANEOUS
  Administered 2019-05-28: 15 [IU] via SUBCUTANEOUS
  Administered 2019-05-29 (×2): 7 [IU] via SUBCUTANEOUS
  Administered 2019-05-29: 12:00:00 11 [IU] via SUBCUTANEOUS
  Administered 2019-05-30 (×2): 7 [IU] via SUBCUTANEOUS
  Filled 2019-05-28 (×7): qty 1

## 2019-05-28 MED ORDER — INSULIN GLARGINE 100 UNIT/ML ~~LOC~~ SOLN
45.0000 [IU] | Freq: Every day | SUBCUTANEOUS | Status: DC
  Administered 2019-05-28: 22:00:00 45 [IU] via SUBCUTANEOUS
  Filled 2019-05-28 (×2): qty 0.45

## 2019-05-28 NOTE — Progress Notes (Addendum)
Inpatient Diabetes Program Recommendations  AACE/ADA: New Consensus Statement on Inpatient Glycemic Control   Target Ranges:  Prepandial:   less than 140 mg/dL      Peak postprandial:   less than 180 mg/dL (1-2 hours)      Critically ill patients:  140 - 180 mg/dL   Results for [name], [name] (MRN [id number]) as of 05/28/2019 09:00  Ref. Range 05/27/2019 08:03 05/27/2019 11:19 05/27/2019 16:25 05/27/2019 21:06 05/28/2019 07:46  Glucose-Capillary Latest Ref Range: 70 - 99 mg/dL 168 (H) 372 (H) 902 (H) 353 (H) 290 (H)   Review of Glycemic Control  Diabetes history: DM2 Outpatient Diabetes medications: Soliqua 100-33 60 units daily, Metformin 1000 mg BID Current orders for Inpatient glycemic control: Lantus 35 units QHS, Novolog 22 units TID with meals, Novolog 0-15 units TID with meals; Decadron 6 mg Q24H  Inpatient Diabetes Program Recommendations:   Insulin - Basal: Bedtime glucose 353 mg/dl last night (no correction insulin given as none ordered for HS coverage) and fasting glucose 290 mg/dl. If steroids are continued, please consider increasing Lantus to 45 units QHS.  Insulin-Meal Coverage: If steroids are continued, please consider increasing meal coverage to Novolog 26 units TID with meals if patient eats at least 50% of meals.  Insulin-Correction: Please consider adding Novolog 0-5 units QHS for bedtime correction.  HgbA1C: A1C 13.6% on 05/26/19 indicating an average glucose of 344 mg/dl over the past 2-3 months. Prior A1C noted in Care Everywhere 9.9% on 03/08/19.  Addendum 05/28/19@16 :30-Called patient's room but working with PT at this time. Will plan to call patient again tomorrow to follow up on DM control.  Thanks, Orlando Penner, RN, MSN, CDE Diabetes Coordinator Inpatient Diabetes Program [phone] (Team Pager from 8am to 5pm)

## 2019-05-28 NOTE — Progress Notes (Addendum)
PROGRESS NOTE    [name]  IWP:[id number] DOB: March 12, 1976 DOA: 05/24/2019  PCP: [facility]    LOS - 3   Brief Narrative:  43 y.o.malewith medical history ofinsulin-dependent type 2 diabetes, lumbar stenosis with neurogenic claudication, and morbid obesity who presented to ED on 1/15 withbody aches, progressiveshortness of breathfor past 3 days.In the ED, febrile 100.5, tachycardic up to 120, no hypoxia. Labs notable for no leukocytosis, negative procalcitonin, negative troponin and BNP. Covid-19 antigen positive. Chest x-ray showed right middle lobe pneumonia.Admitted to hospitalist service, treating with remdesivir, vitamin C, zinc.Patient with persistent fevers, and did develop hypoxia on 1/16, initially on 2-3 L/min, was up to 6 L/min for couple of days with ongoing tachycardia and tachypnea.  1/19 improved vitals and oxygen weaned to 3-4 L/min.    Subjective 1/19: Patient seen this morning.  Says he is feeling little better today.  Until he is not breathing is rapidly.  States he did not sleep well overnight, telemetry leads kept coming off.  No fevers or chills.  He is using incentive spirometer and flutter valve regularly.  Cough has been dry, is improving.  Assessment & Plan:   Principal Problem:   COVID-19 virus infection Active Problems:   Morbid obesity ([facility])   Type 2 diabetes mellitus without complication, with long-term current use of insulin ([facility])   Essential hypertension   Dyslipidemia   Acute Hypoxic Respiratory failure due to Covid-19 COVID-19 infection Myalgias secondary to above Generalized weakness secondary to above --Continueremdesivir, started 1/16 --Continue Decadron, started 1/17 --Monitor inflammatory markers --Continuous pulse ox --Vitamin C and zinc --Aspirin, Pepcid --Monitor inflammatory markers  Positive HIVscreen-patient confirmed no prior diagnosis of HIV. He denies risk factors including history of  IV drug use or unprotected sex with males or females other than his wife. Wife and patient have been monogamous since marriage (both were virgins). --CD4 count and viral load- pending --Await confirmatoryresult --will need referral to ID if confirmed positive  Type 2 diabeteswith hyperglycemia due to steroid Takes60 units of Soliquaat home.BGuncontrolled at268 onadmission.  A1c 13.6. --IncreasedLantus to45 units --IncreaseNovoLog  26units with meals --Continue moderate sliding scale NovoLog --Titrate insulin as needed for glucose 140-180  Morbid obesity BMI > 40 --Complicates comorbidities  Essential Hypertension --continue Lasix and lisinopril   Hyperlipidemia --continue statin   DVT prophylaxis:Lovenox Code Status: Full Code Family Communication:None at bedside Disposition Plan:Wean down oxygen, clinically improving.  Expect discharge home pending further improvement, estimate 2 more days.  Possible he may require home oxygen on discharge.  Independent at baseline but will have PT evaluate.  Consultants:   None  Procedures:   None  Antimicrobials:   None   Objective: Vitals:   05/27/19 2300 05/28/19 0706 05/28/19 0755 05/28/19 0830  BP: 93/61   113/85  Pulse: (!) 103 82 91 91  Resp: 19 20 20 20   Temp: 98.7 F (37.1 C)     TempSrc: Oral     SpO2: 94% 97% 94% 93%  Weight:      Height:        Intake/Output Summary (Last 24 hours) at 05/28/2019 1432 Last data filed at 05/28/2019 0915 Gross per 24 hour  Intake 100 ml  Output 1950 ml  Net -1850 ml   Filed Weights   05/24/19 1622  Weight: (!) 154.2 kg    Examination:  General exam: awake, alert, no acute distress, obese HEENT: clear conjunctiva, anicteric sclera, moist mucus membranes, hearing grossly normal  Respiratory system: Better  air movement, bilateral crackles at the bases, no wheezes or rhonchi, appears mildly dyspneic after going from laying to sitting up edge of  bed.  On 4 L/min oxygen nasal cannula. Cardiovascular system: normal S1/S2, RRR, no JVD, murmurs, rubs, gallops, no pedal edema.   Central nervous system: alert and oriented x4. no gross focal neurologic deficits, normal speech Extremities: moves all, no edema, normal tone Skin: dry, intact, normal temperature Psychiatry: normal mood, congruent affect, judgement and insight appear normal    Data Reviewed: I have personally reviewed following labs and imaging studies  CBC: Recent Labs  Lab 05/25/19 0302 05/26/19 0435 05/27/19 0621 05/28/19 0249  WBC 4.4 4.5  4.6 4.4 5.2  NEUTROABS 2.8 2.5  2.4 2.5 3.2  HGB 13.1 12.7*  12.9* 12.4* 12.2*  HCT 41.3 40.0  38.9 39.7 38.8*  MCV 91.2 91.5  89 90.8 90.2  PLT 248 274  295 309 358   Basic Metabolic Panel: Recent Labs  Lab 05/24/19 1626 05/25/19 0302 05/26/19 0435 05/27/19 0621 05/28/19 0249  NA 135 133* 132* 137 138  K 4.1 4.5 4.1 4.5 4.6  CL 101 99 99 102 101  CO2 20* 23 23 24 26   GLUCOSE 268* 464* 273* 310* 363*  BUN 14 20 21* 22* 26*  CREATININE 0.94 1.28* 1.09 1.09 1.04  CALCIUM 8.1* 7.9* 7.4* 7.9* 8.2*   GFR: Estimated Creatinine Clearance: 140.8 mL/min (by C-G formula based on SCr of 1.04 mg/dL). Liver Function Tests: Recent Labs  Lab 05/24/19 1626 05/25/19 0302 05/26/19 0435 05/27/19 0621 05/28/19 0249  AST 35 34 34 26 26  ALT 46* 40 38 33 32  ALKPHOS 78 73 59 57 60  BILITOT 1.1 0.7 0.8 0.7 0.7  PROT 7.9 7.3 7.0 7.2 7.2  ALBUMIN 4.0 3.8 3.2* 3.2* 3.2*   No results for input(s): LIPASE, AMYLASE in the last 168 hours. No results for input(s): AMMONIA in the last 168 hours. Coagulation Profile: No results for input(s): INR, PROTIME in the last 168 hours. Cardiac Enzymes: No results for input(s): CKTOTAL, CKMB, CKMBINDEX, TROPONINI in the last 168 hours. BNP (last 3 results) No results for input(s): PROBNP in the last 8760 hours. HbA1C: Recent Labs    05/26/19 0435  HGBA1C 13.6*   CBG: Recent  Labs  Lab 05/27/19 1119 05/27/19 1625 05/27/19 2106 05/28/19 0746 05/28/19 1128  GLUCAP 362* 291* 353* 290* 330*   Lipid Profile: No results for input(s): CHOL, HDL, LDLCALC, TRIG, CHOLHDL, LDLDIRECT in the last 72 hours. Thyroid Function Tests: No results for input(s): TSH, T4TOTAL, FREET4, T3FREE, THYROIDAB in the last 72 hours. Anemia Panel: Recent Labs    05/27/19 0621 05/28/19 0249  FERRITIN 243 246   Sepsis Labs: Recent Labs  Lab 05/24/19 1808 05/26/19 0119 05/26/19 0406 05/26/19 0435 05/27/19 0621 05/28/19 0249  PROCALCITON <0.10  --  <0.10  --  <0.10 <0.10  LATICACIDVEN  --  1.2  --  1.0  --   --     Recent Results (from the past 240 hour(s))  CULTURE, BLOOD (ROUTINE X 2) w Reflex to ID Panel     Status: None (Preliminary result)   Collection Time: 05/26/19  1:19 AM   Specimen: BLOOD  Result Value Ref Range Status   Specimen Description BLOOD LEFT ANTECUBITAL  Final   Special Requests   Final    BOTTLES DRAWN AEROBIC AND ANAEROBIC Blood Culture adequate volume   Culture   Final    NO GROWTH 2 DAYS Performed at  [facility], [address], [place], [place] [address]    Report Status PENDING  Incomplete  CULTURE, BLOOD (ROUTINE X 2) w Reflex to ID Panel     Status: None (Preliminary result)   Collection Time: 05/26/19  1:19 AM   Specimen: BLOOD  Result Value Ref Range Status   Specimen Description BLOOD LEFT HAND  Final   Special Requests   Final    BOTTLES DRAWN AEROBIC AND ANAEROBIC Blood Culture adequate volume   Culture   Final    NO GROWTH 2 DAYS Performed at [facility], [address], [place], [place] [address]    Report Status PENDING  Incomplete         Radiology Studies: No results found.      Scheduled Meds: . aspirin EC  81 mg Oral Daily  . dexamethasone (DECADRON) injection  6 mg Intravenous Q24H  . enoxaparin (LOVENOX) injection  40 mg Subcutaneous Q12H  . furosemide  40 mg Oral Daily  . insulin  aspart  0-20 Units Subcutaneous TID WC  . insulin aspart  0-5 Units Subcutaneous QHS  . insulin aspart  26 Units Subcutaneous TID WC  . insulin glargine  45 Units Subcutaneous QHS  . lisinopril  20 mg Oral Daily  . simvastatin  10 mg Oral Daily  . zinc sulfate  220 mg Oral Daily   Continuous Infusions: . remdesivir 100 mg in NS 100 mL Stopped (05/28/19 0859)     LOS: 3 days    Time spent: 30 minutes    Ezekiel Slocumb, DO [facility]   If 7PM-7AM, please contact night-coverage [email or web address] 05/28/2019, 2:32 PM

## 2019-05-28 NOTE — Clinical Social Work Note (Signed)
CSW acknowledges consult for medication assistance. Will follow patient's progress and send medications to [facility] at discharge if needed.  Charlynn Court, CSW [phone]

## 2019-05-28 NOTE — Progress Notes (Signed)
CH spoke with Pt over the phone. Pt had just finished his lunch. CH asked if he has had support from family or friends at this time, and Pt responded that he does have support. Ch explained how she cannot be present in the room physically, and asked if he would like prayer. Pt requested prayer, "To be delivered from this COVID". 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 Ch prayed with Pt. Pt expressed being   05/28/19 1250  Clinical Encounter Type  Visited With Patient  Visit Type Initial;Spiritual support  Referral From Other (Comment)  Consult/Referral To None  Spiritual Encounters  Spiritual Needs Prayer;Emotional  Stress Factors  Patient Stress Factors Health changes   very grateful for prayer.

## 2019-05-28 NOTE — Evaluation (Signed)
Physical Therapy Evaluation Patient Details Name: [name] MRN: [id number] DOB: [birth year] Today's Date: 05/28/2019   History of Present Illness  [name] is a 58 yoM comes to [facility] on 05/24/19. 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 Pt admitted c COVID19 infection. CXR showing PNA. Chest CT negative for PE.  Clinical Impression  Pt admitted with above diagnosis. Pt currently with functional limitations due to the deficits listed below (see "PT Problem List"). Upon entry, pt in bed, awake and agreeable to participate. The pt is alert and oriented x4, pleasant, conversational, and generally a good historian. Pt received on 4L/min @ mid 90s% resting which remains consistent with slow AMB about the unit. Pt able to AMB 50ft in room on room air with desaturation to 88% and beginning SOB; SpO2 remains 87-90% on room air resting in recliner at end of session. Pt has significant DOE AMB in hallway which he attributes to mask wearing. Pt has tachycardia 117-122bpm throughout entire AMB session in hall. Functional mobility assessment demonstrates increased effort/time requirements, poor tolerance, and need for physical assistance, whereas the patient performed these at a higher level of independence PTA. Pt will benefit from skilled PT intervention to increase independence and safety with basic mobility in preparation for discharge to the venue listed below.       Follow Up Recommendations Home health PT    Equipment Recommendations  None recommended by PT    Recommendations for Other Services       Precautions / Restrictions Precautions Precautions: Fall Restrictions Weight Bearing Restrictions: No      Mobility  Bed Mobility Overal bed mobility: Modified Independent                 Transfers Overall transfer level: Modified independent Equipment used: None             General transfer comment: Low BP, which improves in sitting and again in standing.  Ambulation/Gait Ambulation/Gait assistance: Supervision Gait Distance (Feet): 240 Feet Assistive device: None Gait Pattern/deviations: WFL(Within Functional Limits) Gait velocity: 0.23m/s   General Gait Details: slow, maintained on 4L, high RR d/t mask, but no desaturation (94-97% SpO2). Pt able to AMB 47ft in room on room air with desat to 88%. Tachycardia in 120s.  Stairs            Wheelchair Mobility    Modified Rankin (Stroke Patients Only)       Balance Overall balance assessment: Modified Independent                                           Pertinent Vitals/Pain Pain Assessment: No/denies pain    Home Living Family/patient expects to be discharged to:: Private residence Living Arrangements: Spouse/significant other Available Help at Discharge: Family;Available 24 hours/day Type of Home: Apartment(handicapped apartment) Home Access: Level entry     Home Layout: One level Home Equipment: Walker - 4 wheels;Bedside commode;Cane - single point;Tub bench;Grab bars - toilet;Grab bars - tub/shower Additional Comments: Queen size adjustable bed, adjustable HOB and FOB.    Prior Function Level of Independence: Needs assistance   Gait / Transfers Assistance Needed: shopping cart for longer distance d/t bilat hip replacements 2017, history of back  surgery 2014 (3 level lumbar fushion) 2-5  ADL's / Homemaking Assistance Needed: independent; no recent falls        Hand Dominance   Dominant Hand: Right    Extremity/Trunk Assessment                Communication   Communication: No difficulties  Cognition Arousal/Alertness: Awake/alert Behavior During Therapy: WFL for tasks assessed/performed Overall Cognitive Status: Within Functional Limits for  tasks assessed                                        General Comments      Exercises     Assessment/Plan    PT Assessment Patient needs continued PT services  PT Problem List Decreased strength;Decreased range of motion;Decreased activity tolerance;Decreased mobility;Decreased knowledge of precautions;Cardiopulmonary status limiting activity       PT Treatment Interventions DME instruction;Gait training;Stair training;Functional mobility training;Therapeutic activities;Patient/family education;Therapeutic exercise    PT Goals (Current goals can be found in the Care Plan section)  Acute Rehab PT Goals Patient Stated Goal: Regain strength and independent AMB PT Goal Formulation: With patient Time For Goal Achievement: 06/11/19 Potential to Achieve Goals: Good    Frequency Min 2X/week   Barriers to discharge        Co-evaluation               AM-PAC PT "6 Clicks" Mobility  Outcome Measure Help needed turning from your back to your side while in a flat bed without using bedrails?: A Little Help needed moving from lying on your back to sitting on the side of a flat bed without using bedrails?: A Little Help needed moving to and from a bed to a chair (including a wheelchair)?: A Little Help needed standing up from a chair using your arms (e.g., wheelchair or bedside chair)?: A Little Help needed to walk in hospital room?: A Little Help needed climbing 3-5 steps with a railing? : A Little 6 Click Score: 18    End of Session Equipment Utilized During Treatment: Oxygen Activity Tolerance: Patient tolerated treatment well;No increased pain;Patient limited by fatigue;Treatment limited secondary to medical complications (Comment)(tachycardia, RR, SOB) Patient left: in chair;with call bell/phone within reach Nurse Communication: Mobility status PT Visit Diagnosis: Unsteadiness on feet (R26.81);Other abnormalities of gait and mobility (R26.89);Difficulty in  walking, not elsewhere classified (R26.2)    Time: 1610-9604 PT Time Calculation (min) (ACUTE ONLY): 52 min   Charges:   PT Evaluation $PT Eval Moderate Complexity: 1 Mod PT Treatments $Therapeutic Activity: 23-37 mins        8:08 PM, 05/28/19 Etta Grandchild, PT, DPT Physical Therapist - [facility]  [phone] ([place])    Ricci Dirocco C 05/28/2019, 8:05 PM

## 2019-05-29 DIAGNOSIS — E119 Type 2 diabetes mellitus without complications: Secondary | ICD-10-CM

## 2019-05-29 LAB — COMPREHENSIVE METABOLIC PANEL
ALT: 31 U/L (ref 0–44)
AST: 27 U/L (ref 15–41)
Albumin: 3.4 g/dL — ABNORMAL LOW (ref 3.5–5.0)
Alkaline Phosphatase: 56 U/L (ref 38–126)
Anion gap: 8 (ref 5–15)
BUN: 29 mg/dL — ABNORMAL HIGH (ref 6–20)
CO2: 28 mmol/L (ref 22–32)
Calcium: 8.4 mg/dL — ABNORMAL LOW (ref 8.9–10.3)
Chloride: 100 mmol/L (ref 98–111)
Creatinine, Ser: 0.89 mg/dL (ref 0.61–1.24)
GFR calc Af Amer: >60 mL/min (ref >60)
GFR calc non Af Amer: >60 mL/min (ref >60)
Glucose, Bld: 295 mg/dL — ABNORMAL HIGH (ref 70–99)
Potassium: 4.4 mmol/L (ref 3.5–5.1)
Sodium: 136 mmol/L (ref 135–145)
Total Bilirubin: 0.5 mg/dL (ref 0.3–1.2)
Total Protein: 7.1 g/dL (ref 6.5–8.1)

## 2019-05-29 LAB — CBC WITH DIFFERENTIAL/PLATELET
Abs Immature Granulocytes: 0.04 10*3/uL (ref 0.00–0.07)
Basophils Absolute: 0 10*3/uL (ref 0.0–0.1)
Basophils Relative: 0 %
Eosinophils Absolute: 0 10*3/uL (ref 0.0–0.5)
Eosinophils Relative: 0 %
HCT: 38.4 % — ABNORMAL LOW (ref 39.0–52.0)
Hemoglobin: 12.1 g/dL — ABNORMAL LOW (ref 13.0–17.0)
Immature Granulocytes: 1 %
Lymphocytes Relative: 31 %
Lymphs Abs: 1.9 10*3/uL (ref 0.7–4.0)
MCH: 28.4 pg (ref 26.0–34.0)
MCHC: 31.5 g/dL (ref 30.0–36.0)
MCV: 90.1 fL (ref 80.0–100.0)
Monocytes Absolute: 0.6 10*3/uL (ref 0.1–1.0)
Monocytes Relative: 10 %
Neutro Abs: 3.5 10*3/uL (ref 1.7–7.7)
Neutrophils Relative %: 58 %
Platelets: 413 10*3/uL — ABNORMAL HIGH (ref 150–400)
RBC: 4.26 MIL/uL (ref 4.22–5.81)
RDW: 12.8 % (ref 11.5–15.5)
Smear Review: NORMAL
WBC: 6 10*3/uL (ref 4.0–10.5)
nRBC: 0 % (ref 0.0–0.2)

## 2019-05-29 LAB — C-REACTIVE PROTEIN: CRP: 5.6 mg/dL — ABNORMAL HIGH (ref <1.0)

## 2019-05-29 LAB — GLUCOSE, CAPILLARY
Glucose-Capillary: 227 mg/dL — ABNORMAL HIGH (ref 70–99)
Glucose-Capillary: 272 mg/dL — ABNORMAL HIGH (ref 70–99)
Glucose-Capillary: 201 mg/dL — ABNORMAL HIGH (ref 70–99)
Glucose-Capillary: 313 mg/dL — ABNORMAL HIGH (ref 70–99)

## 2019-05-29 LAB — HIV-1 RNA, PCR (GRAPH) RFX/GENO EDI
HIV-1 RNA BY PCR: <20 copies/mL
HIV-1 RNA Quant, Log: UNDETERMINED log10copy/mL

## 2019-05-29 LAB — FERRITIN: Ferritin: 206 ng/mL (ref 24–336)

## 2019-05-29 LAB — FUNGITELL, SERUM: Fungitell Result: <31 pg/mL (ref <80)

## 2019-05-29 LAB — FIBRIN DERIVATIVES D-DIMER (ARMC ONLY): Fibrin derivatives D-dimer (ARMC): 506.61 ng/mL (FEU) — ABNORMAL HIGH (ref 0.00–499.00)

## 2019-05-29 MED ORDER — INSULIN DETEMIR 100 UNIT/ML ~~LOC~~ SOLN
30.0000 [IU] | Freq: Two times a day (BID) | SUBCUTANEOUS | Status: DC
  Administered 2019-05-29 – 2019-05-30 (×2): 30 [IU] via SUBCUTANEOUS
  Filled 2019-05-29 (×4): qty 0.3

## 2019-05-29 NOTE — Progress Notes (Addendum)
Inpatient Diabetes Program Recommendations  AACE/ADA: New Consensus Statement on Inpatient Glycemic Control  Target Ranges:  Prepandial:   less than 140 mg/dL      Peak postprandial:   less than 180 mg/dL (1-2 hours)      Critically ill patients:  140 - 180 mg/dL   Results for [name], [name] (MRN [id number]) as of 05/29/2019 10:18  Ref. Range 05/28/2019 07:46 05/28/2019 11:28 05/28/2019 16:49 05/28/2019 21:12 05/29/2019 08:08  Glucose-Capillary Latest Ref Range: 70 - 99 mg/dL 585 (H) 277 (H) 824 (H) 332 (H) 227 (H)    Review of Glycemic Control  Diabetes history:DM2 Outpatient Diabetes medications:Soliqua 100-33 60 units daily, Metformin 1000 mg BID Current orders for Inpatient glycemic control:Lantus 45 units QHS, Novolog 26 units TID with meals, Novolog 0-20 units TID with meals, Novolog 0-5 units QHS; Decadron 6 mg Q24H  Inpatient Diabetes Program Recommendations:  Insulin - Basal: If steroids are continued, please consider discontinuing Lantus and ordering Levemir 30 units BID to start at bedtime today (peak of Levemir tends to work better when steroids are ordered and BID dosing will allow adjustments to be made sooner).  HgbA1C: A1C 13.6% on 1/17/21indicating an average glucose of 344 mg/dlover the past 2-3 months. Prior A1C noted in Care Everywhere 9.9% on 03/08/19.  Addendum 05/29/19: Spoke with patient over the phone regarding DM control, A1C, medication needs, and impact of steroids on glycemic control. Patient reports that he has been out of [place] for a couple of months but he still has Metformin at home. Patient currently has no insurance and no local PCP.  [facility]ed [facility] and [facility]) and informed patient that TOC is consulted and will be assisting with medication needs and follow up.  Patient would like to get application for [facility] and [facility] and establish care with [facility]. Patient reports that he has a FreeStyle Libre sensor at home and  he plans to put it on when he gets home which will last for 14 days. Discussed Reli-On Classic glucometer $9 at [facility] and box of 50 test strips for $9 in case patient needs to purchase testing supplies out of pocket. Discussed current insulin regimen and discussed how steroids impact glycemic control. Informed patient that MD plans to send him home on steroid taper which is going to contribute to hyperglycemia. Explained that once steroids are completed, his insulin needs will likely change and he may need to have insulin dosages adjusted.  Discussed A1C results (13.6% on 05/26/19 ) and explained that current A1C indicates an average glucose of 344 mg/dl over the past 2-3 months. Discussed glucose and A1C goals. Discussed importance of checking CBGs and maintaining good CBG control to prevent long-term and short-term complications. Explained how hyperglycemia leads to damage within blood vessels which lead to the common complications seen with uncontrolled diabetes. Stressed to the patient the importance of improving glycemic control to prevent further complications from uncontrolled diabetes. Informed patient that [facility] has Basaglar (and limited supply of Levemir) and Humalog insulin pens; discussed each insulin and how they work. Patient was using Soliqua insulin pen and he is comfortable with using insulin pens. Encouraged patient to contact [facility] if he has any issues with hypoglycemia so they can instruct him on what insulin adjustments to make.  Patient appreciative of discussion and resources and verbalized understanding of information discussed and reports no further questions at this time related to diabetes. Would recommend discharging patient on similar regimen as being used inpatient.  At time of discharge please provide Rx for: Levemir Flextouch [phone]), Humalog Kwikpen [phone]), insulin pen needles [phone]).    Thanks, Barnie Alderman, RN, MSN, CDE Diabetes Coordinator Inpatient Diabetes  Program [phone] (Team Pager from 8am to 5pm)

## 2019-05-29 NOTE — Clinical Social Work Note (Signed)
Gave patient's information to [employer] representative regarding home oxygen.  Charlynn Court, CSW [phone]

## 2019-05-29 NOTE — Progress Notes (Signed)
PROGRESS NOTE    [name]  IRJ:[id number] DOB: August 03, 1975 DOA: 05/24/2019  PCP: [facility]    LOS - 4   Brief Narrative:  44 y.o.malewith medical history ofinsulin-dependent type 2 diabetes, lumbar stenosis with neurogenic claudication, and morbid obesity who presented to ED on 1/15 withbody aches, progressiveshortness of breathfor past 3 days.In the ED, febrile 100.5, tachycardic up to 120, no hypoxia. Labs notable for no leukocytosis, negative procalcitonin, negative troponin and BNP. Covid-19 antigen positive. Chest x-ray showed right middle lobe pneumonia.Admitted to hospitalist service, treating with remdesivir, vitamin C, zinc.Patient with persistent fevers, and did develop hypoxia on 1/16, initially on 2-3 L/min, was up to 6 L/minfor couple of dayswith ongoing tachycardia and tachypnea.  1/19 improved vitals and oxygen weaned to 3-4 L/min.  Today, on 2 L/min.  Qualifies for 2 L/min home oxygen, ordered for discharge.  Subjective 1/20: Patient seen this morning.  Says feeling little better.  Still short of breath when up walking.  Discussed potential need for oxygen on discharge, he is agreeable.  No fever/chills.  Flutter valve helping to get phlegm to come up.    Assessment & Plan:   Principal Problem:   COVID-19 virus infection Active Problems:   Morbid obesity (HCC)   Type 2 diabetes mellitus without complication, with long-term current use of insulin (HCC)   Essential hypertension   Dyslipidemia   Acute Hypoxic Respiratory failure due to Covid-19 COVID-19 infection Myalgias secondary to above Generalized weakness secondary to above --Remdesivir, completed 1/20 --Continue Decadron, started 1/17 - 10 day course --Continuous pulse ox --supplemental oxygen to keep sat > 90% --Vitamin C and zinc --Aspirin, Pepcid --Monitor inflammatory markers  Positive HIVscreen- HIV ruled out, negative confirmatory results came back  overnight. Patient informed.  He confirmed no prior diagnosis of HIV. He denies risk factors including history of IV drug use or unprotected sex with males or females other than his wife. Wife and patient have been monogamous since marriage (both were virgins). --CD4 count and viral load- pending  Type 2 diabeteswith hyperglycemia due to steroid Takes60 units of Soliquaat home.BGuncontrolled at268 onadmission.  A1c 13.6. --d/cLantus  --start Levemir 30 units BID --ContinueNovoLog 26units with meals --Continue moderate sliding scale NovoLog --Titrate insulin as needed for glucose 140-180 --Diabetes coordinator following --medications will need to be sent to Medication Management on discharge (patient has no insulin at home)  Morbid obesity BMI > 40 --Complicates comorbidities  Essential Hypertension --continue Lasix and lisinopril   Hyperlipidemia --continue statin   DVT prophylaxis:Lovenox Code Status: Full Code Family Communication:None at bedside Disposition Plan: Likely for discharge home tomorrow 1/21 with 2 L/min oxygen and home health PT.    Consultants:   None  Procedures:   None  Antimicrobials:   None    Objective: Vitals:   05/29/19 0854 05/29/19 0930 05/29/19 0935 05/29/19 0940  BP: 110/80     Pulse: 93     Resp: 20     Temp:      TempSrc:      SpO2: 92% 90% (!) 87% 92%  Weight:      Height:        Intake/Output Summary (Last 24 hours) at 05/29/2019 1420 Last data filed at 05/29/2019 1106 Gross per 24 hour  Intake 100 ml  Output 1350 ml  Net -1250 ml   Filed Weights   05/24/19 1622  Weight: (!) 154.2 kg    Examination:  General exam: awake, alert, no acute distress, obese, less  ill appearing HEENT: moist mucus membranes, hearing grossly normal  Respiratory system: better aeration today, fine basilar crackles improveing, no wheezes or rhonchi, normal respiratory effort.  On 2 L/min oxygen [place]. Cardiovascular  system: normal S1/S2, RRR, no pedal edema.   Central nervous system: alert and oriented x4. no gross focal neurologic deficits, normal speech Extremities: moves all, no edema, normal tone Psychiatry: normal mood, congruent affect, judgement and insight appear normal    Data Reviewed: I have personally reviewed following labs and imaging studies  CBC: Recent Labs  Lab 05/26/19 0435 05/27/19 0621 05/28/19 0249 05/29/19 0310  WBC 4.5  4.6 4.4 5.2 6.0  NEUTROABS 2.5  2.4 2.5 3.2 3.5  HGB 12.7*  12.9* 12.4* 12.2* 12.1*  HCT 40.0  38.9 39.7 38.8* 38.4*  MCV 91.5  89 90.8 90.2 90.1  PLT 274  295 309 358 373*   Basic Metabolic Panel: Recent Labs  Lab 05/25/19 0302 05/26/19 0435 05/27/19 0621 05/28/19 0249 05/29/19 0310  NA 133* 132* 137 138 136  K 4.5 4.1 4.5 4.6 4.4  CL 99 99 102 101 100  CO2 23 23 24 26 28   GLUCOSE 464* 273* 310* 363* 295*  BUN 20 21* 22* 26* 29*  CREATININE 1.28* 1.09 1.09 1.04 0.89  CALCIUM 7.9* 7.4* 7.9* 8.2* 8.4*   GFR: Estimated Creatinine Clearance: 164.6 mL/min (by C-G formula based on SCr of 0.89 mg/dL). Liver Function Tests: Recent Labs  Lab 05/25/19 0302 05/26/19 0435 05/27/19 0621 05/28/19 0249 05/29/19 0310  AST 34 34 26 26 27   ALT 40 38 33 32 31  ALKPHOS 73 59 57 60 56  BILITOT 0.7 0.8 0.7 0.7 0.5  PROT 7.3 7.0 7.2 7.2 7.1  ALBUMIN 3.8 3.2* 3.2* 3.2* 3.4*   No results for input(s): LIPASE, AMYLASE in the last 168 hours. No results for input(s): AMMONIA in the last 168 hours. Coagulation Profile: No results for input(s): INR, PROTIME in the last 168 hours. Cardiac Enzymes: No results for input(s): CKTOTAL, CKMB, CKMBINDEX, TROPONINI in the last 168 hours. BNP (last 3 results) No results for input(s): PROBNP in the last 8760 hours. HbA1C: No results for input(s): HGBA1C in the last 72 hours. CBG: Recent Labs  Lab 05/28/19 1128 05/28/19 1649 05/28/19 2112 05/29/19 0808 05/29/19 1115  GLUCAP 330* 239* 332* 227* 272*     Lipid Profile: No results for input(s): CHOL, HDL, LDLCALC, TRIG, CHOLHDL, LDLDIRECT in the last 72 hours. Thyroid Function Tests: No results for input(s): TSH, T4TOTAL, FREET4, T3FREE, THYROIDAB in the last 72 hours. Anemia Panel: Recent Labs    05/28/19 0249 05/29/19 0310  FERRITIN 246 206   Sepsis Labs: Recent Labs  Lab 05/24/19 1808 05/26/19 0119 05/26/19 0406 05/26/19 0435 05/27/19 0621 05/28/19 0249  PROCALCITON <0.10  --  <0.10  --  <0.10 <0.10  LATICACIDVEN  --  1.2  --  1.0  --   --     Recent Results (from the past 240 hour(s))  CULTURE, BLOOD (ROUTINE X 2) w Reflex to ID Panel     Status: None (Preliminary result)   Collection Time: 05/26/19  1:19 AM   Specimen: BLOOD  Result Value Ref Range Status   Specimen Description BLOOD LEFT ANTECUBITAL  Final   Special Requests   Final    BOTTLES DRAWN AEROBIC AND ANAEROBIC Blood Culture adequate volume   Culture   Final    NO GROWTH 3 DAYS Performed at [facility], [address], [place], [place] [address]  Report Status PENDING  Incomplete  CULTURE, BLOOD (ROUTINE X 2) w Reflex to ID Panel     Status: None (Preliminary result)   Collection Time: 05/26/19  1:19 AM   Specimen: BLOOD  Result Value Ref Range Status   Specimen Description BLOOD LEFT HAND  Final   Special Requests   Final    BOTTLES DRAWN AEROBIC AND ANAEROBIC Blood Culture adequate volume   Culture   Final    NO GROWTH 3 DAYS Performed at [facility], [address] [place], [place] [address]    Report Status PENDING  Incomplete         Radiology Studies: No results found.      Scheduled Meds: . aspirin EC  81 mg Oral Daily  . dexamethasone (DECADRON) injection  6 mg Intravenous Q24H  . enoxaparin (LOVENOX) injection  40 mg Subcutaneous Q12H  . famotidine  20 mg Oral Daily  . furosemide  40 mg Oral Daily  . insulin aspart  0-20 Units Subcutaneous TID WC  . insulin aspart  0-5 Units Subcutaneous QHS  .  insulin aspart  26 Units Subcutaneous TID WC  . insulin glargine  45 Units Subcutaneous QHS  . lisinopril  20 mg Oral Daily  . simvastatin  10 mg Oral Daily  . zinc sulfate  220 mg Oral Daily   Continuous Infusions:   LOS: 4 days    Time spent: 30-35 minutes    Pennie Banter, DO [facility]   If 7PM-7AM, please contact night-coverage [email or web address] 05/29/2019, 2:20 PM

## 2019-05-29 NOTE — Progress Notes (Signed)
SATURATION QUALIFICATIONS: (This note is used to comply with regulatory documentation for home oxygen)  Patient Saturations on Room Air at Rest = 90%  Patient Saturations on Room Air while Ambulating = 86-87%  Patient Saturations on 2 Liters of oxygen while Ambulating = 91%

## 2019-05-29 NOTE — Progress Notes (Signed)
Physical Therapy Treatment Patient Details Name: [name] MRN: [id number] DOB: 1976-02-01 Today's Date: 05/29/2019    History of Present Illness [name] is a 83 yoM [facility]. 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 Pt admitted c COVID19 infection. CXR showing PNA. Chest CT negative for PE.    PT Comments    Pt was side lying in bed upon arriving. He agrees to PT session and is cooperative throughout. Pt states" i'm ready to be in my own bed". Pt demonstrated safe ability to exit bed with Mod I. He was on 2 L o2 throughout session without desaturation(maintained >90%). Pt was able to ambulate in hallway without use of AD 300 ft with one standing rest break. After ambulating in hallway pt return to room and was able to tolerate standing exercises. Pt performed BLEs 10 rep; hip abduction, heel raises, SLR, and marching.   Pt fatigues but overall is progressing well. Pt was returned to bed post session with call bell in reach.    Follow Up Recommendations  Home health PT     Equipment Recommendations  None recommended by PT    Recommendations for Other Services       Precautions / Restrictions Precautions Precautions: Fall Restrictions Weight Bearing Restrictions: No    Mobility  Bed Mobility Overal bed mobility: Modified Independent             General bed mobility comments: HOB elevated   Transfers Overall transfer level: Modified independent Equipment used: None             General transfer comment: Pt demonstarted safe ability to sit to/from stand  Ambulation/Gait Ambulation/Gait assistance: Supervision Gait Distance (Feet): 300 Feet Assistive device: None Gait Pattern/deviations: WFL(Within Functional Limits)     General Gait  Details: pt on 2L o2 nasal cannula with maintaining oxygen level above >90%   Stairs             Wheelchair Mobility    Modified Rankin (Stroke Patients Only)       Balance Overall balance assessment: Modified Independent                                          Cognition Arousal/Alertness: Awake/alert Behavior During Therapy: WFL for tasks assessed/performed Overall Cognitive Status: Within Functional Limits for tasks assessed                                        Exercises      General Comments        Pertinent Vitals/Pain Pain Assessment: No/denies pain    Home Living                      Prior Function            PT Goals (current goals can now be found in the care plan section)      Frequency    Min 2X/week      PT Plan Current plan remains appropriate    Co-evaluation  AM-PAC PT "6 Clicks" Mobility   Outcome Measure  Help needed turning from your back to your side while in a flat bed without using bedrails?: A Little Help needed moving from lying on your back to sitting on the side of a flat bed without using bedrails?: A Little Help needed moving to and from a bed to a chair (including a wheelchair)?: A Little Help needed standing up from a chair using your arms (e.g., wheelchair or bedside chair)?: A Little Help needed to walk in hospital room?: A Little Help needed climbing 3-5 steps with a railing? : A Little 6 Click Score: 18    End of Session Equipment Utilized During Treatment: Oxygen Activity Tolerance: Patient tolerated treatment well;Patient limited by fatigue;No increased pain Patient left: in bed;with call bell/phone within reach Nurse Communication: Mobility status       Time: 1505-1530 PT Time Calculation (min) (ACUTE ONLY): 25 min  Charges:  $Gait Training: 8-22 mins $Therapeutic Exercise: 8-22 mins                    Jetta Lout PTA 05/29/19,  3:38 PM   Rushie Chestnut 05/29/2019, 3:32 PM

## 2019-05-30 DIAGNOSIS — E1165 Type 2 diabetes mellitus with hyperglycemia: Secondary | ICD-10-CM

## 2019-05-30 DIAGNOSIS — I10 Essential (primary) hypertension: Secondary | ICD-10-CM

## 2019-05-30 DIAGNOSIS — J9601 Acute respiratory failure with hypoxia: Secondary | ICD-10-CM

## 2019-05-30 DIAGNOSIS — U071 COVID-19: Principal | ICD-10-CM

## 2019-05-30 DIAGNOSIS — Z794 Long term (current) use of insulin: Secondary | ICD-10-CM

## 2019-05-30 LAB — GLUCOSE, CAPILLARY
Glucose-Capillary: 223 mg/dL — ABNORMAL HIGH (ref 70–99)
Glucose-Capillary: 248 mg/dL — ABNORMAL HIGH (ref 70–99)

## 2019-05-30 LAB — C-REACTIVE PROTEIN: CRP: 3.4 mg/dL — ABNORMAL HIGH (ref <1.0)

## 2019-05-30 LAB — FIBRIN DERIVATIVES D-DIMER (ARMC ONLY): Fibrin derivatives D-dimer (ARMC): 475.29 ng/mL (FEU) (ref 0.00–499.00)

## 2019-05-30 MED ORDER — VITAMIN C 250 MG PO TABS: 250.0000 mg | ORAL_TABLET | Freq: Every day | ORAL | 0 refills | Status: DC

## 2019-05-30 MED ORDER — LEVEMIR FLEXTOUCH 100 UNIT/ML ~~LOC~~ SOPN: 30.0000 [IU] | PEN_INJECTOR | Freq: Two times a day (BID) | SUBCUTANEOUS | 11 refills | Status: DC

## 2019-05-30 MED ORDER — INSULIN PEN NEEDLE 32G X 4 MM MISC: 1.0000 "application " | Freq: Three times a day (TID) | 3 refills | Status: AC

## 2019-05-30 MED ORDER — INSULIN PEN NEEDLE 32G X 4 MM MISC: 1.0000 "application " | Freq: Three times a day (TID) | 3 refills | Status: DC

## 2019-05-30 MED ORDER — ZINC SULFATE 220 (50 ZN) MG PO CAPS: 220.0000 mg | ORAL_CAPSULE | Freq: Every day | ORAL | 0 refills | Status: DC

## 2019-05-30 MED ORDER — INSULIN LISPRO (1 UNIT DIAL) 100 UNIT/ML (KWIKPEN): 25.0000 [IU] | PEN_INJECTOR | Freq: Three times a day (TID) | SUBCUTANEOUS | 11 refills | Status: DC

## 2019-05-30 MED ORDER — PREDNISONE 20 MG PO TABS: 40.0000 mg | ORAL_TABLET | Freq: Every day | ORAL | 0 refills | Status: DC

## 2019-05-30 MED ORDER — ZINC SULFATE 220 (50 ZN) MG PO CAPS: 220.0000 mg | ORAL_CAPSULE | Freq: Every day | ORAL | 0 refills | Status: AC

## 2019-05-30 MED ORDER — INSULIN LISPRO (1 UNIT DIAL) 100 UNIT/ML (KWIKPEN): 25.0000 [IU] | PEN_INJECTOR | Freq: Three times a day (TID) | SUBCUTANEOUS | 11 refills | Status: AC

## 2019-05-30 MED ORDER — PREDNISONE 20 MG PO TABS: 40.0000 mg | ORAL_TABLET | Freq: Every day | ORAL | 0 refills | Status: AC

## 2019-05-30 MED ORDER — VITAMIN C 250 MG PO TABS: 250.0000 mg | ORAL_TABLET | Freq: Every day | ORAL | 0 refills | Status: AC

## 2019-05-30 MED ORDER — LEVEMIR FLEXTOUCH 100 UNIT/ML ~~LOC~~ SOPN: 30.0000 [IU] | PEN_INJECTOR | Freq: Two times a day (BID) | SUBCUTANEOUS | 11 refills | Status: AC

## 2019-05-30 NOTE — Discharge Planning (Signed)
IV removed.  RN assessment and VS revealed stability for DC to home with HH/PT annd O2 for activity.  DC paper given, explained and educated. Informed of importance of continued self quarantine 2 weeks beyond DC.  Talked about monitoring self and anyone else in the home for increased difficulty breathing, increased lethargicness/weakness or uncontrolled fever - seek med help if needed.  Told of FU appt needed in 2 weeks.  Also given home pulse ox and [facility].  DC meds and O2 given prior to D and HH to deliver concentrator to his home. DC contract signed and place in chart.  Once ready, will be wheeled to front and family transporting home via car.

## 2019-05-30 NOTE — TOC Transition Note (Signed)
Transition of Care [facility]) - CM/SW Discharge Note   Patient Details  Name: HOLLIS TULLER MRN: [id number] Date of Birth: 1976-03-08  Transition of Care [facility]) CM/SW Contact:  Margarito Liner, LCSW Phone Number: 05/30/2019, 2:52 PM   Clinical Narrative: Patient has orders to discharge home today. Medications and oxygen have been delivered. No further concerns. CSW signing off.    Final next level of care: [name] Barriers to Discharge: Barriers Resolved   Patient Goals and CMS Choice     Choice offered to / list presented to : Patient  Discharge Placement                Patient to be transferred to facility by: family   Patient and family notified of of transfer: 05/30/19  Discharge Plan and Services     Post Acute Care Choice: Home Health, Durable Medical Equipment          DME Arranged: Oxygen DME Agency: [facility] Date DME Agency Contacted: 05/30/19   Representative spoke with at DME Agency: Mitchell Heir [facility] Arranged: PT HH Agency: [facility] Date HH Agency Contacted: 05/30/19   Representative spoke with at [facility] [place] [facility] Agency: Mellissa Kohut  Social Determinants of Health (SDOH) Interventions     Readmission Risk Interventions No flowsheet data found.

## 2019-05-30 NOTE — TOC Initial Note (Addendum)
Transition of Care [facility]) - Initial/Assessment Note    Patient Details  Name: [name] MRN: [id number] Date of Birth: Feb 02, 1976  Transition of Care [facility]) CM/SW Contact:    Nicholas Chroman, LCSW Phone Number: 05/30/2019, 9:55 AM  Clinical Narrative: Patient on COVID isolation precautions. CSW called patient in room, introduced role, and explained that PT recommendations would be discussed. Patient agreeable to HHPT. [facility] is taking [facility] patients this week and have accepted his referral. They will follow up with patient once discharged to schedule initial appt. [facility] is aware of plan for discharge today and will arrange oxygen for him to take home. Will review discharge meds once AVS is in to determine if patient can afford GoodRx costs or if meds need to be sent to [facility]. Will provide free/low-cost healthcare booklet and [facility] paperwork prior to patient leaving. Per RN, family will pick him up today. No further concerns. CSW encouraged patient to contact CSW as needed. CSW will continue to follow patient for support and facilitate return home today.                12:31 pm: RN faxed prescriptions to [facility]. CSW spoke with [name] to make him aware. He said they should be ready around 2:00. He will find out if this CSW needs to go pick them up or if a pharmacist can deliver them to the hospital. CSW delivered PCP booklet for free/low-cost healthcare in [facility], [facility] intake paperwork, and new oxygen/COVID kit with pulse oximeter and thermometer to the unit to be given to patient.  Expected Discharge Plan: [place] Barriers to Discharge: Continued Medical Work up   Patient Goals and CMS Choice     Choice offered to / list presented to : Patient  Expected Discharge Plan and Services Expected Discharge Plan: [place] Acute Care Choice: Home Health, Durable  Medical Equipment Living arrangements for the past 2 months: Apartment Expected Discharge Date: 05/27/19               DME Arranged: Oxygen DME Agency: [facility] Date DME Agency Contacted: 05/30/19   Representative spoke with at DME Agency: [name]: PT [place]: [facility] [employer]) Date [place]: 05/30/19   Representative spoke with at [place]: [name]  Prior Living Arrangements/Services Living arrangements for the past 2 months: Apartment Lives with:: Spouse Patient language and need for interpreter reviewed:: Yes Do you feel safe going back to the place where you live?: Yes      Need for Family Participation in Patient Care: Yes (Comment) Care giver support system in place?: Yes (comment)   Criminal Activity/Legal Involvement Pertinent to Current Situation/Hospitalization: No - Comment as needed  Activities of Daily Living Home Assistive Devices/Equipment: None ADL Screening (condition at time of admission) Patient's cognitive ability adequate to safely complete daily activities?: Yes Is the patient deaf or have difficulty hearing?: No Does the patient have difficulty seeing, even when wearing glasses/contacts?: No Does the patient have difficulty concentrating, remembering, or making decisions?: No Patient able to express need for assistance with ADLs?: Yes Does the patient have difficulty dressing or bathing?: No Independently performs ADLs?: Yes (appropriate for developmental age) Does the patient have difficulty walking or climbing stairs?: No Weakness of Legs: None Weakness of Arms/Hands: None  Permission Sought/Granted Permission sought to share information with :  Facility Art therapist granted to share information with : Yes, Verbal Permission Granted     Permission granted to share info w AGENCY: [facility]        Emotional Assessment Appearance:: Appears stated  age Attitude/Demeanor/Rapport: Engaged, Gracious Affect (typically observed): Accepting, Appropriate, Calm, Pleasant Orientation: : Oriented to Self, Oriented to Place, Oriented to  Time, Oriented to Situation Alcohol / Substance Use: Not Applicable Psych Involvement: No (comment)  Admission diagnosis:  Tachycardia [R00.0] Community acquired pneumonia of right middle lobe of lung [J18.9] COVID-19 virus infection [U07.1] COVID-19 [U07.1] Patient Active Problem List   Diagnosis Date Noted  . COVID-19 virus infection 05/24/2019  . Essential hypertension 05/24/2019  . Dyslipidemia 05/24/2019  . Type 2 diabetes mellitus without complication, with long-term current use of insulin ([place]) 09/18/2017  . Family history of bilateral hip replacements 08/25/2016  . Status post total replacement of right hip 09/25/2015  . Osteoarthritis of right hip 07/24/2015  . Morbid obesity ([place]) 07/24/2015  . Osteoarthritis of left hip 07/21/2015  . Status post total replacement of left hip 07/21/2015  . Lumbar stenosis with neurogenic claudication 01/14/2014  . Spinal stenosis, lumbar region, with neurogenic claudication 07/30/2012   PCP:  [name] [facility] Pharmacy:   [id number] [name], [name] [address] Phone: [phone] Fax: [phone]  [place], [place] - [address] [place] [address] Phone: [phone] Fax: [phone]     Social Determinants of Health (SDOH) Interventions    Readmission Risk Interventions No flowsheet data found.

## 2019-05-30 NOTE — Discharge Instructions (Addendum)
COVID-19: How to Protect Yourself and Others Know how it spreads  There is currently no vaccine to prevent coronavirus disease 2019 (COVID-19).  The best way to prevent illness is to avoid being exposed to this virus.  The virus is thought to spread mainly from person-to-person. ? Between people who are in close contact with one another (within about 6 feet). ? Through respiratory droplets produced when an infected person coughs, sneezes or talks. ? These droplets can land in the mouths or noses of people who are nearby or possibly be inhaled into the lungs. ? COVID-19 may be spread by people who are not showing symptoms. Everyone should Clean your hands often  Wash your hands often with soap and water for at least 20 seconds especially after you have been in a public place, or after blowing your nose, coughing, or sneezing.  If soap and water are not readily available, use a hand sanitizer that contains at least 60% alcohol. Cover all surfaces of your hands and rub them together until they feel dry.  Avoid touching your eyes, nose, and mouth with unwashed hands. Avoid close contact  Limit contact with others as much as possible.  Avoid close contact with people who are sick.  Put distance between yourself and other people. ? Remember that some people without symptoms may be able to spread virus. ? This is especially important for people who are at higher risk of getting very GainPain.com.cy Cover your mouth and nose with a mask when around others  You could spread COVID-19 to others even if you do not feel sick.  Everyone should wear a mask in public settings and when around people not living in their household, especially when social distancing is difficult to maintain. ? Masks should not be placed on young children under age 37, anyone who has trouble breathing, or is unconscious, incapacitated or otherwise  unable to remove the mask without assistance.  The mask is meant to protect other people in case you are infected.  Do NOT use a facemask meant for a Dietitian.  Continue to keep about 6 feet between yourself and others. The mask is not a substitute for social distancing. Cover coughs and sneezes  Always cover your mouth and nose with a tissue when you cough or sneeze or use the inside of your elbow.  Throw used tissues in the trash.  Immediately wash your hands with soap and water for at least 20 seconds. If soap and water are not readily available, clean your hands with a hand sanitizer that contains at least 60% alcohol. Clean and disinfect  Clean AND disinfect frequently touched surfaces daily. This includes tables, doorknobs, light switches, countertops, handles, desks, phones, keyboards, toilets, faucets, and sinks. RackRewards.fr  If surfaces are dirty, clean them: Use detergent or soap and water prior to disinfection.  Then, use a household disinfectant. You can see a list of EPA-registered household disinfectants here. michellinders.com 01/09/2019 This information is not intended to replace advice given to you by your health care provider. Make sure you discuss any questions you have with your health care provider. Document Revised: 01/17/2019 Document Reviewed: 11/15/2018 Elsevier Patient Education  [place] Can Do to Manage Your COVID-19 Symptoms at Home If you have possible or confirmed COVID-19: 1. Stay home from work and school. And stay away from other public places. If you must go out, avoid using any kind of public transportation, ridesharing, or taxis. 2. Monitor your symptoms  carefully. If your symptoms get worse, call your healthcare provider immediately. 3. Get rest and stay hydrated. 4. If you have a medical appointment, call the healthcare provider ahead of  time and tell them that you have or may have COVID-19. 5. For medical emergencies, call 911 and notify the dispatch personnel that you have or may have COVID-19. 6. Cover your cough and sneezes with a tissue or use the inside of your elbow. 7. Wash your hands often with soap and water for at least 20 seconds or clean your hands with an alcohol-based hand sanitizer that contains at least 60% alcohol. 8. As much as possible, stay in a specific room and away from other people in your home. Also, you should use a separate bathroom, if available. If you need to be around other people in or outside of the home, wear a mask. 9. Avoid sharing personal items with other people in your household, like dishes, towels, and bedding. 10. Clean all surfaces that are touched often, like counters, tabletops, and doorknobs. Use household cleaning sprays or wipes according to the label instructions. SouthAmericaFlowers.co.uk 11/07/2018 This information is not intended to replace advice given to you by your health care provider. Make sure you discuss any questions you have with your health care provider. Document Revised: 04/11/2019 Document Reviewed: 04/11/2019 Elsevier Patient Education  2020 Elsevier Inc.     Diabetes Mellitus and Nutrition, Adult When you have diabetes (diabetes mellitus), it is very important to have healthy eating habits because your blood sugar (glucose) levels are greatly affected by what you eat and drink. Eating healthy foods in the appropriate amounts, at about the same times every day, can help you:  Control your blood glucose.  Lower your risk of heart disease.  Improve your blood pressure.  Reach or maintain a healthy weight. Every person with diabetes is different, and each person has different needs for a meal plan. Your health care provider may recommend that you work with a diet and nutrition specialist (dietitian) to make a meal plan that is best for you. Your meal plan may vary  depending on factors such as:  The calories you need.  The medicines you take.  Your weight.  Your blood glucose, blood pressure, and cholesterol levels.  Your activity level.  Other health conditions you have, such as heart or kidney disease. How do carbohydrates affect me? Carbohydrates, also called carbs, affect your blood glucose level more than any other type of food. Eating carbs naturally raises the amount of glucose in your blood. Carb counting is a method for keeping track of how many carbs you eat. Counting carbs is important to keep your blood glucose at a healthy level, especially if you use insulin or take certain oral diabetes medicines. It is important to know how many carbs you can safely have in each meal. This is different for every person. Your dietitian can help you calculate how many carbs you should have at each meal and for each snack. Foods that contain carbs include:  Bread, cereal, rice, pasta, and crackers.  Potatoes and corn.  Peas, beans, and lentils.  Milk and yogurt.  Fruit and juice.  Desserts, such as cakes, cookies, ice cream, and candy. How does alcohol affect me? Alcohol can cause a sudden decrease in blood glucose (hypoglycemia), especially if you use insulin or take certain oral diabetes medicines. Hypoglycemia can be a life-threatening condition. Symptoms of hypoglycemia (sleepiness, dizziness, and confusion) are similar to symptoms of having too much  alcohol. If your health care provider says that alcohol is safe for you, follow these guidelines:  Limit alcohol intake to no more than 1 drink per day for nonpregnant women and 2 drinks per day for men. One drink equals 12 oz of beer, 5 oz of wine, or 1 oz of hard liquor.  Do not drink on an empty stomach.  Keep yourself hydrated with water, diet soda, or unsweetened iced tea.  Keep in mind that regular soda, juice, and other mixers may contain a lot of sugar and must be counted as  carbs. What are tips for following this plan?  Reading food labels  Start by checking the serving size on the "Nutrition Facts" label of packaged foods and drinks. The amount of calories, carbs, fats, and other nutrients listed on the label is based on one serving of the item. Many items contain more than one serving per package.  Check the total grams (g) of carbs in one serving. You can calculate the number of servings of carbs in one serving by dividing the total carbs by 15. For example, if a food has 30 g of total carbs, it would be equal to 2 servings of carbs.  Check the number of grams (g) of saturated and trans fats in one serving. Choose foods that have low or no amount of these fats.  Check the number of milligrams (mg) of salt (sodium) in one serving. Most people should limit total sodium intake to less than 2,300 mg per day.  Always check the nutrition information of foods labeled as "low-fat" or "nonfat". These foods may be higher in added sugar or refined carbs and should be avoided.  Talk to your dietitian to identify your daily goals for nutrients listed on the label. Shopping  Avoid buying canned, premade, or processed foods. These foods tend to be high in fat, sodium, and added sugar.  Shop around the outside edge of the grocery store. This includes fresh fruits and vegetables, bulk grains, fresh meats, and fresh dairy. Cooking  Use low-heat cooking methods, such as baking, instead of high-heat cooking methods like deep frying.  Cook using healthy oils, such as olive, canola, or sunflower oil.  Avoid cooking with butter, cream, or high-fat meats. Meal planning  Eat meals and snacks regularly, preferably at the same times every day. Avoid going long periods of time without eating.  Eat foods high in fiber, such as fresh fruits, vegetables, beans, and whole grains. Talk to your dietitian about how many servings of carbs you can eat at each meal.  Eat 4-6 ounces (oz)  of lean protein each day, such as lean meat, chicken, fish, eggs, or tofu. One oz of lean protein is equal to: ? 1 oz of meat, chicken, or fish. ? 1 egg. ?  cup of tofu.  Eat some foods each day that contain healthy fats, such as avocado, nuts, seeds, and fish. Lifestyle  Check your blood glucose regularly.  Exercise regularly as told by your health care provider. This may include: ? 150 minutes of moderate-intensity or vigorous-intensity exercise each week. This could be brisk walking, biking, or water aerobics. ? Stretching and doing strength exercises, such as yoga or weightlifting, at least 2 times a week.  Take medicines as told by your health care provider.  Do not use any products that contain nicotine or tobacco, such as cigarettes and e-cigarettes. If you need help quitting, ask your health care provider.  Work with a Social worker or diabetes  educator to identify strategies to manage stress and any emotional and social challenges. Questions to ask a health care provider  Do I need to meet with a diabetes educator?  Do I need to meet with a dietitian?  What number can I call if I have questions?  When are the best times to check my blood glucose? Where to find more information:  American Diabetes Association: diabetes.org  Academy of Nutrition and Dietetics: www.eatright.[employer] of Diabetes and Digestive and Kidney Diseases (NIH): CarFlippers.tn Summary  A healthy meal plan will help you control your blood glucose and maintain a healthy lifestyle.  Working with a diet and nutrition specialist (dietitian) can help you make a meal plan that is best for you.  Keep in mind that carbohydrates (carbs) and alcohol have immediate effects on your blood glucose levels. It is important to count carbs and to use alcohol carefully. This information is not intended to replace advice given to you by your health care provider. Make sure you discuss any questions you  have with your health care provider. Document Revised: 04/07/2017 Document Reviewed: 05/30/2016 Elsevier Patient Education  2020 [employer].

## 2019-05-30 NOTE — Discharge Summary (Signed)
Physician Discharge Summary  [name] PPI:[id number] DOB: 1975/06/18 DOA: 05/24/2019  PCP: [name]  Admit date: 05/24/2019 Discharge date: 05/30/2019  Admitted From: Home Disposition: Home  Recommendations for Outpatient Follow-up:  1. Patient will be discharged on oral Decadron for 4 more days to complete 10-day course.  Completed remdesivir on 1/20. 2. Follow-up with PCP in 1 week.  Patient is being discharged on long-acting insulin and premeal Humalog.  Needs close outpatient monitoring and adjustment of his insulin dosing.  Home Health: PT and RN Equipment/Devices: Oxygen (2 L via nasal cannula continuously)  Discharge Condition: Fair CODE STATUS: Full code Diet recommendation: Carb modified    Discharge Diagnoses:  Principal Problem:   COVID-19 virus infection Acute respiratory failure with hypoxia  Active Problems:   Morbid obesity with BMI 43.65 kg/m (HCC)   Essential hypertension   Dyslipidemia   Uncontrolled type 2 diabetes mellitus with hyperglycemia, with long-term current use of insulin (HCC)  Brief narrative/HPI 44 y.o.malewith medical history ofinsulin-dependent type 2 diabetes, lumbar stenosis with neurogenic claudication, and morbid obesity who presented to ED on 1/15 withbody aches, progressiveshortness of breathfor past 3 days.In the ED, febrile 100.5, tachycardic up to 120, no hypoxia. Labs notable for no leukocytosis, negative procalcitonin, negative troponin and BNP. Covid-19 antigen positive. Chest x-ray showed right middle lobe pneumonia.Admitted to hospitalist service, treating with remdesivir, vitamin C, zinc.Patient with persistent fevers, and did develop hypoxia on 1/16.  Now improved and oxygen weaned to 2 L.  Hospital course Principal problem Acute respiratory failure with hypoxia due to COVID-19 infection Completed 5 days of IV remdesivir on 1/20.  Decadron for 5 more days to complete 10-day  course. Oxygen weaned to 2 L and will be discharged on it with titration as outpatient. Continue vitamin C and zinc. Inflammatory markers improving.  Patient continues to show clinical improvement, not in respiratory distress and afebrile.  Cough improving.  Arrange home health RN and PT  Uncontrolled type 2 diabetes mellitus with hyperglycemia Patient was on Moca 60 units at home but has not been taking it for past few months as he lost his insurance.  Was also on Metformin.  A1c of 13.6 and CBG uncontrolled in the 200s-300s.  Started on Levemir 30 units twice daily and added NovoLog 26 units 3 times daily with meals.  CBG elevated due to current steroid use. Patient will now be discharged on Levemir twice daily and NovoLog 3 times a day.  Patient will obtain blood glucose monitoring kit and education provided on tight blood glucose monitoring and close outpatient follow-up.  ??Positive HIV screen HIV ruled out with PCR.  CD4 count  Essential hypertension Continue Lasix and lisinopril.  Morbid obesity with BMI of 43.6/kg/m Counseled on weight loss and exercise  Hyperlipidemia Continue statin  Disposition: Home Family communication: Spoke with mother on the phone  Procedure: None  Discharge Instructions   Allergies as of 05/30/2019      Reactions   Bee Venom Anaphylaxis, Swelling, Other (See Comments)   Swelling primarily at sting site, but can be all over.  Epi pen prn      Medication List    STOP taking these medications   ibuprofen 800 MG tablet Commonly known as: ADVIL   indomethacin 50 MG capsule Commonly known as: INDOCIN   naftifine 1 % cream Commonly known as: NAFTIN   Soliqua 100-33 UNT-MCG/ML Sopn Generic drug: Insulin Glargine-Lixisenatide   terbinafine 250 MG tablet Commonly known as: LamISIL  TAKE these medications   aspirin EC 81 MG tablet Take 81 mg by mouth daily.   Cartia XT 240 MG 24 hr capsule Generic drug: diltiazem Take 240 mg by  mouth daily.   cyclobenzaprine 10 MG tablet Commonly known as: FLEXERIL Take 10 mg by mouth 4 (four) times daily.   diclofenac 75 MG EC tablet Commonly known as: VOLTAREN TAKE 1 TABLET BY MOUTH TWICE DAILY AS NEEDED (TAKE  BETWEEN  MEALS)   Dilt-XR 240 MG 24 hr capsule Generic drug: diltiazem Take 240 mg by mouth daily.   EPINEPHrine 0.3 mg/0.3 mL Soaj injection Commonly known as: EPI-PEN Inject 0.3 mg into the muscle once as needed (For anaphylaxis.).   furosemide 40 MG tablet Commonly known as: LASIX Take 40 mg by mouth daily.   insulin lispro 100 UNIT/ML KwikPen Commonly known as: HumaLOG KwikPen Inject 0.25 mLs (25 Units total) into the skin 3 (three) times daily.   Insulin Pen Needle 32G X 4 MM Misc 1 application by Does not apply route 3 (three) times daily.   Klor-Con M20 20 MEQ tablet Generic drug: potassium chloride SA Take 20 mEq by mouth daily.   Levemir FlexTouch 100 UNIT/ML Pen Generic drug: Insulin Detemir Inject 30 Units into the skin 2 (two) times daily.   lisinopril 20 MG tablet Commonly known as: ZESTRIL Take 20 mg by mouth daily.   metFORMIN 1000 MG tablet Commonly known as: GLUCOPHAGE Take 1,000 mg by mouth 2 (two) times daily with a meal.   predniSONE 20 MG tablet Commonly known as: DELTASONE Take 2 tablets (40 mg total) by mouth daily.   simvastatin 10 MG tablet Commonly known as: ZOCOR Take 10 mg by mouth daily.   vitamin C 250 MG tablet Commonly known as: ASCORBIC ACID Take 1 tablet (250 mg total) by mouth daily.   zinc sulfate 220 (50 Zn) MG capsule Take 1 capsule (220 mg total) by mouth daily.            Durable Medical Equipment  (From admission, onward)         Start     Ordered   05/29/19 1420  For home use only DME oxygen  Once    Question Answer Comment  Length of Need 6 Months   Mode or (Route) Nasal cannula   Liters per Minute 2   Frequency Continuous (stationary and portable oxygen unit needed)   Oxygen  conserving device No   Oxygen delivery system Gas      05/29/19 1420         Follow-up Information    Home, [facility] Follow up.   Specialty: Home Health Services Why: They will follow up with you for your home health physical therapy needs. Contact information: [address] [place] [address] [phone]        [name]. Schedule an appointment as soon as possible for a visit in 1 week(s).   Specialty: Family Medicine Contact information: [place] [id number] [phone]          Allergies  Allergen Reactions  . Bee Venom Anaphylaxis, Swelling and Other (See Comments)    Swelling primarily at sting site, but can be all over.  Epi pen prn        Procedures/Studies: DG Chest 2 View  Result Date: 05/24/2019 CLINICAL DATA:  Cough, shortness of breath and fever since Monday EXAM: CHEST - 2 VIEW COMPARISON:  Radiograph 04/30/2016 FINDINGS:  Consolidation present in the right middle lobe partially silhouetting portion of the right heart border. No pneumothorax. No effusion. No acute osseous or soft tissue abnormality. Degenerative changes are present in the imaged spine and shoulders. IMPRESSION: Right middle lobe pneumonia. Follow-up to resolution is recommended. Electronically Signed   By: Lovena Le M.D.   On: 05/24/2019 17:20   CT ANGIO CHEST PE W OR WO CONTRAST  Result Date: 05/26/2019 CLINICAL DATA:  COVID positive.  Respiratory distress EXAM: CT ANGIOGRAPHY CHEST WITH CONTRAST TECHNIQUE: Multidetector CT imaging of the chest was performed using the standard protocol during bolus administration of intravenous contrast. Multiplanar CT image reconstructions and MIPs were obtained to evaluate the vascular anatomy. CONTRAST:  160m OMNIPAQUE IOHEXOL 350 MG/ML SOLN COMPARISON:  Radiograph 05/24/2019 FINDINGS: Cardiovascular: Evaluation the pulmonary arteries is somewhat limited by dispersion of the  contrast bolus and body habitus as well as some respiratory motion. With this caveat, there are no filling defects within the pulmonary arteries to suggest acute pulmonary embolism. Mediastinum/Nodes: Trachea and esophagus normal. Lungs/Pleura: There is widespread bilateral nodular airspace disease in the upper and lower lobes. A focus consolidation in the RIGHT middle lobe below the horizontal fissure. Mild consolidation in the RIGHT upper lobe. No pleural fluid. Upper Abdomen: Limited view of the liver, kidneys, pancreas are unremarkable. Normal adrenal glands. Musculoskeletal: No aggressive osseous lesion. Review of the MIP images confirms the above findings. IMPRESSION: 1. No evidence acute pulmonary embolism with some limitation as described above. 2. Bilateral widespread nodular airspace disease consistent with COVID viral pneumonia. Mild consolidation in the RIGHT lung. Electronically Signed   By: SSuzy BouchardM.D.   On: 05/26/2019 04:26       Subjective: Feels better.  Maintaining sats on 2 L.  Discharge Exam: Vitals:   05/30/19 0743 05/30/19 1026  BP: 112/78 110/82  Pulse: (!) 102   Resp: 19   Temp: 98 F (36.7 C)   SpO2: 92%    Vitals:   05/30/19 0101 05/30/19 0701 05/30/19 0743 05/30/19 1026  BP:   112/78 110/82  Pulse:   (!) 102   Resp: 20  19   Temp: 98.4 F (36.9 C)  98 F (36.7 C)   TempSrc: Oral  Oral   SpO2:  92% 92%   Weight:      Height:        General: Not in distress HEENT: Moist mucosa, supple neck Chest: Few scattered rhonchi CVs: Normal S1-S2 GI: Soft, nondistended, nontender Musculoskeletal: Warm, no edema    The results of significant diagnostics from this hospitalization (including imaging, microbiology, ancillary and laboratory) are listed below for reference.     Microbiology: Recent Results (from the past 240 hour(s))  CULTURE, BLOOD (ROUTINE X 2) w Reflex to ID Panel     Status: None (Preliminary result)   Collection Time: 05/26/19   1:19 AM   Specimen: BLOOD  Result Value Ref Range Status   Specimen Description BLOOD LEFT ANTECUBITAL  Final   Special Requests   Final    BOTTLES DRAWN AEROBIC AND ANAEROBIC Blood Culture adequate volume   Culture   Final    NO GROWTH 4 DAYS [facility] [address] [place] [address]   Report Status PENDING  Incomplete  CULTURE, BLOOD (ROUTINE X 2) w Reflex to ID Panel     Status: None (Preliminary result)   Collection Time: 05/26/19  1:19 AM   Specimen: BLOOD  Result Value Ref Range  Status   Specimen Description BLOOD LEFT HAND  Final   Special Requests   Final    BOTTLES DRAWN AEROBIC AND ANAEROBIC Blood Culture adequate volume   Culture   Final    NO GROWTH 4 DAYS Performed at [facility], [address] [place], [place] [address]    Report Status PENDING  Incomplete     Labs: BNP (last 3 results) Recent Labs    05/24/19 1626  BNP 29.0   Basic Metabolic Panel: Recent Labs  Lab 05/25/19 0302 05/26/19 0435 05/27/19 0621 05/28/19 0249 05/29/19 0310  NA 133* 132* 137 138 136  K 4.5 4.1 4.5 4.6 4.4  CL 99 99 102 101 100  CO2 '23 23 24 26 28  ' GLUCOSE 464* 273* 310* 363* 295*  BUN 20 21* 22* 26* 29*  CREATININE 1.28* 1.09 1.09 1.04 0.89  CALCIUM 7.9* 7.4* 7.9* 8.2* 8.4*   Liver Function Tests: Recent Labs  Lab 05/25/19 0302 05/26/19 0435 05/27/19 0621 05/28/19 0249 05/29/19 0310  AST 34 34 '26 26 27  ' ALT 40 38 33 32 31  ALKPHOS 73 59 57 60 56  BILITOT 0.7 0.8 0.7 0.7 0.5  PROT 7.3 7.0 7.2 7.2 7.1  ALBUMIN 3.8 3.2* 3.2* 3.2* 3.4*   No results for input(s): LIPASE, AMYLASE in the last 168 hours. No results for input(s): AMMONIA in the last 168 hours. CBC: Recent Labs  Lab 05/26/19 0435 05/27/19 0621 05/28/19 0249 05/29/19 0310  WBC 4.5  4.6 4.4 5.2 6.0  NEUTROABS 2.5  2.4 2.5 3.2 3.5  HGB 12.7*  12.9* 12.4* 12.2* 12.1*  HCT 40.0  38.9 39.7 38.8* 38.4*  MCV 91.5  89 90.8 90.2 90.1  PLT 274   295 309 358 413*   Cardiac Enzymes: No results for input(s): CKTOTAL, CKMB, CKMBINDEX, TROPONINI in the last 168 hours. BNP: Invalid input(s): POCBNP CBG: Recent Labs  Lab 05/29/19 0808 05/29/19 1115 05/29/19 1640 05/29/19 2130 05/30/19 0744  GLUCAP 227* 272* 201* 313* 223*   D-Dimer No results for input(s): DDIMER in the last 72 hours. Hgb A1c No results for input(s): HGBA1C in the last 72 hours. Lipid Profile No results for input(s): CHOL, HDL, LDLCALC, TRIG, CHOLHDL, LDLDIRECT in the last 72 hours. Thyroid function studies No results for input(s): TSH, T4TOTAL, T3FREE, THYROIDAB in the last 72 hours.  Invalid input(s): FREET3 Anemia work up Recent Labs    05/28/19 0249 05/29/19 0310  FERRITIN 246 206   Urinalysis    Component Value Date/Time   COLORURINE YELLOW 07/26/2015 1130   APPEARANCEUR CLEAR 07/26/2015 1130   LABSPEC 1.031 (H) 07/26/2015 1130   PHURINE 5.5 07/26/2015 1130   GLUCOSEU >1000 (A) 07/26/2015 1130   HGBUR NEGATIVE 07/26/2015 1130   BILIRUBINUR NEGATIVE 07/26/2015 1130   KETONESUR NEGATIVE 07/26/2015 1130   PROTEINUR NEGATIVE 07/26/2015 1130   NITRITE NEGATIVE 07/26/2015 1130   LEUKOCYTESUR NEGATIVE 07/26/2015 1130   Sepsis Labs Invalid input(s): PROCALCITONIN,  WBC,  LACTICIDVEN Microbiology Recent Results (from the past 240 hour(s))  CULTURE, BLOOD (ROUTINE X 2) w Reflex to ID Panel     Status: None (Preliminary result)   Collection Time: 05/26/19  1:19 AM   Specimen: BLOOD  Result Value Ref Range Status   Specimen Description BLOOD LEFT ANTECUBITAL  Final   Special Requests   Final    BOTTLES DRAWN AEROBIC AND ANAEROBIC Blood Culture adequate volume   Culture   Final    NO GROWTH 4 DAYS Performed at [facility], [address], [place], [place] [address]    Report Status PENDING  Incomplete  CULTURE, BLOOD (ROUTINE X 2) w Reflex to ID Panel     Status: None (Preliminary result)   Collection Time: 05/26/19  1:19 AM    Specimen: BLOOD  Result Value Ref Range Status   Specimen Description BLOOD LEFT HAND  Final   Special Requests   Final    BOTTLES DRAWN AEROBIC AND ANAEROBIC Blood Culture adequate volume   Culture   Final    NO GROWTH 4 DAYS Performed at [facility], [address], [place], [place] [address]    Report Status PENDING  Incomplete     Time coordinating discharge: 20   SIGNED:   Louellen Molder, MD  [facility] 05/30/2019, 10:31 AM Pager   If 7PM-7AM, please contact night-coverage [email or web address] Password [email or web address]

## 2019-05-31 LAB — CULTURE, BLOOD (ROUTINE X 2)
Culture: NO GROWTH
Culture: NO GROWTH
Special Requests: ADEQUATE
Special Requests: ADEQUATE

## 2019-06-13 ENCOUNTER — Other Ambulatory Visit (INDEPENDENT_AMBULATORY_CARE_PROVIDER_SITE_OTHER): Payer: Self-pay | Admitting: Orthopaedic Surgery

## 2019-07-24 ENCOUNTER — Other Ambulatory Visit: Payer: Self-pay

## 2019-07-24 ENCOUNTER — Ambulatory Visit: Payer: BLUE CROSS/BLUE SHIELD | Admitting: Podiatry

## 2019-07-24 ENCOUNTER — Encounter: Payer: Self-pay | Admitting: Podiatry

## 2019-07-24 DIAGNOSIS — M79676 Pain in unspecified toe(s): Secondary | ICD-10-CM | POA: Diagnosis not present

## 2019-07-24 DIAGNOSIS — B351 Tinea unguium: Secondary | ICD-10-CM

## 2019-07-24 DIAGNOSIS — E119 Type 2 diabetes mellitus without complications: Secondary | ICD-10-CM | POA: Diagnosis not present

## 2019-07-24 MED ORDER — TERBINAFINE HCL 250 MG PO TABS: 250.0000 mg | ORAL_TABLET | Freq: Every day | ORAL | 0 refills | Status: AC

## 2019-07-24 NOTE — Progress Notes (Signed)
He presents today states that he was doing really well with his toenails and the Lamisil until he had to go into the hospital with Covid.  He said they stopped his Lamisil medicine while in the hospital and he restarted it after he got out.  He states that he has been on it now for a couple of weeks and seems to be doing just fine.  He states that the nail looks better as he refers to the hallux left.  He is complaining today of painfully elongated toenails.  Objective: Vital signs are stable alert and oriented x3.  Pulses are palpable.  No neurological side effects toenails are long thick yellow dystrophic.  Assessment: Long-term therapy with Lamisil secondary to onychomycosis.  Plan: Debridement of toenails bilaterally.  We will follow-up with him in about 3 months we will go ahead and run him on another 30 tablets every other day for a total of 60 days.

## 2019-07-27 ENCOUNTER — Other Ambulatory Visit: Payer: Self-pay | Admitting: Orthopaedic Surgery

## 2019-08-01 ENCOUNTER — Telehealth: Payer: Self-pay | Admitting: Pharmacy Technician

## 2019-08-01 NOTE — Telephone Encounter (Signed)
Patient failed to provide 2021 proof of income.  No additional medication assistance will be provided by [facility] without the required proof of income documentation.  Patient notified by letter.  Salvatrice Morandi J. Sophiarose Eades Care Manager [facility]   [address] [place], [place]  [address]     This is to inform you that you are no longer eligible to receive medication assistance at [facility].  The reason(s) are:    _____Your total gross monthly household income exceeds 250% of the Federal Poverty Level.   _____Tangible assets (savings, checking, stocks/bonds, pension, retirement, etc.) exceeds our limit  _____You are eligible to receive benefits from Medicaid, [facility] or HIV Medication              Assistance Program _____You are eligible to receive benefits from a Medicare Part "D" plan _____You have prescription insurance  _____You are not an [place] resident __X__Failure to provide all requested proof of income information for 2021.    Medication assistance will resume once all requested financial information has been returned to our clinic.  If you have questions, please contact our clinic at [phone].    Thank you,  [facility] 

## 2019-09-05 ENCOUNTER — Other Ambulatory Visit: Payer: Self-pay | Admitting: Orthopaedic Surgery

## 2019-10-17 ENCOUNTER — Other Ambulatory Visit (INDEPENDENT_AMBULATORY_CARE_PROVIDER_SITE_OTHER): Payer: Self-pay | Admitting: Orthopaedic Surgery

## 2019-10-17 NOTE — Telephone Encounter (Signed)
Haven't seen him since 2019

## 2019-10-28 ENCOUNTER — Ambulatory Visit: Payer: BLUE CROSS/BLUE SHIELD | Admitting: Podiatry

## 2019-10-31 ENCOUNTER — Other Ambulatory Visit: Payer: Self-pay | Admitting: Orthopaedic Surgery

## 2019-11-05 ENCOUNTER — Other Ambulatory Visit: Payer: Self-pay | Admitting: Podiatry

## 2019-12-10 ENCOUNTER — Other Ambulatory Visit: Payer: Self-pay | Admitting: Orthopaedic Surgery

## 2019-12-11 ENCOUNTER — Ambulatory Visit (INDEPENDENT_AMBULATORY_CARE_PROVIDER_SITE_OTHER): Payer: BC Managed Care – PPO | Admitting: Podiatry

## 2019-12-11 ENCOUNTER — Other Ambulatory Visit: Payer: Self-pay

## 2019-12-11 ENCOUNTER — Encounter: Payer: Self-pay | Admitting: Podiatry

## 2019-12-11 DIAGNOSIS — B351 Tinea unguium: Secondary | ICD-10-CM

## 2019-12-11 DIAGNOSIS — M79676 Pain in unspecified toe(s): Secondary | ICD-10-CM | POA: Diagnosis not present

## 2019-12-11 DIAGNOSIS — L03032 Cellulitis of left toe: Secondary | ICD-10-CM | POA: Diagnosis not present

## 2019-12-11 MED ORDER — DOXYCYCLINE HYCLATE 100 MG PO TABS: 100.0000 mg | ORAL_TABLET | Freq: Two times a day (BID) | ORAL | 0 refills | Status: DC

## 2019-12-11 MED ORDER — NEOMYCIN-POLYMYXIN-HC 1 % OT SOLN: OTIC | 1 refills | Status: DC

## 2019-12-11 NOTE — Progress Notes (Signed)
He presents today after having not seen him for several months with a chief concern of infected nail border fibular hallux left.  He states been puffy and red for the past few days and had some green drainage.  He has been using peroxide and Betadine for treatment.  Objective: Vital signs are stable alert oriented x3.  Pulses are palpable.  Sharp innervated nail margin along the fibular border the hallux left.  Purulence or malodor is present.  Pain in limb secondary to onychomycosis.  Thick yellow dystrophic-like mycotic nails.  Assessment: Diabetes mellitus with peripheral neuropathy ingrown nail paronychia abscess hallux left.  Pain in limb secondary to onychomycosis.  Plan: Incision and drainage along the fibular border of the hallux left today this is performed after local anesthetic was administered.  He tolerated procedure well received a prescription for Cortisporin Otic to be applied twice daily after soaking he also received a prescription for doxycycline.  I will follow-up with him in 2 weeks

## 2019-12-11 NOTE — Patient Instructions (Signed)

## 2019-12-25 ENCOUNTER — Ambulatory Visit: Payer: BC Managed Care – PPO | Admitting: Podiatry

## 2020-01-22 ENCOUNTER — Encounter: Payer: Self-pay | Admitting: Podiatry

## 2020-01-22 ENCOUNTER — Other Ambulatory Visit: Payer: Self-pay

## 2020-01-22 ENCOUNTER — Ambulatory Visit (INDEPENDENT_AMBULATORY_CARE_PROVIDER_SITE_OTHER): Payer: BC Managed Care – PPO | Admitting: Podiatry

## 2020-01-22 DIAGNOSIS — M79676 Pain in unspecified toe(s): Secondary | ICD-10-CM

## 2020-01-22 DIAGNOSIS — E119 Type 2 diabetes mellitus without complications: Secondary | ICD-10-CM

## 2020-01-22 DIAGNOSIS — B351 Tinea unguium: Secondary | ICD-10-CM

## 2020-01-22 NOTE — Progress Notes (Signed)
He presents today chief complaint of a painful elongated toenails.  He is also for follow-up of a mild paronychia that he had to the left hallux.  Objective: There is no erythema edema cellulitis drainage or odor at this point.  His nails are long thick yellow dystrophic-like mycotic.  Assessment: Pain in limb secondary onychomycosis.  Plan: Debrided all reactive hyperkeratotic tissue debrided toenails 1 through 5 bilaterally follow-up with him as needed.

## 2020-01-24 ENCOUNTER — Other Ambulatory Visit: Payer: Self-pay | Admitting: Orthopaedic Surgery

## 2020-03-02 ENCOUNTER — Other Ambulatory Visit (INDEPENDENT_AMBULATORY_CARE_PROVIDER_SITE_OTHER): Payer: Self-pay | Admitting: Orthopaedic Surgery

## 2020-03-04 ENCOUNTER — Other Ambulatory Visit: Payer: Self-pay | Admitting: Orthopaedic Surgery

## 2020-03-04 ENCOUNTER — Ambulatory Visit: Payer: BC Managed Care – PPO | Admitting: Podiatry

## 2020-04-25 ENCOUNTER — Other Ambulatory Visit: Payer: Self-pay | Admitting: Orthopaedic Surgery

## 2020-04-27 ENCOUNTER — Ambulatory Visit: Payer: BC Managed Care – PPO | Attending: Neurology

## 2020-04-27 DIAGNOSIS — I10 Essential (primary) hypertension: Secondary | ICD-10-CM | POA: Diagnosis not present

## 2020-04-27 DIAGNOSIS — G4733 Obstructive sleep apnea (adult) (pediatric): Secondary | ICD-10-CM | POA: Insufficient documentation

## 2020-04-28 ENCOUNTER — Other Ambulatory Visit: Payer: Self-pay

## 2020-06-18 ENCOUNTER — Other Ambulatory Visit: Payer: Self-pay | Admitting: Orthopaedic Surgery

## 2020-06-28 ENCOUNTER — Other Ambulatory Visit (INDEPENDENT_AMBULATORY_CARE_PROVIDER_SITE_OTHER): Payer: Self-pay | Admitting: Orthopaedic Surgery

## 2020-08-04 ENCOUNTER — Other Ambulatory Visit: Payer: Self-pay | Admitting: Orthopaedic Surgery

## 2020-08-04 NOTE — Telephone Encounter (Signed)
Refill ok? He hasn't been here since 2019

## 2020-10-25 ENCOUNTER — Other Ambulatory Visit: Payer: Self-pay | Admitting: Orthopaedic Surgery

## 2020-11-20 ENCOUNTER — Other Ambulatory Visit: Payer: Self-pay | Admitting: Orthopaedic Surgery

## 2020-11-20 NOTE — Telephone Encounter (Signed)
ok 

## 2021-02-09 ENCOUNTER — Other Ambulatory Visit (INDEPENDENT_AMBULATORY_CARE_PROVIDER_SITE_OTHER): Payer: Self-pay | Admitting: Orthopaedic Surgery

## 2021-03-13 ENCOUNTER — Other Ambulatory Visit: Payer: Self-pay | Admitting: Orthopaedic Surgery

## 2021-05-15 ENCOUNTER — Other Ambulatory Visit (INDEPENDENT_AMBULATORY_CARE_PROVIDER_SITE_OTHER): Payer: Self-pay | Admitting: Orthopaedic Surgery

## 2021-05-23 ENCOUNTER — Other Ambulatory Visit: Payer: Self-pay | Admitting: Orthopaedic Surgery

## 2021-07-16 ENCOUNTER — Other Ambulatory Visit: Payer: Self-pay | Admitting: Orthopaedic Surgery

## 2021-07-28 ENCOUNTER — Other Ambulatory Visit: Payer: Self-pay | Admitting: Neurology

## 2021-07-28 DIAGNOSIS — I82492 Acute embolism and thrombosis of other specified deep vein of left lower extremity: Secondary | ICD-10-CM

## 2021-07-28 DIAGNOSIS — Z872 Personal history of diseases of the skin and subcutaneous tissue: Secondary | ICD-10-CM

## 2021-07-28 DIAGNOSIS — R200 Anesthesia of skin: Secondary | ICD-10-CM

## 2021-09-13 ENCOUNTER — Other Ambulatory Visit: Payer: Self-pay | Admitting: Orthopaedic Surgery

## 2021-11-03 ENCOUNTER — Other Ambulatory Visit: Payer: Self-pay | Admitting: Orthopaedic Surgery

## 2021-11-12 ENCOUNTER — Other Ambulatory Visit: Payer: Self-pay | Admitting: Orthopaedic Surgery

## 2021-12-08 ENCOUNTER — Other Ambulatory Visit: Payer: Self-pay | Admitting: Orthopaedic Surgery

## 2022-01-19 ENCOUNTER — Other Ambulatory Visit: Payer: Self-pay | Admitting: Orthopaedic Surgery

## 2022-02-27 ENCOUNTER — Other Ambulatory Visit: Payer: Self-pay | Admitting: Orthopaedic Surgery

## 2022-06-12 ENCOUNTER — Other Ambulatory Visit: Payer: Self-pay | Admitting: Orthopaedic Surgery

## 2022-06-26 ENCOUNTER — Other Ambulatory Visit: Payer: Self-pay | Admitting: Orthopaedic Surgery

## 2022-08-01 ENCOUNTER — Other Ambulatory Visit: Payer: Self-pay | Admitting: Orthopaedic Surgery

## 2022-08-17 ENCOUNTER — Other Ambulatory Visit: Payer: Self-pay | Admitting: Orthopaedic Surgery

## 2022-09-11 ENCOUNTER — Other Ambulatory Visit: Payer: Self-pay | Admitting: Physician Assistant

## 2022-10-17 ENCOUNTER — Other Ambulatory Visit: Payer: Self-pay | Admitting: Physician Assistant

## 2022-10-19 DIAGNOSIS — E1169 Type 2 diabetes mellitus with other specified complication: Secondary | ICD-10-CM | POA: Diagnosis not present

## 2022-10-19 DIAGNOSIS — Z794 Long term (current) use of insulin: Secondary | ICD-10-CM | POA: Diagnosis not present

## 2022-10-19 DIAGNOSIS — E119 Type 2 diabetes mellitus without complications: Secondary | ICD-10-CM | POA: Diagnosis not present

## 2022-11-12 ENCOUNTER — Other Ambulatory Visit: Payer: Self-pay | Admitting: Orthopaedic Surgery

## 2022-11-24 ENCOUNTER — Other Ambulatory Visit: Payer: Self-pay | Admitting: Orthopaedic Surgery

## 2023-01-04 ENCOUNTER — Other Ambulatory Visit: Payer: Self-pay | Admitting: Orthopaedic Surgery

## 2023-01-05 ENCOUNTER — Other Ambulatory Visit: Payer: Self-pay | Admitting: Orthopaedic Surgery

## 2023-01-08 ENCOUNTER — Other Ambulatory Visit: Payer: Self-pay | Admitting: Orthopaedic Surgery

## 2023-01-11 ENCOUNTER — Other Ambulatory Visit: Payer: Self-pay | Admitting: Orthopaedic Surgery

## 2023-01-12 ENCOUNTER — Other Ambulatory Visit: Payer: Self-pay | Admitting: Physician Assistant

## 2023-01-12 MED ORDER — DICLOFENAC SODIUM 75 MG PO TBEC: DELAYED_RELEASE_TABLET | ORAL | 1 refills | Status: AC

## 2023-02-21 ENCOUNTER — Other Ambulatory Visit: Payer: Self-pay | Admitting: Physician Assistant

## 2023-03-26 ENCOUNTER — Other Ambulatory Visit: Payer: Self-pay | Admitting: Physician Assistant

## 2023-05-16 ENCOUNTER — Other Ambulatory Visit: Payer: Self-pay | Admitting: Orthopaedic Surgery

## 2023-05-22 ENCOUNTER — Telehealth: Payer: Self-pay | Admitting: Orthopaedic Surgery

## 2023-05-22 NOTE — Telephone Encounter (Signed)
 Patient called advised needing Rx refilled Diclofenac. Patient said he went to the pharmacy and the Rx was denied and he was asked to contact his provider. The number to contact patient is [phone]

## 2023-05-22 NOTE — Telephone Encounter (Signed)
 Patient aware we couldn't fill that because it has been 5 years since we have seen him

## 2024-03-11 ENCOUNTER — Encounter: Payer: Self-pay | Admitting: Radiology
